# Patient Record
Sex: Male | Born: 1935
Health system: Southern US, Community
[De-identification: ages and names within clinical notes are randomized; demographics above are authoritative.]

## PROBLEM LIST (undated history)

## (undated) DIAGNOSIS — I10 Essential (primary) hypertension: Secondary | ICD-10-CM

## (undated) DIAGNOSIS — D649 Anemia, unspecified: Secondary | ICD-10-CM

## (undated) DIAGNOSIS — E785 Hyperlipidemia, unspecified: Secondary | ICD-10-CM

## (undated) DIAGNOSIS — Z8546 Personal history of malignant neoplasm of prostate: Secondary | ICD-10-CM

## (undated) DIAGNOSIS — I493 Ventricular premature depolarization: Secondary | ICD-10-CM

## (undated) HISTORY — DX: Hyperlipidemia, unspecified: E78.5

## (undated) HISTORY — DX: Ventricular premature depolarization: I49.3

## (undated) HISTORY — DX: Essential (primary) hypertension: I10

## (undated) HISTORY — DX: Personal history of malignant neoplasm of prostate: Z85.46

## (undated) HISTORY — PX: PROSTATE SURGERY: SHX751

## (undated) HISTORY — PX: HERNIA REPAIR: SHX51

---

## 1992-01-14 DIAGNOSIS — Z8546 Personal history of malignant neoplasm of prostate: Secondary | ICD-10-CM

## 1992-01-14 HISTORY — DX: Personal history of malignant neoplasm of prostate: Z85.46

## 2002-03-24 ENCOUNTER — Ambulatory Visit (HOSPITAL_COMMUNITY): Admission: RE | Admit: 2002-03-24 | Discharge: 2002-03-24 | Payer: Self-pay | Admitting: Gastroenterology

## 2002-03-24 ENCOUNTER — Encounter (INDEPENDENT_AMBULATORY_CARE_PROVIDER_SITE_OTHER): Payer: Self-pay | Admitting: *Deleted

## 2003-05-24 ENCOUNTER — Ambulatory Visit (HOSPITAL_COMMUNITY): Admission: RE | Admit: 2003-05-24 | Discharge: 2003-05-24 | Payer: Self-pay | Admitting: Family Medicine

## 2003-08-16 ENCOUNTER — Ambulatory Visit (HOSPITAL_COMMUNITY): Admission: RE | Admit: 2003-08-16 | Discharge: 2003-08-16 | Payer: Self-pay | Admitting: Family Medicine

## 2004-02-26 ENCOUNTER — Ambulatory Visit (HOSPITAL_COMMUNITY): Admission: RE | Admit: 2004-02-26 | Discharge: 2004-02-26 | Payer: Self-pay | Admitting: Family Medicine

## 2004-08-27 ENCOUNTER — Ambulatory Visit (HOSPITAL_COMMUNITY): Admission: RE | Admit: 2004-08-27 | Discharge: 2004-08-27 | Payer: Self-pay | Admitting: Family Medicine

## 2005-10-03 ENCOUNTER — Ambulatory Visit (HOSPITAL_COMMUNITY): Admission: RE | Admit: 2005-10-03 | Discharge: 2005-10-03 | Payer: Self-pay | Admitting: Family Medicine

## 2007-08-25 ENCOUNTER — Ambulatory Visit: Payer: Self-pay | Admitting: Cardiology

## 2008-01-21 ENCOUNTER — Encounter: Payer: Self-pay | Admitting: Internal Medicine

## 2010-02-03 ENCOUNTER — Encounter: Payer: Self-pay | Admitting: Family Medicine

## 2010-05-28 NOTE — Assessment & Plan Note (Signed)
[facility]                            CARDIOLOGY OFFICE NOTE   NAME:[place], [place]                      MRN:          [id number]  DATE:08/25/2007                            DOB:          10-01-1928    PRIMARY CARE PHYSICIAN:  Ernestina Penna, MD   REASON FOR PRESENTATION:  Evaluate the patient with premature  ventricular contractions.   HISTORY OF PRESENT ILLNESS:  The patient is a very pleasant 77 year old  gentleman who looks much younger than his stated age.  He has had no  significant cardiac history in the past.  He has had stress exercise  tests, which have been normal.  He walks a mile every day.  He remains  active, pushing a Surveyor, mining.  With all of these activities, he denies  any chest pressure, neck discomfort, arm discomfort, activity-induced  nausea, vomiting, or excessive diaphoresis.  He has no palpitations,  presyncope, or syncope.  He has had no PND or orthopnea.   He was noted recently on EKG had premature ventricular contractions.  This was a routine EKG, and he has not have any symptoms which related  to this.   PAST MEDICAL HISTORY:  Hypertension x25 years, and hyperlipidemia.   PAST SURGICAL HISTORY:  Prostate cancer in 1994.   ALLERGIES:  None.   MEDICATIONS:  1. Lipitor 10 mg daily.  2. Amlodipine/benazepril 5/20.  3. Hydrochlorothiazide 25 mg daily.  4. TriCor 48 mg daily.  5. Vitamin D.  6. Aspirin 81 mg every other day.   SOCIAL HISTORY:  The patient is married.  He has 2 children.  He is  retired.  He does not smoke cigarettes.  He does not drink alcohol.   FAMILY HISTORY:  Noncontributory for early coronary artery disease.   REVIEW OF SYSTEMS:  As stated in the HPI and positive for some joint  pain.  Negative for all other systems.   PHYSICAL EXAMINATION:  GENERAL:  The patient is well-appearing and in no  distress.  He is pleasant.  VITAL SIGNS:  Blood pressure 140/80, heart rate 70 and regular,  weight  171 pounds, and body mass index 23.  HEENT:  Eyes are unremarkable.  Pupils are equal, round, and reactive to  light.  Fundi not visualized.  Oral mucosa unremarkable.  NECK:  No jugular venous distention at 45 degrees.  Carotid upstroke  brisk and symmetrical.  No bruits.  No thyromegaly.  LYMPHATICS:  No cervical, axillary, or inguinal adenopathy.  LUNGS:  Clear to auscultation bilaterally.  BACK:  No costovertebral angle tenderness.  CHEST:  Unremarkable.  HEART:  PMI not displaced or sustained.  S1 and S2 within normal limits.  No S3.  No S4.  No clicks.  No rubs.  No murmurs.  ABDOMEN:  Flat.  Positive bowel sounds.  Normal in frequency and pitch.  No bruits.  No rebound.  No guarding.  No midline pulsatile mass.  No  hepatomegaly.  No splenomegaly.  SKIN:  No rashes.  No nodules.  EXTREMITIES:  2+ pulses throughout.  No edema.  No cyanosis.  No  clubbing.  NEURO:  Oriented to person, place, and time.  Cranial nerves II through  XII are grossly intact.  Motor grossly intact.   EKG:  Sinus rhythm, left axis deviation, left anterior fascicular block.  Incomplete right bundle-branch block.  No acute ST-T wave changes.   ASSESSMENT AND PLAN:  1. Premature ventricular contractions.  The patient has premature      ventricular contractions documented on previous EKGs.  However, he      is not having any symptoms related to this.  At this point, no      further cardiovascular testing is suggested.  2. Hypertension.  Blood pressure is well controlled and he will      continue the medications as listed.  3. Dyslipidemia.  He is followed closely by Dr. Christell Constant.  4. Followup.  I will see this patient back as needed based on future      symptoms.     Rollene Rotunda, MD, [facility]  Electronically Signed    JH/MedQ  DD: 08/25/2007  DT: 08/26/2007  Job #: [id number]   cc:   Ernestina Penna, M.D.

## 2010-05-31 NOTE — Op Note (Signed)
   Benjamin [place]anan, [place] Buchanan                         ACCOUNT NO.:  [id number]   MEDICAL RECORD NO.:  [id number]                   PATIENT TYPE:  AMB   LOCATION:  ENDO                                 FACILITY:  [facility]   PHYSICIAN:  John C. Madilyn Fireman, M.D.                 DATE OF BIRTH:  1928/03/28   DATE OF PROCEDURE:  03/24/2002  DATE OF DISCHARGE:                                 OPERATIVE REPORT   PROCEDURE:  Colonoscopy.   INDICATIONS FOR PROCEDURE:  Colon cancer screening.   DESCRIPTION OF PROCEDURE:  The patient was placed in the left lateral  decubitus position then placed on the pulse monitor with continuous low flow  oxygen delivered by nasal cannula. He was sedated with 100 mcg IV fentanyl  and 8 mg IV Versed. The Olympus video colonoscope was inserted into the  rectum and advanced to the cecum, confirmed by transillumination at  McBurney's point and visualization of the ileocecal valve and appendiceal  orifice. The prep was excellent. In the base of the cecum, there was an 8 mm  sessile polyp which was removed by snare. The remainder of the cecum,  ascending, transverse, descending and sigmoid colon all appeared normal with  no masses, polyps, diverticula or other mucosal abnormalities. The rectum  likewise appeared normal and retroflexed view of the anus revealed no  obvious internal hemorrhoids. The colonoscope was then withdrawn and the  patient returned to the recovery room in stable condition. The patient  tolerated the procedure well and there were no immediate complications.   IMPRESSION:  Cecal polyp, otherwise, normal colonoscopy.   PLAN:  Await biopsy results to determine method and interval for future  colon screening.                                               John C. Madilyn Fireman, M.D.    JCH/MEDQ  D:  03/24/2002  T:  03/24/2002  Job:  [id number]   cc:   Ernestina Penna, M.D.  [address] [place]  [place] [address]  Fax: [phone]

## 2010-12-12 ENCOUNTER — Other Ambulatory Visit: Payer: Self-pay | Admitting: Gastroenterology

## 2012-01-16 ENCOUNTER — Encounter: Payer: Self-pay | Admitting: *Deleted

## 2012-01-20 ENCOUNTER — Encounter: Payer: Self-pay | Admitting: Cardiology

## 2012-01-20 ENCOUNTER — Ambulatory Visit (INDEPENDENT_AMBULATORY_CARE_PROVIDER_SITE_OTHER): Payer: Medicare Other | Admitting: Cardiology

## 2012-01-20 VITALS — BP 124/74 | HR 70 | Ht 71.0 in | Wt 162.0 lb

## 2012-01-20 DIAGNOSIS — E782 Mixed hyperlipidemia: Secondary | ICD-10-CM | POA: Insufficient documentation

## 2012-01-20 DIAGNOSIS — I493 Ventricular premature depolarization: Secondary | ICD-10-CM | POA: Insufficient documentation

## 2012-01-20 DIAGNOSIS — I10 Essential (primary) hypertension: Secondary | ICD-10-CM

## 2012-01-20 DIAGNOSIS — E785 Hyperlipidemia, unspecified: Secondary | ICD-10-CM

## 2012-01-20 DIAGNOSIS — Z9189 Other specified personal risk factors, not elsewhere classified: Secondary | ICD-10-CM

## 2012-01-20 DIAGNOSIS — I4949 Other premature depolarization: Secondary | ICD-10-CM

## 2012-01-20 NOTE — Patient Instructions (Addendum)
Your physician recommends that you have  a FASTING lipid profile/BMET. Please fax the results to Dr Shirlee Latch [phone].   Your physician wants you to follow-up in: 1-2 years with Dr Shirlee Latch. You will receive a reminder letter in the mail two months in advance. If you don't receive a letter, please call our office to schedule the follow-up appointment.

## 2012-01-20 NOTE — Progress Notes (Signed)
[name], male   DOB: 03-Dec-1928, 79 y.o.   MRN: [id number] PCP: Dr. Christell Constant  79 yo with history of HTN and hyperlipidemia presents for cardiac risk evaluation.  Patient had a prior cardiac evaluation about 4 years ago for PVCs that were determined to be probably benign.  He has had no recent palpitations, lightheadedness, or syncope.  He is active, walking daily for exercise.  No exertional dyspnea or chest pain.  He can walk up and down the stairs to his basement without any problems.  He is on benazepril and HCTZ for HTN.  His BP has been under good control at home (brings BP numbers from the last couple of weeks at home).  He is on lovastatin and will get lipids done with Dr. Christell Constant in a couple of weeks.  He takes aspirin 3 times a week because of excessive bruising when he takes it daily.   ECG: NSR, normal  PMH: 1. PVCs 2. HTN 3. Hyperlipidemia 4. Prostate cancer in 1994  SH: Married, 2 children, nonsmoker.  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  Lives in [place].   FH: No CAD.  Mother with Alzheimers and father with prostate cancer .  ROS: All systems reviewed and negative as per HPI.   Current Outpatient Prescriptions  Medication Sig Dispense Refill  . amLODipine (NORVASC) 5 MG tablet Take 1 tablet by mouth Daily.      Marland Kitchen aspirin 81 MG tablet Take 81 mg by mouth. Monday, Wednesday, Friday      . benazepril (LOTENSIN) 20 MG tablet Take 1 tablet by mouth Daily.      . Cholecalciferol 1000 UNITS capsule Take 1,000 Units by mouth daily.      . fenofibrate 54 MG tablet Take 1 tablet by mouth Daily.      . fish oil-omega-3 fatty acids 1000 MG capsule Take 1 g by mouth daily.      . hydrochlorothiazide (HYDRODIURIL) 25 MG tablet Take 1 tablet by mouth Daily.      Marland Kitchen lovastatin (MEVACOR) 20 MG tablet Take 1 tablet by mouth Daily.      . vitamin C (ASCORBIC ACID) 500 MG tablet Take 500 mg by mouth daily.       BP 124/74  Pulse 70  Ht 5\' 11"  (1.803 m)   Wt 162 lb (73.483 kg)  BMI 22.59 kg/m2 General: NAD Neck: No JVD, no thyromegaly or thyroid nodule.  Lungs: Clear to auscultation bilaterally with normal respiratory effort. CV: Nondisplaced PMI.  Heart regular S1/S2, no S3/S4, no murmur.  No peripheral edema.  No carotid bruit.  Normal pedal pulses.  Abdomen: Soft, nontender, no hepatosplenomegaly, no distention.  Skin: Intact without lesions or rashes.  Neurologic: Alert and oriented x 3.  Psych: Normal affect. Extremities: No clubbing or cyanosis.  HEENT: Normal.   Assessment/Plan: 1. HTN: BP is under good control.  Continue current regimen.  2. Hyperlipidemia: Will ask that a copy of labs be sent to [place] when he gets them at Dr. Kathi Der office later this month.  3. PVCs: Normal ECG today.  No palpitations.  4. Cardiovascular risk evaluation: No ischemic symptoms.  He seems to be doing well overall.  I asked him to try to take ASA more often, he will up it to 4 times a week.  He can followup in 1-2 years or as needed.   Marca Ancona 01/20/2012 3:55 PM

## 2012-02-06 ENCOUNTER — Encounter: Payer: Self-pay | Admitting: Cardiology

## 2012-02-09 ENCOUNTER — Telehealth: Payer: Self-pay | Admitting: Cardiology

## 2012-02-09 NOTE — Telephone Encounter (Signed)
°  New Problem      Returning phone call

## 2012-02-09 NOTE — Telephone Encounter (Signed)
Spoke with pt about recent lab

## 2012-04-01 ENCOUNTER — Other Ambulatory Visit: Payer: Self-pay | Admitting: Family Medicine

## 2012-09-02 ENCOUNTER — Other Ambulatory Visit (INDEPENDENT_AMBULATORY_CARE_PROVIDER_SITE_OTHER): Payer: Medicare Other

## 2012-09-02 DIAGNOSIS — I10 Essential (primary) hypertension: Secondary | ICD-10-CM

## 2012-09-02 DIAGNOSIS — E559 Vitamin D deficiency, unspecified: Secondary | ICD-10-CM

## 2012-09-02 DIAGNOSIS — R5381 Other malaise: Secondary | ICD-10-CM

## 2012-09-02 DIAGNOSIS — E785 Hyperlipidemia, unspecified: Secondary | ICD-10-CM

## 2012-09-02 LAB — POCT CBC
Granulocyte percent: 67.7 %G (ref 37–80)
Hemoglobin: 13.3 g/dL — AB (ref 14.1–18.1)
MCH, POC: 30.8 pg (ref 27–31.2)
POC Granulocyte: 4.5 (ref 2–6.9)
POC LYMPH PERCENT: 26.3 %L (ref 10–50)
RBC: 4.3 M/uL — AB (ref 4.69–6.13)

## 2012-09-02 NOTE — Progress Notes (Signed)
Patient here today for labs only. °

## 2012-09-03 LAB — HEPATIC FUNCTION PANEL
Albumin: 4.4 g/dL (ref 3.5–4.7)
Total Bilirubin: 0.9 mg/dL (ref 0.0–1.2)
Total Protein: 6.4 g/dL (ref 6.0–8.5)

## 2012-09-03 LAB — NMR, LIPOPROFILE
LDL Size: 20.8 nm (ref >20.5)
LDLC SERPL CALC-MCNC: 60 mg/dL (ref <100)
LP-IR Score: 56 — ABNORMAL HIGH (ref <=45)
Triglycerides by NMR: 106 mg/dL (ref <150)

## 2012-09-03 LAB — BMP8+EGFR
BUN: 21 mg/dL (ref 8–27)
Creatinine, Ser: 1.52 mg/dL — ABNORMAL HIGH (ref 0.76–1.27)
GFR calc Af Amer: 49 mL/min/{1.73_m2} — ABNORMAL LOW (ref >59)
GFR calc non Af Amer: 42 mL/min/{1.73_m2} — ABNORMAL LOW (ref >59)
Glucose: 96 mg/dL (ref 65–99)

## 2012-09-06 ENCOUNTER — Ambulatory Visit (INDEPENDENT_AMBULATORY_CARE_PROVIDER_SITE_OTHER): Payer: Medicare Other | Admitting: Family Medicine

## 2012-09-06 ENCOUNTER — Encounter: Payer: Self-pay | Admitting: Family Medicine

## 2012-09-06 VITALS — BP 133/71 | HR 70 | Temp 97.2°F | Ht 70.5 in | Wt 161.2 lb

## 2012-09-06 DIAGNOSIS — B351 Tinea unguium: Secondary | ICD-10-CM

## 2012-09-06 DIAGNOSIS — J309 Allergic rhinitis, unspecified: Secondary | ICD-10-CM

## 2012-09-06 DIAGNOSIS — E785 Hyperlipidemia, unspecified: Secondary | ICD-10-CM

## 2012-09-06 DIAGNOSIS — I10 Essential (primary) hypertension: Secondary | ICD-10-CM

## 2012-09-06 DIAGNOSIS — Z8546 Personal history of malignant neoplasm of prostate: Secondary | ICD-10-CM

## 2012-09-06 DIAGNOSIS — E559 Vitamin D deficiency, unspecified: Secondary | ICD-10-CM

## 2012-09-06 DIAGNOSIS — R799 Abnormal finding of blood chemistry, unspecified: Secondary | ICD-10-CM

## 2012-09-06 DIAGNOSIS — R7989 Other specified abnormal findings of blood chemistry: Secondary | ICD-10-CM

## 2012-09-06 NOTE — Patient Instructions (Addendum)
Fall precautions discussed Continue current meds and therapeutic lifestyle changes Return to clinic in September or October for a flu shot Drink plenty of fluids and keep well hydrated Continue to walk regularly as this will help arthritic complaints Take Tylenol as needed for arthritis pain Wear protection for nasal passages and throat when working outside Use cortisone cream over-the-counter for rash on left arm

## 2012-09-06 NOTE — Progress Notes (Signed)
Subjective:    [name], male    DOB: Aug 16, 1928, 79 y.o.   MRN: [id number]  HPI Patient comes in today for followup and management of chronic medical problems. He has a history of hypertension hyperlipidemia and vitamin D deficiency. He also has a history of remote prostate cancer. Outside blood pressures were reviewed and all these are excellent. Blood pressures are running from 110-118/60-70. He is also up-to-date on all of his health maintenance issues. See also the review of systems. His recent labs were reviewed.   Review of Systems  Constitutional: Negative.  Negative for activity change, appetite change and fatigue.  HENT: Negative.  Negative for ear pain, congestion, sore throat, rhinorrhea, sneezing and tinnitus.   Eyes: Negative.  Negative for pain, redness, itching and visual disturbance.  Respiratory: Positive for cough (dry in the AM). Negative for choking, chest tightness and wheezing.   Cardiovascular: Negative.  Negative for chest pain, palpitations and leg swelling.  Gastrointestinal: Negative.  Negative for nausea, vomiting, abdominal pain, constipation and blood in stool.  Endocrine: Negative.  Negative for cold intolerance, heat intolerance, polydipsia, polyphagia and polyuria.  Genitourinary: Negative for dysuria, urgency, frequency and hematuria.  Musculoskeletal: Positive for arthralgias (L hand). Negative for myalgias and back pain.  Skin: Negative for color change, pallor, rash and wound.  Allergic/Immunologic: Negative.  Negative for environmental allergies.  Neurological: Negative for dizziness, tremors, weakness, light-headedness, numbness and headaches.  Psychiatric/Behavioral: Positive for decreased concentration (slight memory deficit). Negative for confusion, sleep disturbance and agitation. The patient is not nervous/anxious.        Objective:   Physical Exam BP 133/71  Pulse 70  Temp(Src) 97.2 F (36.2 C) (Oral)  Ht 5' 10.5" (1.791  m)  Wt 161 lb 3.2 oz (73.12 kg)  BMI 22.8 kg/m2  The patient appeared well nourished and normally developed for his age, alert and oriented to time and place. Speech, behavior and judgement appear normal. Vital signs as documented.  Head exam is unremarkable. No scleral icterus or pallor noted. There is some nasal congestion bilaterally. There was minimal cerumen in both ear canals. The eardrums appeared normal. The mouth and throat were normal. Neck is without jugular venous distension, thyromegally, or carotid bruits. Carotid upstrokes are brisk bilaterally. No cervical adenopathy. Lungs are clear anteriorly and posteriorly to auscultation. Normal respiratory effort. Cardiac exam reveals regular rate and rhythm at 72 per minute. First and second heart sounds normal.  No murmurs, rubs or gallops. There were no PVCs. Abdominal exam reveals normal bowl sounds, no masses, no organomegaly and no aortic enlargement. No inguinal adenopathy. Extremities are nonedematous and both femoral and pedal pulses are normal. There were a lot of varicosities around the ankles. Skin without pallor or jaundice.  Warm and dry, without rash. He had a toenail fungus. There was erythema and redness around the lateral epicondyles the left elbow. Neurologic exam reveals normal deep tendon reflexes and normal sensation.          Assessment & Plan:  1. Hypertension  2. Hyperlipemia  3. Vitamin D deficiency  4. History of prostate cancer  5. Elevated serum creatinine  6. Nail fungus  7. Allergic rhinitis  Patient Instructions  Fall precautions discussed Continue current meds and therapeutic lifestyle changes Return to clinic in September or October for a flu shot Drink plenty of fluids and keep well hydrated Continue to walk regularly as this will help arthritic complaints Take Tylenol as needed for arthritis pain Wear protection  for nasal passages and throat when working outside Use cortisone cream  over-the-counter for rash on left arm   Nyra Capes MD

## 2012-09-26 ENCOUNTER — Other Ambulatory Visit: Payer: Self-pay | Admitting: Family Medicine

## 2012-09-27 NOTE — Telephone Encounter (Signed)
Last seen 03/08/12  MMM

## 2012-10-07 ENCOUNTER — Other Ambulatory Visit: Payer: Self-pay | Admitting: Nurse Practitioner

## 2012-11-03 ENCOUNTER — Other Ambulatory Visit: Payer: Self-pay | Admitting: Family Medicine

## 2012-11-05 ENCOUNTER — Other Ambulatory Visit: Payer: Self-pay | Admitting: Nurse Practitioner

## 2012-11-16 ENCOUNTER — Ambulatory Visit (INDEPENDENT_AMBULATORY_CARE_PROVIDER_SITE_OTHER): Payer: Medicare Other | Admitting: Family Medicine

## 2012-11-16 VITALS — BP 135/73 | HR 79 | Temp 96.5°F | Ht 70.5 in | Wt 161.0 lb

## 2012-11-16 DIAGNOSIS — J069 Acute upper respiratory infection, unspecified: Secondary | ICD-10-CM

## 2012-11-16 MED ORDER — AZITHROMYCIN 250 MG PO TABS: ORAL_TABLET | ORAL | Status: DC

## 2012-11-16 MED ORDER — HYDROCODONE-HOMATROPINE 5-1.5 MG/5ML PO SYRP: 5.0000 mL | ORAL_SOLUTION | Freq: Three times a day (TID) | ORAL | Status: DC | PRN

## 2012-11-16 NOTE — Patient Instructions (Signed)

## 2012-11-16 NOTE — Progress Notes (Signed)
  Subjective:    Patient ID: CURTIS CAIN, male    DOB: 1928/12/05, 79 y.o.   MRN: [id number]  HPI This 79 y.o. male presents for evaluation of cough and uri sx's for the last few days..  Review of Systems    No chest pain, SOB, HA, dizziness, vision change, N/V, diarrhea, constipation, dysuria, urinary urgency or frequency, myalgias, arthralgias or rash.  Objective:   Physical Exam Vital signs noted  Well developed well nourished male.  HEENT - Head atraumatic Normocephalic                Eyes - PERRLA, Conjuctiva - clear Sclera- Clear EOMI                Ears - EAC's Wnl TM's Wnl Gross Hearing WNL                Nose - Nares patent                 Throat - oropharanx wnl Respiratory - Lungs CTA bilateral Cardiac - RRR S1 and S2 without murmur GI - Abdomen soft Nontender and bowel sounds active x 4 Extremities - No edema. Neuro - Grossly intact.       Assessment & Plan:  URI (upper respiratory infection) - Plan: azithromycin (ZITHROMAX) 250 MG tablet, HYDROcodone-homatropine (HYCODAN) 5-1.5 MG/5ML syrup Push po fluids, rest, and follow up prn.  Deatra Canter FNP

## 2012-12-14 ENCOUNTER — Ambulatory Visit (INDEPENDENT_AMBULATORY_CARE_PROVIDER_SITE_OTHER): Payer: Medicare Other | Admitting: *Deleted

## 2012-12-14 DIAGNOSIS — Z23 Encounter for immunization: Secondary | ICD-10-CM

## 2012-12-15 ENCOUNTER — Other Ambulatory Visit: Payer: Self-pay | Admitting: *Deleted

## 2012-12-15 DIAGNOSIS — I493 Ventricular premature depolarization: Secondary | ICD-10-CM

## 2012-12-15 DIAGNOSIS — Z9189 Other specified personal risk factors, not elsewhere classified: Secondary | ICD-10-CM

## 2012-12-15 DIAGNOSIS — I10 Essential (primary) hypertension: Secondary | ICD-10-CM

## 2012-12-15 DIAGNOSIS — E785 Hyperlipidemia, unspecified: Secondary | ICD-10-CM

## 2012-12-15 MED ORDER — FENOFIBRATE 54 MG PO TABS: 54.0000 mg | ORAL_TABLET | Freq: Every day | ORAL | Status: DC

## 2012-12-23 ENCOUNTER — Other Ambulatory Visit: Payer: Self-pay | Admitting: Family Medicine

## 2013-01-27 ENCOUNTER — Other Ambulatory Visit: Payer: Self-pay | Admitting: Family Medicine

## 2013-02-10 ENCOUNTER — Encounter: Payer: Self-pay | Admitting: Cardiology

## 2013-02-10 ENCOUNTER — Ambulatory Visit (INDEPENDENT_AMBULATORY_CARE_PROVIDER_SITE_OTHER): Payer: Medicare Other | Admitting: Cardiology

## 2013-02-10 VITALS — BP 130/70 | HR 66 | Ht 70.5 in | Wt 160.0 lb

## 2013-02-10 DIAGNOSIS — E785 Hyperlipidemia, unspecified: Secondary | ICD-10-CM

## 2013-02-10 DIAGNOSIS — Z9189 Other specified personal risk factors, not elsewhere classified: Secondary | ICD-10-CM

## 2013-02-10 DIAGNOSIS — I4949 Other premature depolarization: Secondary | ICD-10-CM

## 2013-02-10 DIAGNOSIS — I493 Ventricular premature depolarization: Secondary | ICD-10-CM

## 2013-02-10 DIAGNOSIS — I10 Essential (primary) hypertension: Secondary | ICD-10-CM

## 2013-02-10 NOTE — Patient Instructions (Signed)
Your physician wants you to follow-up in: 1 year with Dr McLean. (January 2016).  You will receive a reminder letter in the mail two months in advance. If you don't receive a letter, please call our office to schedule the follow-up appointment.  

## 2013-02-10 NOTE — Progress Notes (Signed)
[name], male   DOB: 09-21-1928, 80 y.o.   MRN: [id number] PCP: Dr. Laurance Flatten  80 yo with history of HTN, PVCs, and hyperlipidemia presents for cardiac followup.  He has had no recent palpitations, lightheadedness, or syncope.  He is active, walking daily for exercise.  No exertional dyspnea or chest pain.  He can walk up and down the stairs to his basement without any problems.  He is on benazepril and HCTZ for HTN.  His BP has been under good control.  He takes aspirin 4 times a week because of excessive bruising when he takes it daily. Weight is down 2 lbs compared to last year.   ECG: NSR, 1st degree AV block, iRBBB  Labs (8/14): LDL-P 1160, LDL 60, K 4.6, creatinine 1.5  PMH: 1. PVCs 2. HTN 3. Hyperlipidemia 4. Prostate cancer in 1994 5. CKD  SH: Married, 2 children, nonsmoker.  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  [place]ives in [place].   FH: No CAD.  Mother with Alzheimers and father with prostate cancer .  Current Outpatient Prescriptions  Medication Sig Dispense Refill  . amLODipine (NORVASC) 5 MG tablet TAKE 1 TABLET BY MOUTH ONCE A DAY  30 tablet  3  . aspirin 81 MG tablet Take 81 mg by mouth. Monday, Wednesday, Friday      . azithromycin (ZITHROMAX) 250 MG tablet Take 2 po first day and then one po qd x 4 days  6 tablet  0  . benazepril (LOTENSIN) 20 MG tablet TAKE 1 TABLET EVERY DAY  30 tablet  3  . Cholecalciferol 1000 UNITS capsule Take 1,000 Units by mouth daily.      . fenofibrate 54 MG tablet Take 1 tablet (54 mg total) by mouth daily.  30 tablet  2  . fish oil-omega-3 fatty acids 1000 MG capsule Take 1 g by mouth daily.      . hydrochlorothiazide (HYDRODIURIL) 25 MG tablet TAKE 1 TABLET BY MOUTH ONCE A DAY  30 tablet  1  . HYDROcodone-homatropine (HYCODAN) 5-1.5 MG/5ML syrup Take 5 mLs by mouth every 8 (eight) hours as needed for cough.  120 mL  0  . lovastatin (MEVACOR) 20 MG tablet TAKE 1 TABLET NOW, THEN TAKE 1 TABLET BY MOUTH ONCE  DAILY AS DIRECTED  30 tablet  1  . vitamin C (ASCORBIC ACID) 500 MG tablet Take 500 mg by mouth daily.       No current facility-administered medications for this visit.   BP 130/70  Pulse 66  Ht 5' 10.5" (1.791 m)  Wt 72.576 kg (160 lb)  BMI 22.63 kg/m2 General: NAD Neck: No JVD, no thyromegaly or thyroid nodule.  Lungs: Clear to auscultation bilaterally with normal respiratory effort. CV: Nondisplaced PMI.  Heart regular S1/S2, no S3/S4, no murmur.  No peripheral edema.  No carotid bruit.  Normal pedal pulses.  Abdomen: Soft, nontender, no hepatosplenomegaly, no distention.  Skin: Intact without lesions or rashes.  Neurologic: Alert and oriented x 3.  Psych: Normal affect. Extremities: No clubbing or cyanosis.   Assessment/Plan: 1. HTN: BP is under good control.  Continue current regimen.  2. Hyperlipidemia: Good lipids in 8/14. 3. PVCs:  No palpitations.  ECG today unremarkable.  4. Cardiovascular risk evaluation: No ischemic symptoms.  He seems to be doing well overall.  He will continue ASA 81 mg 4 x/week.  He can followup in 1 year or as needed.  5. CKD: Will be getting BMET  with physical in 2/15.    [name] 02/10/2013

## 2013-02-19 ENCOUNTER — Other Ambulatory Visit: Payer: Self-pay | Admitting: Family Medicine

## 2013-02-21 NOTE — Telephone Encounter (Signed)
Last seen 11/16/12 B [place]   Last lipid 09/02/12

## 2013-02-23 ENCOUNTER — Other Ambulatory Visit: Payer: Self-pay | Admitting: Family Medicine

## 2013-03-18 ENCOUNTER — Other Ambulatory Visit: Payer: Self-pay | Admitting: Family Medicine

## 2013-03-21 NOTE — Telephone Encounter (Signed)
Last seen 11/16/12  JWO  Last lipid 09/02/12

## 2013-03-22 ENCOUNTER — Other Ambulatory Visit: Payer: Self-pay | Admitting: Family Medicine

## 2013-03-31 ENCOUNTER — Other Ambulatory Visit: Payer: Self-pay | Admitting: Family Medicine

## 2013-04-07 ENCOUNTER — Other Ambulatory Visit: Payer: Self-pay | Admitting: Family Medicine

## 2013-05-18 ENCOUNTER — Encounter: Payer: Self-pay | Admitting: General Practice

## 2013-05-18 ENCOUNTER — Ambulatory Visit (INDEPENDENT_AMBULATORY_CARE_PROVIDER_SITE_OTHER): Payer: Medicare Other | Admitting: General Practice

## 2013-05-18 VITALS — BP 121/68 | HR 75 | Temp 97.5°F | Ht 70.5 in | Wt 161.8 lb

## 2013-05-18 DIAGNOSIS — L259 Unspecified contact dermatitis, unspecified cause: Secondary | ICD-10-CM

## 2013-05-18 MED ORDER — TRIAMCINOLONE ACETONIDE 0.1 % EX CREA
1.0000 | TOPICAL_CREAM | Freq: Two times a day (BID) | CUTANEOUS | Status: AC
Start: 2013-05-18 — End: 2013-05-25

## 2013-05-18 NOTE — Progress Notes (Signed)
   Subjective:    [name], male    DOB: [birth year], 80 y.o.   MRN: [id number]  Rash This is a new problem. The current episode started in the past 7 days. The problem has been gradually worsening since onset. The affected locations include the right hand and right arm. The rash is characterized by redness and itchiness. He was exposed to plant contact. Pertinent negatives include no congestion, cough, diarrhea, fever, shortness of breath or vomiting. Past treatments include anti-itch cream. The treatment provided mild relief. There is no history of allergies, asthma or eczema.      Review of Systems  Constitutional: Negative for fever and chills.  HENT: Negative for congestion.   Respiratory: Negative for cough, chest tightness and shortness of breath.   Cardiovascular: Negative for chest pain and palpitations.  Gastrointestinal: Negative for vomiting and diarrhea.  Skin: Positive for rash.       Red itchy rash to right hand and right forearm       Objective:   Physical Exam  Constitutional: He is oriented to person, place, and time. He appears well-developed and well-nourished.  Cardiovascular: Normal rate, regular rhythm and normal heart sounds.   Pulmonary/Chest: Effort normal and breath sounds normal. No respiratory distress. He exhibits no tenderness.  Neurological: He is alert and oriented to person, place, and time.  Skin: Skin is warm and dry. Rash noted. There is erythema.  Maculopapular rash with well-demarcated lines noted to right hand between index and middle finger. Also to right lower arm.   Psychiatric: He has a normal mood and affect.          Assessment & Plan:  1. Contact dermatitis - triamcinolone cream (KENALOG) 0.1 %; Apply 1 application topically 2 (two) times daily.  Dispense: 30 g; Refill: 0 -patient education provided and discussed -avoid irritants -RTO prn  -Patient verbalized understanding Erby Pian, FNP-C

## 2013-05-18 NOTE — Patient Instructions (Signed)

## 2013-05-29 ENCOUNTER — Other Ambulatory Visit: Payer: Self-pay | Admitting: Family Medicine

## 2013-05-30 NOTE — Telephone Encounter (Signed)
Last seen 08/2012 

## 2013-06-13 ENCOUNTER — Other Ambulatory Visit: Payer: Self-pay | Admitting: Family Medicine

## 2013-06-14 NOTE — Telephone Encounter (Signed)
Last seen you 08/2012

## 2013-07-18 ENCOUNTER — Other Ambulatory Visit (INDEPENDENT_AMBULATORY_CARE_PROVIDER_SITE_OTHER): Payer: Medicare Other

## 2013-07-18 DIAGNOSIS — E785 Hyperlipidemia, unspecified: Secondary | ICD-10-CM

## 2013-07-18 DIAGNOSIS — E559 Vitamin D deficiency, unspecified: Secondary | ICD-10-CM

## 2013-07-18 DIAGNOSIS — I10 Essential (primary) hypertension: Secondary | ICD-10-CM

## 2013-07-18 LAB — POCT CBC
GRANULOCYTE PERCENT: 70.8 % (ref 37–80)
HCT, POC: 37.3 % — AB (ref 43.5–53.7)
Hemoglobin: 12.3 g/dL — AB (ref 14.1–18.1)
LYMPH, POC: 1.8 (ref 0.6–3.4)
MCH, POC: 30.4 pg (ref 27–31.2)
MCHC: 32.8 g/dL (ref 31.8–35.4)
MCV: 92.6 fL (ref 80–97)
MPV: 8.5 fL (ref 0–99.8)
PLATELET COUNT, POC: 269 10*3/uL (ref 142–424)
POC GRANULOCYTE: 4.9 (ref 2–6.9)
POC LYMPH PERCENT: 25.6 %L (ref 10–50)
RBC: 4 M/uL — AB (ref 4.69–6.13)
RDW, POC: 12 %
WBC: 6.9 10*3/uL (ref 4.6–10.2)

## 2013-07-19 ENCOUNTER — Ambulatory Visit (INDEPENDENT_AMBULATORY_CARE_PROVIDER_SITE_OTHER): Payer: Medicare Other | Admitting: Family Medicine

## 2013-07-19 ENCOUNTER — Encounter: Payer: Self-pay | Admitting: Family Medicine

## 2013-07-19 ENCOUNTER — Other Ambulatory Visit: Payer: Self-pay | Admitting: Family Medicine

## 2013-07-19 VITALS — BP 125/63 | HR 71 | Temp 96.9°F | Ht 70.5 in | Wt 160.6 lb

## 2013-07-19 DIAGNOSIS — R7989 Other specified abnormal findings of blood chemistry: Secondary | ICD-10-CM

## 2013-07-19 DIAGNOSIS — M79609 Pain in unspecified limb: Secondary | ICD-10-CM

## 2013-07-19 DIAGNOSIS — M79669 Pain in unspecified lower leg: Secondary | ICD-10-CM

## 2013-07-19 DIAGNOSIS — R350 Frequency of micturition: Secondary | ICD-10-CM

## 2013-07-19 DIAGNOSIS — E785 Hyperlipidemia, unspecified: Secondary | ICD-10-CM

## 2013-07-19 DIAGNOSIS — Z8546 Personal history of malignant neoplasm of prostate: Secondary | ICD-10-CM

## 2013-07-19 DIAGNOSIS — I10 Essential (primary) hypertension: Secondary | ICD-10-CM

## 2013-07-19 DIAGNOSIS — H6123 Impacted cerumen, bilateral: Secondary | ICD-10-CM

## 2013-07-19 DIAGNOSIS — Z23 Encounter for immunization: Secondary | ICD-10-CM

## 2013-07-19 DIAGNOSIS — R710 Precipitous drop in hematocrit: Secondary | ICD-10-CM

## 2013-07-19 DIAGNOSIS — R799 Abnormal finding of blood chemistry, unspecified: Secondary | ICD-10-CM

## 2013-07-19 DIAGNOSIS — H6120 Impacted cerumen, unspecified ear: Secondary | ICD-10-CM

## 2013-07-19 LAB — BMP8+EGFR
BUN/Creatinine Ratio: 16 (ref 10–22)
BUN: 21 mg/dL (ref 8–27)
CALCIUM: 9.8 mg/dL (ref 8.6–10.2)
CHLORIDE: 103 mmol/L (ref 97–108)
CO2: 25 mmol/L (ref 18–29)
Creatinine, Ser: 1.34 mg/dL — ABNORMAL HIGH (ref 0.76–1.27)
GFR calc Af Amer: 56 mL/min/{1.73_m2} — ABNORMAL LOW (ref >59)
GFR calc non Af Amer: 49 mL/min/{1.73_m2} — ABNORMAL LOW (ref >59)
GLUCOSE: 96 mg/dL (ref 65–99)
POTASSIUM: 3.8 mmol/L (ref 3.5–5.2)
Sodium: 142 mmol/L (ref 134–144)

## 2013-07-19 LAB — POCT UA - MICROSCOPIC ONLY
Bacteria, U Microscopic: NEGATIVE
Casts, Ur, LPF, POC: NEGATIVE
Crystals, Ur, HPF, POC: NEGATIVE
Epithelial cells, urine per micros: NEGATIVE
Mucus, UA: NEGATIVE
RBC, URINE, MICROSCOPIC: NEGATIVE
WBC, UR, HPF, POC: NEGATIVE
YEAST UA: NEGATIVE

## 2013-07-19 LAB — POCT URINALYSIS DIPSTICK
Bilirubin, UA: NEGATIVE
Blood, UA: NEGATIVE
GLUCOSE UA: NEGATIVE
Ketones, UA: NEGATIVE
LEUKOCYTES UA: NEGATIVE
NITRITE UA: NEGATIVE
PROTEIN UA: NEGATIVE
Spec Grav, UA: 1.01
UROBILINOGEN UA: NEGATIVE
pH, UA: 5

## 2013-07-19 LAB — VITAMIN D 25 HYDROXY (VIT D DEFICIENCY, FRACTURES): Vit D, 25-Hydroxy: 37.8 ng/mL (ref 30.0–100.0)

## 2013-07-19 LAB — POCT CBC
GRANULOCYTE PERCENT: 68.8 % (ref 37–80)
HEMATOCRIT: 37.5 % — AB (ref 43.5–53.7)
Hemoglobin: 12.5 g/dL — AB (ref 14.1–18.1)
LYMPH, POC: 1.9 (ref 0.6–3.4)
MCH, POC: 31.2 pg (ref 27–31.2)
MCHC: 33.4 g/dL (ref 31.8–35.4)
MCV: 93.5 fL (ref 80–97)
MPV: 7.4 fL (ref 0–99.8)
PLATELET COUNT, POC: 294 10*3/uL (ref 142–424)
POC GRANULOCYTE: 5.5 (ref 2–6.9)
POC LYMPH PERCENT: 24 %L (ref 10–50)
RBC: 4 M/uL — AB (ref 4.69–6.13)
RDW, POC: 12.3 %
WBC: 8 10*3/uL (ref 4.6–10.2)

## 2013-07-19 LAB — NMR, LIPOPROFILE
CHOLESTEROL: 163 mg/dL (ref 100–199)
HDL Cholesterol by NMR: 49 mg/dL (ref >39)
HDL Particle Number: 37.1 umol/L (ref >=30.5)
LDL Particle Number: 982 nmol/L (ref <1000)
LDL SIZE: 21 nm (ref >20.5)
LDLC SERPL CALC-MCNC: 90 mg/dL (ref 0–99)
LP-IR SCORE: 51 — AB (ref <=45)
Small LDL Particle Number: 398 nmol/L (ref <=527)
TRIGLYCERIDES BY NMR: 122 mg/dL (ref 0–149)

## 2013-07-19 LAB — HEPATIC FUNCTION PANEL
ALBUMIN: 4.4 g/dL (ref 3.5–4.7)
ALT: 13 IU/L (ref 0–44)
AST: 19 IU/L (ref 0–40)
Alkaline Phosphatase: 43 IU/L (ref 39–117)
Bilirubin, Direct: 0.15 mg/dL (ref 0.00–0.40)
Total Bilirubin: 0.5 mg/dL (ref 0.0–1.2)
Total Protein: 6.2 g/dL (ref 6.0–8.5)

## 2013-07-19 NOTE — Addendum Note (Signed)
Addended by: Selmer Dominion on: 07/19/2013 02:24 PM   Modules accepted: Orders

## 2013-07-19 NOTE — Progress Notes (Signed)
Pt notified of labs at appt 

## 2013-07-19 NOTE — Progress Notes (Signed)
Subjective:    [name], male    DOB: [birth year], 80 y.o.   MRN: [id number]  HPI Pt is here today for a follow up of chronic medical problems to include hypertension and hyperlipidemia.  The patient is for his he brings in his home blood pressure readings and all of these were good and I will be scanned into the record. His are some slight fatigue and calf pain.  only complaintPrevnar vaccine and he will receive that today. He was also given FOBT to return.        Patient Active Problem List   Diagnosis Date Noted  . History of prostate cancer 09/06/2012  . HTN (hypertension) 01/20/2012  . Hyperlipidemia 01/20/2012  . Cardiovascular risk factor 01/20/2012  . PVC's (premature ventricular contractions) 01/20/2012   Outpatient Encounter Prescriptions as of 07/19/2013  Medication Sig  . amLODipine (NORVASC) 5 MG tablet TAKE 1 TABLET BY MOUTH EVERY DAY  . aspirin 81 MG tablet Take 81 mg by mouth. Monday, Wednesday, Friday  . benazepril (LOTENSIN) 20 MG tablet TAKE 1 TABLET EVERY DAY  . Cholecalciferol 1000 UNITS capsule Take 1,000 Units by mouth daily.  . fenofibrate 54 MG tablet TAKE 1 TABLET (54 MG TOTAL) BY MOUTH DAILY.  . fish oil-omega-3 fatty acids 1000 MG capsule Take 1 g by mouth daily.  . hydrochlorothiazide (HYDRODIURIL) 25 MG tablet TAKE 1 TABLET BY MOUTH EVERY DAY  . vitamin C (ASCORBIC ACID) 500 MG tablet Take 500 mg by mouth daily.  Marland Kitchen lovastatin (MEVACOR) 20 MG tablet TAKE 1 TABLET BY MOUTH EVERY DAY AS DIRECTED  . [DISCONTINUED] fenofibrate 54 MG tablet TAKE 1 TABLET (54 MG TOTAL) BY MOUTH DAILY.     Review of Systems  Constitutional: Positive for fatigue (slight).  HENT: Negative for congestion and postnasal drip.   Eyes: Negative.   Respiratory: Negative for cough and shortness of breath.   Cardiovascular: Negative.   Genitourinary: Negative.   Musculoskeletal: Positive for myalgias (bilateral calf pain).  Psychiatric/Behavioral: Negative.         Objective:   Physical Exam  Nursing note and vitals reviewed. Constitutional: He is oriented to person, place, and time. He appears well-developed and well-nourished. No distress.  HENT:  Head: Normocephalic and atraumatic.  Nose: Nose normal.  Mouth/Throat: Oropharynx is clear and moist. No oropharyngeal exudate.  Bilateral ear cerumen  Eyes: Conjunctivae and EOM are normal. Pupils are equal, round, and reactive to light. Right eye exhibits no discharge. Left eye exhibits no discharge. No scleral icterus.  Neck: Normal range of motion. Neck supple. No thyromegaly present.  Active history of cataracts  Cardiovascular: Normal rate, regular rhythm, normal heart sounds and intact distal pulses.  Exam reveals no gallop and no friction rub.   No murmur heard. At 72 per minute  Pulmonary/Chest: Effort normal and breath sounds normal. No respiratory distress. He has no wheezes. He has no rales. He exhibits no tenderness.  No axillary nodes  Abdominal: Soft. Bowel sounds are normal. He exhibits no mass. There is no tenderness. There is no rebound and no guarding.  No inguinal nodes  Genitourinary: Rectum normal and penis normal.  There were no inguinal hernias. There are no inguinal nodes. The rectal exam was negative for masses. The prostate vault was empty. This is secondary to his prostatectomy. The external genitalia were normal.  Musculoskeletal: Normal range of motion. He exhibits no edema and no tenderness.  Lymphadenopathy:    He has no cervical adenopathy.  Neurological: He is alert and oriented to person, place, and time. He has normal reflexes. No cranial nerve deficit.  Skin: Skin is warm and dry. No rash noted. No erythema. No pallor.  Psychiatric: He has a normal mood and affect. His behavior is normal. Judgment and thought content normal.   BP 125/63  Pulse 71  Temp(Src) 96.9 F (36.1 C) (Oral)  Ht 5' 10.5" (1.791 m)  Wt 160 lb 9.6 oz (72.848 kg)  BMI 22.71  kg/m2         Assessment & Plan:  1. Need for vaccination - Pneumococcal conjugate vaccine 13-valent  2. Drop in hemoglobin - POCT CBC; Future  3. Urinary frequency - POCT urinalysis dipstick - POCT UA - Microscopic Only  4. Impacted cerumen of both ears -Debrox eardrops over-the-counter as directed  5. History of prostate cancer -No more PSAs  6. Elevated serum creatinine -Continue to avoid NSAIDs  7. Essential hypertension -Continue current medication  8. Hyperlipemia -Due to calf pain patient will hold lovastatin for 4 weeks, he will then restart it on Monday Wednesday and Friday  9. Calf pain, unspecified laterality   Patient Instructions  Continue current medications. Continue good therapeutic lifestyle changes which include good diet and exercise. Fall precautions discussed with patient. If an FOBT was given today- please return it to our front desk. If you are over 51 years old - you may need Prevnar 48 or the adult Pneumonia vaccine. Use Debrox ear wax softener OTC Stop taking the Lovastatin for 4 weeks then restart taking on Monday, Wednesday and Friday. Take Tylenol for arthritis pain as needed.   Arrie Senate MD

## 2013-07-19 NOTE — Patient Instructions (Addendum)
Continue current medications. Continue good therapeutic lifestyle changes which include good diet and exercise. Fall precautions discussed with patient. If an FOBT was given today- please return it to our front desk. If you are over 78 years old - you may need Prevnar 68 or the adult Pneumonia vaccine. Use Debrox ear wax softener OTC Stop taking the Lovastatin for 4 weeks then restart taking on Monday, Wednesday and Friday. Take Tylenol for arthritis pain as needed.

## 2013-07-20 ENCOUNTER — Telehealth: Payer: Self-pay | Admitting: Family Medicine

## 2013-07-20 NOTE — Telephone Encounter (Signed)
Message copied by Waverly Ferrari on Wed Jul 20, 2013 11:38 AM ------      Message from: Chipper Herb      Created: Tue Jul 19, 2013  5:35 PM       The CBC had a normal white blood cell count. The hemoglobin was 12.5 and this is consistent with the reading from one day ago. He should still have a CBC repeated in 4 weeks.       he must return  the FOBT ------

## 2013-07-21 ENCOUNTER — Other Ambulatory Visit: Payer: Medicare Other

## 2013-07-21 DIAGNOSIS — Z1212 Encounter for screening for malignant neoplasm of rectum: Secondary | ICD-10-CM

## 2013-07-22 LAB — FECAL OCCULT BLOOD, IMMUNOCHEMICAL: FECAL OCCULT BLD: NEGATIVE

## 2013-08-02 ENCOUNTER — Other Ambulatory Visit: Payer: Self-pay | Admitting: Family Medicine

## 2013-08-08 ENCOUNTER — Encounter: Payer: Self-pay | Admitting: *Deleted

## 2013-08-15 ENCOUNTER — Other Ambulatory Visit: Payer: Self-pay | Admitting: Family Medicine

## 2013-08-16 ENCOUNTER — Other Ambulatory Visit: Payer: Self-pay | Admitting: Family Medicine

## 2013-08-16 ENCOUNTER — Other Ambulatory Visit (INDEPENDENT_AMBULATORY_CARE_PROVIDER_SITE_OTHER): Payer: Medicare Other

## 2013-08-16 DIAGNOSIS — R7989 Other specified abnormal findings of blood chemistry: Secondary | ICD-10-CM

## 2013-08-16 LAB — POCT CBC
Granulocyte percent: 71.2 %G (ref 37–80)
HCT, POC: 36.8 % — AB (ref 43.5–53.7)
Hemoglobin: 12.5 g/dL — AB (ref 14.1–18.1)
Lymph, poc: 1.6 (ref 0.6–3.4)
MCH, POC: 31.5 pg — AB (ref 27–31.2)
MCHC: 34.1 g/dL (ref 31.8–35.4)
MCV: 92.3 fL (ref 80–97)
MPV: 8.4 fL (ref 0–99.8)
POC Granulocyte: 4.6 (ref 2–6.9)
POC LYMPH PERCENT: 25.3 %L (ref 10–50)
Platelet Count, POC: 243 10*3/uL (ref 142–424)
RBC: 4 M/uL — AB (ref 4.69–6.13)
RDW, POC: 12 %
WBC: 6.5 10*3/uL (ref 4.6–10.2)

## 2013-08-25 ENCOUNTER — Telehealth: Payer: Self-pay | Admitting: *Deleted

## 2013-08-25 NOTE — Telephone Encounter (Signed)
Please discontinue the lovastatin. Wait 4 weeks and try Crestor 5 mg.------- please give samples for patient to try .

## 2013-08-25 NOTE — Telephone Encounter (Signed)
We are all out of samples Dr.Moore would you like for me to call this in?

## 2013-08-25 NOTE — Telephone Encounter (Signed)
Have the patient check with [place] in about 4 weeks and see if we have some samples at that time. If we do not we can call a prescription for him in then

## 2013-08-25 NOTE — Telephone Encounter (Signed)
Pt here with complaints of worsening myalgias due to Lovastatin Pt would like to try Crestor Please advise

## 2013-08-26 NOTE — Telephone Encounter (Signed)
Pt notified of Dr Moore's recommendation Verbalizes understanding 

## 2013-09-15 ENCOUNTER — Telehealth: Payer: Self-pay | Admitting: Family Medicine

## 2013-09-16 NOTE — Telephone Encounter (Signed)
Patient aware that no samples available at this time. He will check back next week.

## 2013-09-23 ENCOUNTER — Telehealth: Payer: Self-pay | Admitting: Family Medicine

## 2013-09-23 NOTE — Telephone Encounter (Signed)
labwork not due at this time. Patient notified.

## 2013-10-08 ENCOUNTER — Other Ambulatory Visit: Payer: Self-pay | Admitting: Family Medicine

## 2013-10-11 ENCOUNTER — Other Ambulatory Visit: Payer: Self-pay | Admitting: *Deleted

## 2013-10-11 MED ORDER — BENAZEPRIL HCL 20 MG PO TABS: ORAL_TABLET | ORAL | Status: DC

## 2013-10-12 ENCOUNTER — Other Ambulatory Visit: Payer: Self-pay | Admitting: Family Medicine

## 2013-11-01 ENCOUNTER — Ambulatory Visit (INDEPENDENT_AMBULATORY_CARE_PROVIDER_SITE_OTHER): Payer: Medicare Other

## 2013-11-01 DIAGNOSIS — Z23 Encounter for immunization: Secondary | ICD-10-CM

## 2014-01-08 ENCOUNTER — Other Ambulatory Visit: Payer: Self-pay | Admitting: Family Medicine

## 2014-01-18 ENCOUNTER — Other Ambulatory Visit (INDEPENDENT_AMBULATORY_CARE_PROVIDER_SITE_OTHER): Payer: Medicare HMO

## 2014-01-18 DIAGNOSIS — R710 Precipitous drop in hematocrit: Secondary | ICD-10-CM

## 2014-01-18 DIAGNOSIS — E559 Vitamin D deficiency, unspecified: Secondary | ICD-10-CM

## 2014-01-18 DIAGNOSIS — I10 Essential (primary) hypertension: Secondary | ICD-10-CM

## 2014-01-18 DIAGNOSIS — R5383 Other fatigue: Secondary | ICD-10-CM

## 2014-01-18 DIAGNOSIS — E785 Hyperlipidemia, unspecified: Secondary | ICD-10-CM

## 2014-01-18 LAB — POCT CBC
Granulocyte percent: 64.5 %G (ref 37–80)
HEMATOCRIT: 42.6 % — AB (ref 43.5–53.7)
HEMOGLOBIN: 13.1 g/dL — AB (ref 14.1–18.1)
Lymph, poc: 2.4 (ref 0.6–3.4)
MCH, POC: 28.6 pg (ref 27–31.2)
MCHC: 30.8 g/dL — AB (ref 31.8–35.4)
MCV: 92.7 fL (ref 80–97)
MPV: 8.4 fL (ref 0–99.8)
POC Granulocyte: 4.8 (ref 2–6.9)
POC LYMPH PERCENT: 31.4 %L (ref 10–50)
Platelet Count, POC: 282 10*3/uL (ref 142–424)
RBC: 4.6 M/uL — AB (ref 4.69–6.13)
RDW, POC: 12.5 %
WBC: 7.5 10*3/uL (ref 4.6–10.2)

## 2014-01-18 NOTE — Progress Notes (Signed)
Lab only 

## 2014-01-19 ENCOUNTER — Ambulatory Visit: Payer: Medicare Other | Admitting: Family Medicine

## 2014-01-19 LAB — NMR, LIPOPROFILE
Cholesterol: 126 mg/dL (ref 100–199)
HDL Cholesterol by NMR: 56 mg/dL (ref >39)
HDL Particle Number: 39.3 umol/L (ref >=30.5)
LDL Particle Number: 788 nmol/L (ref <1000)
LDL SIZE: 20.5 nm (ref >20.5)
LDL-C: 52 mg/dL (ref 0–99)
LP-IR Score: 71 — ABNORMAL HIGH (ref <=45)
Small LDL Particle Number: 454 nmol/L (ref <=527)
Triglycerides by NMR: 90 mg/dL (ref 0–149)

## 2014-01-19 LAB — BMP8+EGFR
BUN / CREAT RATIO: 14 (ref 10–22)
BUN: 20 mg/dL (ref 8–27)
CO2: 26 mmol/L (ref 18–29)
Calcium: 9.9 mg/dL (ref 8.6–10.2)
Chloride: 103 mmol/L (ref 97–108)
Creatinine, Ser: 1.42 mg/dL — ABNORMAL HIGH (ref 0.76–1.27)
GFR calc Af Amer: 52 mL/min/{1.73_m2} — ABNORMAL LOW (ref >59)
GFR, EST NON AFRICAN AMERICAN: 45 mL/min/{1.73_m2} — AB (ref >59)
Glucose: 103 mg/dL — ABNORMAL HIGH (ref 65–99)
Potassium: 3.9 mmol/L (ref 3.5–5.2)
SODIUM: 145 mmol/L — AB (ref 134–144)

## 2014-01-19 LAB — HEPATIC FUNCTION PANEL
ALT: 11 IU/L (ref 0–44)
AST: 17 IU/L (ref 0–40)
Albumin: 4.5 g/dL (ref 3.5–4.7)
Alkaline Phosphatase: 46 IU/L (ref 39–117)
BILIRUBIN DIRECT: 0.19 mg/dL (ref 0.00–0.40)
BILIRUBIN TOTAL: 0.7 mg/dL (ref 0.0–1.2)
Total Protein: 6.6 g/dL (ref 6.0–8.5)

## 2014-01-19 LAB — VITAMIN D 25 HYDROXY (VIT D DEFICIENCY, FRACTURES): VIT D 25 HYDROXY: 41.4 ng/mL (ref 30.0–100.0)

## 2014-01-29 ENCOUNTER — Other Ambulatory Visit: Payer: Self-pay | Admitting: Family Medicine

## 2014-02-04 ENCOUNTER — Other Ambulatory Visit: Payer: Self-pay | Admitting: Family Medicine

## 2014-02-06 ENCOUNTER — Other Ambulatory Visit: Payer: Self-pay | Admitting: Family Medicine

## 2014-02-07 ENCOUNTER — Encounter: Payer: Self-pay | Admitting: Family Medicine

## 2014-02-07 ENCOUNTER — Ambulatory Visit (INDEPENDENT_AMBULATORY_CARE_PROVIDER_SITE_OTHER): Payer: Medicare HMO

## 2014-02-07 ENCOUNTER — Ambulatory Visit (INDEPENDENT_AMBULATORY_CARE_PROVIDER_SITE_OTHER): Payer: Medicare HMO | Admitting: Family Medicine

## 2014-02-07 VITALS — BP 110/76 | HR 67 | Temp 96.9°F | Ht 70.5 in | Wt 153.0 lb

## 2014-02-07 DIAGNOSIS — R7989 Other specified abnormal findings of blood chemistry: Secondary | ICD-10-CM

## 2014-02-07 DIAGNOSIS — I10 Essential (primary) hypertension: Secondary | ICD-10-CM

## 2014-02-07 DIAGNOSIS — I700 Atherosclerosis of aorta: Secondary | ICD-10-CM

## 2014-02-07 DIAGNOSIS — R748 Abnormal levels of other serum enzymes: Secondary | ICD-10-CM

## 2014-02-07 DIAGNOSIS — E785 Hyperlipidemia, unspecified: Secondary | ICD-10-CM

## 2014-02-07 DIAGNOSIS — E559 Vitamin D deficiency, unspecified: Secondary | ICD-10-CM

## 2014-02-07 DIAGNOSIS — Z8546 Personal history of malignant neoplasm of prostate: Secondary | ICD-10-CM

## 2014-02-07 NOTE — Patient Instructions (Addendum)
Medicare Annual Wellness Visit  [facility] and the medical providers at [facility] strive to bring you the best medical care.  In doing so we not only want to address your current medical conditions and concerns but also to detect new conditions early and prevent illness, disease and health-related problems.    Medicare offers a yearly Wellness Visit which allows our clinical staff to assess your need for preventative services including immunizations, lifestyle education, counseling to decrease risk of preventable diseases and screening for fall risk and other medical concerns.    This visit is provided free of charge (no copay) for all Medicare recipients. The clinical pharmacists at [facility] have begun to conduct these Wellness Visits which will also include a thorough review of all your medications.    As you primary medical provider recommend that you make an appointment for your Annual Wellness Visit if you have not done so already this year.  You may set up this appointment before you leave today or you may call back ([phone]) and schedule an appointment.  Please make sure when you call that you mention that you are scheduling your Annual Wellness Visit with the clinical pharmacist so that the appointment may be made for the proper length of time.     Continue current medications. Continue good therapeutic lifestyle changes which include good diet and exercise. Fall precautions discussed with patient. If an FOBT was given today- please return it to our front desk. If you are over 74 years old - you may need Prevnar 18 or the adult Pneumonia vaccine.  Flu Shots will be available at our office starting mid- September. Please call and schedule a FLU CLINIC APPOINTMENT.   The blood pressure today on the patient was good and he can continue to take his medicines just like is been doing. We will call him with the results of  the chest x-ray Avoid NSAIDs as much as possible. This includes ibuprofen and Aleve

## 2014-02-07 NOTE — Progress Notes (Signed)
Subjective:    [name], male    DOB: February 22, 1928, 81 y.o.   MRN: [id number]  HPI Pt here for follow up and management of chronic medical problems which include hyperlipidemia and hypertension. He is taking medications regularly. The patient is doing well and has no specific complaints. He brings his blood pressures in for review and they are anywhere from 109-133/70-80. His pulses are running in anywhere between 70 and 80. He is taking only of fourth of her Crestor each day. The patient's recent lab work is reviewed with him. The blood sugar is a little bit elevated at 103 and the patient was encouraged to continue to work with his diet and exercise. The creatinine the most important kidney function test was elevated at 1.4-11 year ago it was 1.5. This elevation is consistent with past readings. He was encouraged to avoid all NSAIDs and watching his sodium intake more closely to keep his blood pressure under better control. All of his cholesterol numbers were excellent and at goal and he should continue with his current treatment liver function tests were normal vitamin D level was good and he should continue current treatment with his vitamin D the CBC had a normal white blood cell count and the hemoglobin was stable at 13.1 and the platelet count was adequate. All of this was reviewed with him and he understood the results.         Patient Active Problem List   Diagnosis Date Noted  . History of prostate cancer 09/06/2012  . HTN (hypertension) 01/20/2012  . Hyperlipidemia 01/20/2012  . Cardiovascular risk factor 01/20/2012  . PVC's (premature ventricular contractions) 01/20/2012   Outpatient Encounter Prescriptions as of 02/07/2014  Medication Sig  . amLODipine (NORVASC) 5 MG tablet TAKE 1 TABLET BY MOUTH EVERY DAY  . aspirin 81 MG tablet Take 81 mg by mouth. Monday, Wednesday, Friday  . benazepril (LOTENSIN) 20 MG tablet TAKE 1 TABLET EVERY DAY  . Cholecalciferol 1000  UNITS capsule Take 1,000 Units by mouth daily.  . fenofibrate 54 MG tablet TAKE 1 TABLET (54 MG TOTAL) BY MOUTH DAILY.  . fish oil-omega-3 fatty acids 1000 MG capsule Take 1 g by mouth daily.  . hydrochlorothiazide (HYDRODIURIL) 25 MG tablet TAKE 1 TABLET BY MOUTH EVERY DAY  . rosuvastatin (CRESTOR) 5 MG tablet Take 5 mg by mouth daily.  . vitamin C (ASCORBIC ACID) 500 MG tablet Take 500 mg by mouth daily.  . [DISCONTINUED] benazepril (LOTENSIN) 20 MG tablet TAKE 1 TABLET BY MOUTH EVERY DAY  . [DISCONTINUED] fenofibrate 54 MG tablet TAKE 1 TABLET BY MOUTH EVERY DAY  . [DISCONTINUED] lovastatin (MEVACOR) 20 MG tablet TAKE 1 TABLET BY MOUTH EVERY DAY AS DIRECTED    Review of Systems  Constitutional: Negative.   HENT: Negative.   Eyes: Negative.   Respiratory: Negative.   Cardiovascular: Negative.   Gastrointestinal: Negative.   Endocrine: Negative.   Genitourinary: Negative.   Musculoskeletal: Negative.   Skin: Negative.   Allergic/Immunologic: Negative.   Neurological: Negative.   Hematological: Negative.   Psychiatric/Behavioral: Negative.        Objective:   Physical Exam  Constitutional: He is oriented to person, place, and time. He appears well-developed and well-nourished.  Alert, younger appearing and acting than his stated age of 81.  HENT:  Head: Normocephalic and atraumatic.  Right Ear: External ear normal.  Left Ear: External ear normal.  Nose: Nose normal.  Mouth/Throat: Oropharynx is clear and moist. No  oropharyngeal exudate.  Eyes: Conjunctivae and EOM are normal. Pupils are equal, round, and reactive to light. Right eye exhibits no discharge. Left eye exhibits no discharge. No scleral icterus.  Neck: Normal range of motion. Neck supple. No tracheal deviation present. No thyromegaly present.  No carotid bruits or anterior cervical adenopathy  Cardiovascular: Normal rate, regular rhythm, normal heart sounds and intact distal pulses.  Exam reveals no gallop and no  friction rub.   No murmur heard. At 72/m with a regular rate and rhythm  Pulmonary/Chest: Effort normal and breath sounds normal. No respiratory distress. He has no wheezes. He has no rales. He exhibits no tenderness.  Clear anteriorly and posteriorly with no axillary adenopathy  Abdominal: Soft. Bowel sounds are normal. He exhibits no mass. There is no tenderness. There is no rebound and no guarding.  No epigastric tenderness or inguinal adenopathy  Musculoskeletal: Normal range of motion. He exhibits no edema or tenderness.  Lymphadenopathy:    He has no cervical adenopathy.  Neurological: He is alert and oriented to person, place, and time. He has normal reflexes. No cranial nerve deficit.  Skin: Skin is warm and dry. No rash noted. No erythema. No pallor.  The patient has tinea on most the nails of both feet  Psychiatric: He has a normal mood and affect. His behavior is normal. Judgment and thought content normal.  Nursing note and vitals reviewed.  BP 163/78 mmHg  Pulse 67  Temp(Src) 96.9 F (36.1 C) (Oral)  Ht 5' 10.5" (1.791 m)  Wt 153 lb (69.4 kg)  BMI 21.64 kg/m2  Repeat blood pressure right arm sitting was 110/76  [facility] reading (PRIMARY) by  Dr. Brunilda Payor x-ray--degenerative changes in the spine and atherosclerotic thoracic aorta                                  Assessment & Plan:  1. Essential hypertension -The patient should continue taking his Lotensin and amlodipine as he is currently doing -He should continue watching sodium intake - DG Chest 2 View; Future  2. Vitamin D deficiency -The patient should continue vitamin D as he is currently doing at 1000 units daily  3. Hyperlipemia -Continue Crestor at 5 mg daily  4. Elevated serum creatinine -Continue watching sodium intake and keeping blood pressure under good control and avoiding NSAIDs  5. History of prostate cancer -Continue follow-up with urology  6. Thoracic aortic atherosclerosis   Patient  Instructions                       Medicare Annual Wellness Visit  [facility] and the medical providers at [facility] strive to bring you the best medical care.  In doing so we not only want to address your current medical conditions and concerns but also to detect new conditions early and prevent illness, disease and health-related problems.    Medicare offers a yearly Wellness Visit which allows our clinical staff to assess your need for preventative services including immunizations, lifestyle education, counseling to decrease risk of preventable diseases and screening for fall risk and other medical concerns.    This visit is provided free of charge (no copay) for all Medicare recipients. The clinical pharmacists at [facility] have begun to conduct these Wellness Visits which will also include a thorough review of all your medications.    As you primary medical provider recommend  that you make an appointment for your Annual Wellness Visit if you have not done so already this year.  You may set up this appointment before you leave today or you may call back ([phone]) and schedule an appointment.  Please make sure when you call that you mention that you are scheduling your Annual Wellness Visit with the clinical pharmacist so that the appointment may be made for the proper length of time.     Continue current medications. Continue good therapeutic lifestyle changes which include good diet and exercise. Fall precautions discussed with patient. If an FOBT was given today- please return it to our front desk. If you are over 67 years old - you may need Prevnar 72 or the adult Pneumonia vaccine.  Flu Shots will be available at our office starting mid- September. Please call and schedule a FLU CLINIC APPOINTMENT.   The blood pressure today on the patient was good and he can continue to take his medicines just like is been doing. We will call him with  the results of the chest x-ray Avoid NSAIDs as much as possible. This includes ibuprofen and Aleve   Arrie Senate MD

## 2014-02-09 ENCOUNTER — Ambulatory Visit (INDEPENDENT_AMBULATORY_CARE_PROVIDER_SITE_OTHER): Payer: Medicare HMO | Admitting: Cardiology

## 2014-02-09 ENCOUNTER — Encounter: Payer: Self-pay | Admitting: Cardiology

## 2014-02-09 VITALS — BP 132/70 | HR 74 | Ht 70.0 in | Wt 153.0 lb

## 2014-02-09 DIAGNOSIS — Z9189 Other specified personal risk factors, not elsewhere classified: Secondary | ICD-10-CM

## 2014-02-09 DIAGNOSIS — E785 Hyperlipidemia, unspecified: Secondary | ICD-10-CM

## 2014-02-09 DIAGNOSIS — I10 Essential (primary) hypertension: Secondary | ICD-10-CM

## 2014-02-09 NOTE — Patient Instructions (Signed)
Your physician wants you to follow-up in: 1 year with Dr McLean. (January 2017). You will receive a reminder letter in the mail two months in advance. If you don't receive a letter, please call our office to schedule the follow-up appointment.  

## 2014-02-09 NOTE — Progress Notes (Signed)
[name], male   DOB: 08-Oct-1928, 81 y.o.   MRN: [id number] PCP: Dr. Laurance Flatten  81 yo with history of HTN, PVCs, and hyperlipidemia presents for cardiac followup.  He has had no recent palpitations, lightheadedness, or syncope.  He is active, walking daily for exercise.  No exertional dyspnea or chest pain.  He can walk up and down the stairs to his basement without any problems.  He is on benazepril and HCTZ for HTN.  His BP has been under good control.  He takes aspirin 4 times a week because of excessive bruising when he takes it daily. He had myalgias on lovastatin and is now on Crestor, which he is tolerating without problems so far.   ECG: NSR, PACs  Labs (8/14): LDL-P 1160, LDL 60, K 4.6, creatinine 1.5 Labs (1/16): LDL-P 788, LDL 52, K 3.9, creatinine 1.4  PMH: 1. PVCs 2. HTN 3. Hyperlipidemia: Myalgias with lovastatin 4. Prostate cancer in 1994 5. CKD  SH: Married, 2 children, nonsmoker.  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  Lives in [place].   FH: No CAD.  Mother with Alzheimers and father with prostate cancer .  Current Outpatient Prescriptions  Medication Sig Dispense Refill  . amLODipine (NORVASC) 5 MG tablet TAKE 1 TABLET BY MOUTH EVERY DAY 30 tablet 5  . aspirin 81 MG tablet Take 81 mg by mouth. Monday, Wednesday, Friday    . benazepril (LOTENSIN) 20 MG tablet TAKE 1 TABLET EVERY DAY 30 tablet 5  . Cholecalciferol 1000 UNITS capsule Take 1,000 Units by mouth daily.    . fenofibrate 54 MG tablet TAKE 1 TABLET (54 MG TOTAL) BY MOUTH DAILY. 30 tablet 2  . fish oil-omega-3 fatty acids 1000 MG capsule Take 1 g by mouth daily.    . hydrochlorothiazide (HYDRODIURIL) 25 MG tablet TAKE 1 TABLET BY MOUTH EVERY DAY 30 tablet 5  . vitamin C (ASCORBIC ACID) 500 MG tablet Take 500 mg by mouth daily.    . rosuvastatin (CRESTOR) 5 MG tablet Take 1 tablet (5 mg total) by mouth daily.     No current facility-administered medications for this visit.    BP 132/70 mmHg  Pulse 74  Ht 5\' 10"  (1.778 m)  Wt 153 lb (69.4 kg)  BMI 21.95 kg/m2 General: NAD Neck: No JVD, no thyromegaly or thyroid nodule.  Lungs: Clear to auscultation bilaterally with normal respiratory effort. CV: Nondisplaced PMI.  Heart regular S1/S2, no S3/S4, no murmur.  No peripheral edema.  No carotid bruit.  Normal pedal pulses.  Abdomen: Soft, nontender, no hepatosplenomegaly, no distention.  Skin: Intact without lesions or rashes.  Neurologic: Alert and oriented x 3.  Psych: Normal affect. Extremities: No clubbing or cyanosis.   Assessment/Plan: 1. HTN: BP is under good control.  Continue current regimen.  2. Hyperlipidemia: Tolerating Crestor without myalgias, excellent lipids in 1/16.  3. PVCs:  No palpitations.  ECG today actually showed PACs.  4. Cardiovascular risk evaluation: No ischemic symptoms.  He seems to be doing well overall.  He will continue ASA 81 mg 4 x/week.  He can followup in 1 year.  5. CKD: Stable creatinine.   [name] 02/09/2014

## 2014-04-19 ENCOUNTER — Telehealth: Payer: Self-pay | Admitting: Family Medicine

## 2014-04-19 NOTE — Telephone Encounter (Signed)
We recommend crestor - but he only takes 5 mg - he may have to skip a day IF he runs out

## 2014-04-28 ENCOUNTER — Encounter: Payer: Self-pay | Admitting: *Deleted

## 2014-07-08 ENCOUNTER — Other Ambulatory Visit: Payer: Self-pay | Admitting: Family Medicine

## 2014-08-07 ENCOUNTER — Other Ambulatory Visit: Payer: Self-pay | Admitting: Family Medicine

## 2014-08-11 ENCOUNTER — Other Ambulatory Visit (INDEPENDENT_AMBULATORY_CARE_PROVIDER_SITE_OTHER): Payer: Medicare HMO

## 2014-08-11 DIAGNOSIS — R5383 Other fatigue: Secondary | ICD-10-CM | POA: Diagnosis not present

## 2014-08-11 DIAGNOSIS — R710 Precipitous drop in hematocrit: Secondary | ICD-10-CM

## 2014-08-11 LAB — POCT CBC
GRANULOCYTE PERCENT: 70.9 % (ref 37–80)
HCT, POC: 38.8 % — AB (ref 43.5–53.7)
Hemoglobin: 12.5 g/dL — AB (ref 14.1–18.1)
Lymph, poc: 1.8 (ref 0.6–3.4)
MCH, POC: 29.8 pg (ref 27–31.2)
MCHC: 32.4 g/dL (ref 31.8–35.4)
MCV: 92.1 fL (ref 80–97)
MPV: 8.8 fL (ref 0–99.8)
PLATELET COUNT, POC: 268 10*3/uL (ref 142–424)
POC Granulocyte: 5 (ref 2–6.9)
POC LYMPH %: 25.9 % (ref 10–50)
RBC: 4.21 M/uL — AB (ref 4.69–6.13)
RDW, POC: 12.1 %
WBC: 7 10*3/uL (ref 4.6–10.2)

## 2014-08-12 LAB — HEPATIC FUNCTION PANEL
ALK PHOS: 44 IU/L (ref 39–117)
ALT: 11 IU/L (ref 0–44)
AST: 17 IU/L (ref 0–40)
Albumin: 4.4 g/dL (ref 3.5–4.7)
Bilirubin Total: 0.6 mg/dL (ref 0.0–1.2)
Bilirubin, Direct: 0.15 mg/dL (ref 0.00–0.40)
Total Protein: 6.4 g/dL (ref 6.0–8.5)

## 2014-08-12 LAB — BMP8+EGFR
BUN/Creatinine Ratio: 16 (ref 10–22)
BUN: 23 mg/dL (ref 8–27)
CO2: 23 mmol/L (ref 18–29)
Calcium: 9.9 mg/dL (ref 8.6–10.2)
Chloride: 102 mmol/L (ref 97–108)
Creatinine, Ser: 1.42 mg/dL — ABNORMAL HIGH (ref 0.76–1.27)
GFR calc Af Amer: 52 mL/min/{1.73_m2} — ABNORMAL LOW (ref >59)
GFR calc non Af Amer: 45 mL/min/{1.73_m2} — ABNORMAL LOW (ref >59)
Glucose: 95 mg/dL (ref 65–99)
Potassium: 4.4 mmol/L (ref 3.5–5.2)
Sodium: 145 mmol/L — ABNORMAL HIGH (ref 134–144)

## 2014-08-12 LAB — NMR, LIPOPROFILE
Cholesterol: 157 mg/dL (ref 100–199)
HDL Cholesterol by NMR: 49 mg/dL (ref >39)
HDL PARTICLE NUMBER: 34.5 umol/L (ref >=30.5)
LDL PARTICLE NUMBER: 1217 nmol/L — AB (ref <1000)
LDL Size: 21.1 nm (ref >20.5)
LDL-C: 92 mg/dL (ref 0–99)
LP-IR SCORE: 45 (ref <=45)
Small LDL Particle Number: 560 nmol/L — ABNORMAL HIGH (ref <=527)
Triglycerides by NMR: 82 mg/dL (ref 0–149)

## 2014-08-12 LAB — VITAMIN D 25 HYDROXY (VIT D DEFICIENCY, FRACTURES): Vit D, 25-Hydroxy: 43 ng/mL (ref 30.0–100.0)

## 2014-08-12 NOTE — Progress Notes (Signed)
Lab only 

## 2014-08-14 ENCOUNTER — Ambulatory Visit: Payer: Medicare HMO | Admitting: Family Medicine

## 2014-08-15 ENCOUNTER — Encounter: Payer: Self-pay | Admitting: Family Medicine

## 2014-08-15 ENCOUNTER — Ambulatory Visit (INDEPENDENT_AMBULATORY_CARE_PROVIDER_SITE_OTHER): Payer: Medicare HMO | Admitting: Family Medicine

## 2014-08-15 VITALS — BP 136/75 | HR 67 | Temp 97.0°F | Ht 70.0 in | Wt 150.0 lb

## 2014-08-15 DIAGNOSIS — I700 Atherosclerosis of aorta: Secondary | ICD-10-CM

## 2014-08-15 DIAGNOSIS — J301 Allergic rhinitis due to pollen: Secondary | ICD-10-CM | POA: Diagnosis not present

## 2014-08-15 DIAGNOSIS — I10 Essential (primary) hypertension: Secondary | ICD-10-CM

## 2014-08-15 DIAGNOSIS — E559 Vitamin D deficiency, unspecified: Secondary | ICD-10-CM | POA: Diagnosis not present

## 2014-08-15 DIAGNOSIS — M79642 Pain in left hand: Secondary | ICD-10-CM | POA: Diagnosis not present

## 2014-08-15 DIAGNOSIS — R7989 Other specified abnormal findings of blood chemistry: Secondary | ICD-10-CM | POA: Insufficient documentation

## 2014-08-15 DIAGNOSIS — E785 Hyperlipidemia, unspecified: Secondary | ICD-10-CM

## 2014-08-15 DIAGNOSIS — R748 Abnormal levels of other serum enzymes: Secondary | ICD-10-CM

## 2014-08-15 MED ORDER — ROSUVASTATIN CALCIUM 5 MG PO TABS: 5.0000 mg | ORAL_TABLET | Freq: Every day | ORAL | Status: DC

## 2014-08-15 MED ORDER — FLUTICASONE PROPIONATE 50 MCG/ACT NA SUSP: 1.0000 | Freq: Every day | NASAL | Status: DC

## 2014-08-15 NOTE — Progress Notes (Signed)
Subjective:    [name], male    DOB: 1928/08/20, 81 y.o.   MRN: [id number]  HPI  Pt here for follow up and management of chronic medical problems.      Patient Active Problem List   Diagnosis Date Noted  . Thoracic aortic atherosclerosis 02/07/2014  . History of prostate cancer 09/06/2012  . HTN (hypertension) 01/20/2012  . Hyperlipidemia 01/20/2012  . Cardiovascular risk factor 01/20/2012  . PVC's (premature ventricular contractions) 01/20/2012   Outpatient Encounter Prescriptions as of 08/15/2014  Medication Sig  . amLODipine (NORVASC) 5 MG tablet TAKE 1 TABLET BY MOUTH EVERY DAY  . aspirin 81 MG tablet Take 81 mg by mouth. Monday, Wednesday, Friday  . benazepril (LOTENSIN) 20 MG tablet TAKE 1 TABLET EVERY DAY  . Cholecalciferol 1000 UNITS capsule Take 1,000 Units by mouth daily.  . fenofibrate 54 MG tablet TAKE 1 TABLET (54 MG TOTAL) BY MOUTH DAILY.  . fish oil-omega-3 fatty acids 1000 MG capsule Take 1 g by mouth daily.  . hydrochlorothiazide (HYDRODIURIL) 25 MG tablet TAKE 1 TABLET BY MOUTH EVERY DAY  . rosuvastatin (CRESTOR) 5 MG tablet Take 1 tablet (5 mg total) by mouth daily.  . vitamin C (ASCORBIC ACID) 500 MG tablet Take 500 mg by mouth daily.  . [DISCONTINUED] fenofibrate 54 MG tablet TAKE 1 TABLET BY MOUTH EVERY DAY   No facility-administered encounter medications on file as of 08/15/2014.      Review of Systems  Constitutional: Negative.   HENT: Negative.   Eyes: Negative.   Respiratory: Negative.   Cardiovascular: Negative.   Gastrointestinal: Negative.   Endocrine: Negative.   Genitourinary: Negative.   Musculoskeletal: Arthralgias: bilateral index finger swelling and discomfort.  Skin: Negative.   Allergic/Immunologic: Negative.   Neurological: Negative.   Hematological: Negative.   Psychiatric/Behavioral: Negative.        Objective:   Physical Exam  Constitutional: He is oriented to person, place, and time. He appears  well-developed and well-nourished. No distress.  The patient is alert and cooperative and feeling well overall.  HENT:  Head: Normocephalic and atraumatic.  Mouth/Throat: Oropharynx is clear and moist. No oropharyngeal exudate.  There is nasal congestion bilaterally and bilateral ear cerumen  Eyes: Conjunctivae and EOM are normal. Pupils are equal, round, and reactive to light. Right eye exhibits no discharge. Left eye exhibits no discharge. No scleral icterus.  Neck: Normal range of motion. Neck supple. No thyromegaly present.  No carotid bruits  Cardiovascular: Normal rate, regular rhythm, normal heart sounds and intact distal pulses.   No murmur heard. At 72/m with no irregularity today  Pulmonary/Chest: Effort normal and breath sounds normal. No respiratory distress. He has no wheezes. He has no rales. He exhibits no tenderness.  Clear anteriorly and posteriorly and no axillary adenopathy  Abdominal: Soft. Bowel sounds are normal. He exhibits no mass. There is no tenderness. There is no rebound and no guarding.  No epigastric tenderness or organ enlargement or masses  Genitourinary: Rectum normal.  The prostate vault is empty and the rectum is clear of any masses  Musculoskeletal: He exhibits no edema or tenderness.  The patient has limited range of motion especially of the index fingers of both hands with arthritic stiffness.  Lymphadenopathy:    He has no cervical adenopathy.  Neurological: He is alert and oriented to person, place, and time. He has normal reflexes. No cranial nerve deficit.  Skin: Skin is warm and dry. No rash noted. No  erythema. No pallor.  Psychiatric: He has a normal mood and affect. His behavior is normal. Judgment and thought content normal.  Nursing note and vitals reviewed.   BP 136/75 mmHg  Pulse 67  Temp(Src) 97 F (36.1 C)  Ht 5\' 10"  (1.778 m)  Wt 150 lb (68.04 kg)  BMI 21.52 kg/m2       Assessment & Plan:  1. Essential hypertension -The  blood pressure is good today the patient should continue with current treatment  2. Vitamin D deficiency -The vitamin D level is good and the agent should continue with current treatment  3. Hyperlipemia -The patient has not been taking his medicine regularly and this is why his cholesterol numbers are elevated. He is going to try the Crestor again as he cannot tolerate the lovastatin.  4. Thoracic aortic atherosclerosis -No particular issues with this situation other than he needs to stay on his cholesterol medication  5. Hypertension, essential -Blood pressures are good and he will continue with current treatment - rosuvastatin (CRESTOR) 5 MG tablet; Take 1 tablet (5 mg total) by mouth daily. As directed  Dispense: 30 tablet; Refill: 3  6. Allergic rhinitis due to pollen -He will start Flonase 1 spray each nostril at bedtime and he has used this in the past and it helped  7. Pain of left hand -The patient has an elevated creatinine and therefore cannot take anti-inflammatory medications. He will try Tylenol for this as needed.  8. Chronic kidney disease -Keep blood pressure control and avoid NSAIDs  Meds ordered this encounter  Medications  . rosuvastatin (CRESTOR) 5 MG tablet    Sig: Take 1 tablet (5 mg total) by mouth daily. As directed    Dispense:  30 tablet    Refill:  3  . fluticasone (FLONASE) 50 MCG/ACT nasal spray    Sig: Place 1 spray into both nostrils daily.    Dispense:  16 g    Refill:  3   Patient Instructions                       Medicare Annual Wellness Visit  [facility] and the medical providers at [facility] strive to bring you the best medical care.  In doing so we not only want to address your current medical conditions and concerns but also to detect new conditions early and prevent illness, disease and health-related problems.    Medicare offers a yearly Wellness Visit which allows our clinical staff to assess your need for  preventative services including immunizations, lifestyle education, counseling to decrease risk of preventable diseases and screening for fall risk and other medical concerns.    This visit is provided free of charge (no copay) for all Medicare recipients. The clinical pharmacists at [facility] have begun to conduct these Wellness Visits which will also include a thorough review of all your medications.    As you primary medical provider recommend that you make an appointment for your Annual Wellness Visit if you have not done so already this year.  You may set up this appointment before you leave today or you may call back ([phone]) and schedule an appointment.  Please make sure when you call that you mention that you are scheduling your Annual Wellness Visit with the clinical pharmacist so that the appointment may be made for the proper length of time.     Continue current medications. Continue good therapeutic lifestyle changes which include good  diet and exercise. Fall precautions discussed with patient. If an FOBT was given today- please return it to our front desk. If you are over 61 years old - you may need Prevnar 31 or the adult Pneumonia vaccine.   After your visit with [place] today you will receive a survey in the mail or online from [employer] regarding your care with [place]. Please take a moment to fill this out. Your feedback is very important to [place] as you can help [place] better understand your patient needs as well as improve your experience and satisfaction. WE CARE ABOUT YOU!!!   The patient should take regular strength Tylenol as needed for hand pain   Arrie Senate MD

## 2014-08-15 NOTE — Patient Instructions (Addendum)
Medicare Annual Wellness Visit  [facility] and the medical providers at [facility] strive to bring you the best medical care.  In doing so we not only want to address your current medical conditions and concerns but also to detect new conditions early and prevent illness, disease and health-related problems.    Medicare offers a yearly Wellness Visit which allows our clinical staff to assess your need for preventative services including immunizations, lifestyle education, counseling to decrease risk of preventable diseases and screening for fall risk and other medical concerns.    This visit is provided free of charge (no copay) for all Medicare recipients. The clinical pharmacists at [facility] have begun to conduct these Wellness Visits which will also include a thorough review of all your medications.    As you primary medical provider recommend that you make an appointment for your Annual Wellness Visit if you have not done so already this year.  You may set up this appointment before you leave today or you may call back ([phone]) and schedule an appointment.  Please make sure when you call that you mention that you are scheduling your Annual Wellness Visit with the clinical pharmacist so that the appointment may be made for the proper length of time.     Continue current medications. Continue good therapeutic lifestyle changes which include good diet and exercise. Fall precautions discussed with patient. If an FOBT was given today- please return it to our front desk. If you are over 6 years old - you may need Prevnar 50 or the adult Pneumonia vaccine.   After your visit with [place] today you will receive a survey in the mail or online from [employer] regarding your care with [place]. Please take a moment to fill this out. Your feedback is very important to [place] as you can help [place] better understand your patient needs as well as  improve your experience and satisfaction. WE CARE ABOUT YOU!!!   The patient should take regular strength Tylenol as needed for hand pain

## 2014-08-16 ENCOUNTER — Other Ambulatory Visit: Payer: Medicare HMO

## 2014-08-16 DIAGNOSIS — Z1212 Encounter for screening for malignant neoplasm of rectum: Secondary | ICD-10-CM

## 2014-08-16 NOTE — Progress Notes (Signed)
Lab only 

## 2014-08-18 LAB — FECAL OCCULT BLOOD, IMMUNOCHEMICAL: Fecal Occult Bld: NEGATIVE

## 2014-09-01 NOTE — Progress Notes (Signed)
Pt aware of hemoccult results

## 2014-09-05 ENCOUNTER — Other Ambulatory Visit: Payer: Self-pay | Admitting: Family Medicine

## 2014-09-20 ENCOUNTER — Other Ambulatory Visit: Payer: Self-pay | Admitting: *Deleted

## 2014-09-20 DIAGNOSIS — I10 Essential (primary) hypertension: Secondary | ICD-10-CM

## 2014-09-20 MED ORDER — CRESTOR 5 MG PO TABS: 5.0000 mg | ORAL_TABLET | Freq: Every day | ORAL | Status: DC

## 2014-09-20 NOTE — Telephone Encounter (Signed)
Pt came by today and states that since he started the [facility]- he has felt bad, BP went up, myalgias , etc...  He want [place] to try and approve the [facility] name - we may get a prior approval from [facility].  -jhb

## 2014-09-25 ENCOUNTER — Telehealth: Payer: Self-pay | Admitting: *Deleted

## 2014-09-25 NOTE — Telephone Encounter (Signed)
Err note

## 2014-09-27 ENCOUNTER — Ambulatory Visit (INDEPENDENT_AMBULATORY_CARE_PROVIDER_SITE_OTHER): Payer: Medicare HMO | Admitting: Family Medicine

## 2014-09-27 ENCOUNTER — Encounter: Payer: Self-pay | Admitting: Family Medicine

## 2014-09-27 VITALS — BP 126/64 | HR 73 | Temp 97.8°F | Ht 70.5 in | Wt 150.0 lb

## 2014-09-27 DIAGNOSIS — Z0000 Encounter for general adult medical examination without abnormal findings: Secondary | ICD-10-CM

## 2014-09-27 NOTE — Progress Notes (Signed)
Subjective:    Benjamin Buchanan is a 81 y.o. male who presents for Medicare Annual/Subsequent preventive examination.   Preventive Screening-Counseling & Management  Tobacco History  Smoking status  . Never Smoker   Smokeless tobacco  . Not on file    Problems Prior to Visit 1. See below  Current Problems (verified) Patient Active Problem List   Diagnosis Date Noted  . Elevated serum creatinine 08/15/2014  . Thoracic aortic atherosclerosis 02/07/2014  . History of prostate cancer 09/06/2012  . HTN (hypertension) 01/20/2012  . Hyperlipidemia 01/20/2012  . Cardiovascular risk factor 01/20/2012  . PVC's (premature ventricular contractions) 01/20/2012    Medications Prior to Visit Current Outpatient Prescriptions on File Prior to Visit  Medication Sig Dispense Refill  . amLODipine (NORVASC) 5 MG tablet TAKE 1 TABLET BY MOUTH EVERY DAY 30 tablet 5  . aspirin 81 MG tablet Take 81 mg by mouth. Monday, Wednesday, Friday    . benazepril (LOTENSIN) 20 MG tablet TAKE 1 TABLET EVERY DAY 30 tablet 5  . Cholecalciferol 1000 UNITS capsule Take 1,000 Units by mouth daily.    . fenofibrate 54 MG tablet TAKE 1 TABLET (54 MG TOTAL) BY MOUTH DAILY. 30 tablet 2  . fish oil-omega-3 fatty acids 1000 MG capsule Take 1 g by mouth daily.    . hydrochlorothiazide (HYDRODIURIL) 25 MG tablet TAKE 1 TABLET BY MOUTH EVERY DAY 30 tablet 4  . rosuvastatin (CRESTOR) 5 MG tablet Take 1 tablet (5 mg total) by mouth daily. As directed 30 tablet 3  . vitamin C (ASCORBIC ACID) 500 MG tablet Take 500 mg by mouth daily.    . fluticasone (FLONASE) 50 MCG/ACT nasal spray Place 1 spray into both nostrils daily. (Patient not taking: Reported on 09/27/2014) 16 g 3   No current facility-administered medications on file prior to visit.    Current Medications (verified) Current Outpatient Prescriptions  Medication Sig Dispense Refill  . amLODipine (NORVASC) 5 MG tablet TAKE 1 TABLET BY MOUTH EVERY DAY 30 tablet 5   . aspirin 81 MG tablet Take 81 mg by mouth. Monday, Wednesday, Friday    . benazepril (LOTENSIN) 20 MG tablet TAKE 1 TABLET EVERY DAY 30 tablet 5  . Cholecalciferol 1000 UNITS capsule Take 1,000 Units by mouth daily.    . fenofibrate 54 MG tablet TAKE 1 TABLET (54 MG TOTAL) BY MOUTH DAILY. 30 tablet 2  . fish oil-omega-3 fatty acids 1000 MG capsule Take 1 g by mouth daily.    . hydrochlorothiazide (HYDRODIURIL) 25 MG tablet TAKE 1 TABLET BY MOUTH EVERY DAY 30 tablet 4  . rosuvastatin (CRESTOR) 5 MG tablet Take 1 tablet (5 mg total) by mouth daily. As directed 30 tablet 3  . vitamin C (ASCORBIC ACID) 500 MG tablet Take 500 mg by mouth daily.    . fluticasone (FLONASE) 50 MCG/ACT nasal spray Place 1 spray into both nostrils daily. (Patient not taking: Reported on 09/27/2014) 16 g 3   No current facility-administered medications for this visit.     Allergies (verified) Antara and Lipitor   PAST HISTORY  Family History Family History  Problem Relation Age of Onset  . Hypertension      family history  . Cancer Father     prostate cancer    Social History Social History  Substance Use Topics  . Smoking status: Never Smoker   . Smokeless tobacco: Not on file  . Alcohol Use: No    Are there smokers in your home (other than  you)?  No  Risk Factors Current exercise habits: Home exercise routine includes walking at home 1/2 mile daily.  Dietary issues discussed: no   Cardiac risk factors: advanced age (older than 15 for men, 63 for women), dyslipidemia and hypertension.  Depression Screen (Note: if answer to either of the following is "Yes", a more complete depression screening is indicated)   Q1: Over the past two weeks, have you felt down, depressed or hopeless? No  Q2: Over the past two weeks, have you felt little interest or pleasure in doing things? No  Have you lost interest or pleasure in daily life? No  Do you often feel hopeless? No  Do you cry easily over simple  problems? No  Activities of Daily Living In your present state of health, do you have any difficulty performing the following activities?:  Driving? No Managing money?  No Feeding yourself? No Getting from bed to chair? No Climbing a flight of stairs? No Preparing food and eating?: No Bathing or showering? No Getting dressed: No Getting to the toilet? No Using the toilet:No Moving around from place to place: No In the past year have you fallen or had a near fall?:No   Are you sexually active?  No  Do you have more than one partner?  No  Hearing Difficulties: No Do you often ask people to speak up or repeat themselves? No Do you experience ringing or noises in your ears? No Do you have difficulty understanding soft or whispered voices? No   Do you feel that you have a problem with memory? No  Do you often misplace items? No  Do you feel safe at home?  Yes  Cognitive Testing  Alert? Yes  Normal Appearance?Yes  Oriented to person? Yes  Place? Yes   Time? Yes  Recall of three objects?  Yes  Can perform simple calculations? Yes  Displays appropriate judgment?Yes  Can read the correct time from a watch face?Yes   Advanced Directives have been discussed with the patient? Yes   List the Names of Other Physician/Practitioners you currently use: 1.    Indicate any recent Medical Services you may have received from other than [facility] providers in the past year (date may be approximate).  Immunization History  Administered Date(s) Administered  . Influenza,inj,Quad PF,36+ Mos 12/14/2012, 11/01/2013  . Pneumococcal Conjugate-13 07/19/2013    Screening Tests Health Maintenance  Topic Date Due  . INFLUENZA VACCINE  10/15/2014 (Originally 08/14/2014)  . PNA vac Low Risk Adult (2 of 2 - PPSV23) 10/15/2014 (Originally 07/20/2014)  . COLONOSCOPY  11/14/2015  . TETANUS/TDAP  10/14/2020  . ZOSTAVAX  Completed    All answers were reviewed with the patient and necessary referrals were  made:  Kenn File, MD   09/27/2014   History reviewed: allergies, current medications, past family history, past medical history, past social history, past surgical history and problem list  Review of Systems Pertinent items are noted in HPI.    Objective:      Blood pressure 126/64, pulse 73, temperature 97.8 F (36.6 C), temperature source Oral, height 5' 10.5" (1.791 m), weight 150 lb (68.04 kg). Body mass index is 21.21 kg/(m^2).  Gen: NAD, alert, cooperative with exam HEENT: NCAT, Tms obscured by cerumen, oropharynx clear CV: RRR, good S1/S2, no murmur Resp: CTABL, no wheezes, non-labored Abd: SNTND, BS present, no guarding or organomegaly Ext: No edema, warm Neuro: Alert and oriented, No gross deficits      Assessment:  81 year old male here or annual wellness visit. He has hypertension, hyperlipidemia,PVCs, and a history of prostate cancer.  Today he has his tympanic membranes obscured by cerumen, we have flushed them out with irrigation.       Plan:     During the course of the visit the patient was educated and counseled about appropriate screening and preventive services including:    Pneumococcal vaccine   Influenza vaccine  pnemovax after Oct and flu when available  Diet review for nutrition referral? No   Patient Instructions (the written plan) was given to the patient.  Medicare Attestation I have personally reviewed: The patient's medical and social history Their use of alcohol, tobacco or illicit drugs Their current medications and supplements The patient's functional ability including ADLs,fall risks, home safety risks, cognitive, and hearing and visual impairment Diet and physical activities Evidence for depression or mood disorders  The patient's weight, height, BMI, and visual acuity have been recorded in the chart.  I have made referrals, counseling, and provided education to the patient based on review of the above and I have  provided the patient with a written personalized care plan for preventive services.     Kenn File, MD   09/27/2014

## 2014-10-06 ENCOUNTER — Other Ambulatory Visit: Payer: Self-pay | Admitting: Family Medicine

## 2014-11-16 DIAGNOSIS — H25813 Combined forms of age-related cataract, bilateral: Secondary | ICD-10-CM | POA: Diagnosis not present

## 2014-11-16 DIAGNOSIS — H353132 Nonexudative age-related macular degeneration, bilateral, intermediate dry stage: Secondary | ICD-10-CM | POA: Diagnosis not present

## 2014-12-05 ENCOUNTER — Telehealth: Payer: Self-pay | Admitting: Family Medicine

## 2014-12-06 ENCOUNTER — Ambulatory Visit (INDEPENDENT_AMBULATORY_CARE_PROVIDER_SITE_OTHER): Payer: Medicare HMO

## 2014-12-06 ENCOUNTER — Other Ambulatory Visit: Payer: Medicare HMO

## 2014-12-06 DIAGNOSIS — E785 Hyperlipidemia, unspecified: Secondary | ICD-10-CM

## 2014-12-06 DIAGNOSIS — E559 Vitamin D deficiency, unspecified: Secondary | ICD-10-CM | POA: Diagnosis not present

## 2014-12-06 DIAGNOSIS — I700 Atherosclerosis of aorta: Secondary | ICD-10-CM

## 2014-12-06 DIAGNOSIS — Z23 Encounter for immunization: Secondary | ICD-10-CM

## 2014-12-06 DIAGNOSIS — I10 Essential (primary) hypertension: Secondary | ICD-10-CM

## 2014-12-07 LAB — CBC WITH DIFFERENTIAL/PLATELET
BASOS ABS: 0 10*3/uL (ref 0.0–0.2)
Basos: 0 %
EOS (ABSOLUTE): 0.1 10*3/uL (ref 0.0–0.4)
Eos: 2 %
Hematocrit: 35.8 % — ABNORMAL LOW (ref 37.5–51.0)
Hemoglobin: 12.4 g/dL — ABNORMAL LOW (ref 12.6–17.7)
Immature Grans (Abs): 0 10*3/uL (ref 0.0–0.1)
Immature Granulocytes: 0 %
LYMPHS ABS: 1.8 10*3/uL (ref 0.7–3.1)
LYMPHS: 25 %
MCH: 31.5 pg (ref 26.6–33.0)
MCHC: 34.6 g/dL (ref 31.5–35.7)
MCV: 91 fL (ref 79–97)
MONOS ABS: 0.4 10*3/uL (ref 0.1–0.9)
Monocytes: 6 %
NEUTROS ABS: 4.8 10*3/uL (ref 1.4–7.0)
Neutrophils: 67 %
PLATELETS: 305 10*3/uL (ref 150–379)
RBC: 3.94 x10E6/uL — ABNORMAL LOW (ref 4.14–5.80)
RDW: 12.2 % — AB (ref 12.3–15.4)
WBC: 7.2 10*3/uL (ref 3.4–10.8)

## 2014-12-07 LAB — NMR, LIPOPROFILE
CHOLESTEROL: 118 mg/dL (ref 100–199)
HDL Cholesterol by NMR: 56 mg/dL (ref >39)
HDL PARTICLE NUMBER: 37.5 umol/L (ref >=30.5)
LDL PARTICLE NUMBER: 657 nmol/L (ref <1000)
LDL Size: 20.7 nm (ref >20.5)
LDL-C: 46 mg/dL (ref 0–99)
LP-IR SCORE: 42 (ref <=45)
SMALL LDL PARTICLE NUMBER: 217 nmol/L (ref <=527)
TRIGLYCERIDES BY NMR: 80 mg/dL (ref 0–149)

## 2014-12-07 LAB — BMP8+EGFR
BUN / CREAT RATIO: 15 (ref 10–22)
BUN: 19 mg/dL (ref 8–27)
CALCIUM: 9.9 mg/dL (ref 8.6–10.2)
CHLORIDE: 104 mmol/L (ref 97–106)
CO2: 23 mmol/L (ref 18–29)
Creatinine, Ser: 1.23 mg/dL (ref 0.76–1.27)
GFR calc non Af Amer: 54 mL/min/{1.73_m2} — ABNORMAL LOW (ref >59)
GFR, EST AFRICAN AMERICAN: 62 mL/min/{1.73_m2} (ref >59)
Glucose: 97 mg/dL (ref 65–99)
POTASSIUM: 3.6 mmol/L (ref 3.5–5.2)
SODIUM: 145 mmol/L — AB (ref 136–144)

## 2014-12-07 LAB — HEPATIC FUNCTION PANEL
ALT: 11 IU/L (ref 0–44)
AST: 19 IU/L (ref 0–40)
Albumin: 4.5 g/dL (ref 3.5–4.7)
Alkaline Phosphatase: 37 IU/L — ABNORMAL LOW (ref 39–117)
Bilirubin Total: 0.9 mg/dL (ref 0.0–1.2)
Bilirubin, Direct: 0.25 mg/dL (ref 0.00–0.40)
TOTAL PROTEIN: 6.6 g/dL (ref 6.0–8.5)

## 2014-12-07 LAB — VITAMIN D 25 HYDROXY (VIT D DEFICIENCY, FRACTURES): VIT D 25 HYDROXY: 43.3 ng/mL (ref 30.0–100.0)

## 2015-02-03 ENCOUNTER — Other Ambulatory Visit: Payer: Self-pay | Admitting: Family Medicine

## 2015-02-09 ENCOUNTER — Other Ambulatory Visit: Payer: Self-pay | Admitting: Family Medicine

## 2015-02-15 ENCOUNTER — Other Ambulatory Visit: Payer: Medicare HMO

## 2015-02-15 DIAGNOSIS — I10 Essential (primary) hypertension: Secondary | ICD-10-CM | POA: Diagnosis not present

## 2015-02-15 DIAGNOSIS — E785 Hyperlipidemia, unspecified: Secondary | ICD-10-CM

## 2015-02-15 DIAGNOSIS — E559 Vitamin D deficiency, unspecified: Secondary | ICD-10-CM

## 2015-02-15 NOTE — Progress Notes (Signed)
Lab only 

## 2015-02-16 LAB — CBC WITH DIFFERENTIAL/PLATELET
BASOS ABS: 0 10*3/uL (ref 0.0–0.2)
Basos: 0 %
EOS (ABSOLUTE): 0.3 10*3/uL (ref 0.0–0.4)
Eos: 4 %
HEMOGLOBIN: 12.1 g/dL — AB (ref 12.6–17.7)
Hematocrit: 36.9 % — ABNORMAL LOW (ref 37.5–51.0)
Immature Grans (Abs): 0 10*3/uL (ref 0.0–0.1)
Immature Granulocytes: 0 %
LYMPHS ABS: 1.7 10*3/uL (ref 0.7–3.1)
Lymphs: 25 %
MCH: 30.8 pg (ref 26.6–33.0)
MCHC: 32.8 g/dL (ref 31.5–35.7)
MCV: 94 fL (ref 79–97)
MONOCYTES: 6 %
MONOS ABS: 0.4 10*3/uL (ref 0.1–0.9)
NEUTROS ABS: 4.4 10*3/uL (ref 1.4–7.0)
Neutrophils: 65 %
Platelets: 302 10*3/uL (ref 150–379)
RBC: 3.93 x10E6/uL — ABNORMAL LOW (ref 4.14–5.80)
RDW: 13.1 % (ref 12.3–15.4)
WBC: 6.7 10*3/uL (ref 3.4–10.8)

## 2015-02-16 LAB — HEPATIC FUNCTION PANEL
ALT: 13 IU/L (ref 0–44)
AST: 21 IU/L (ref 0–40)
Albumin: 4.2 g/dL (ref 3.5–4.7)
Alkaline Phosphatase: 42 IU/L (ref 39–117)
Bilirubin Total: 0.6 mg/dL (ref 0.0–1.2)
Bilirubin, Direct: 0.2 mg/dL (ref 0.00–0.40)
TOTAL PROTEIN: 6.4 g/dL (ref 6.0–8.5)

## 2015-02-16 LAB — NMR, LIPOPROFILE
CHOLESTEROL: 115 mg/dL (ref 100–199)
HDL Cholesterol by NMR: 55 mg/dL (ref >39)
HDL Particle Number: 39.1 umol/L (ref >=30.5)
LDL PARTICLE NUMBER: 558 nmol/L (ref <1000)
LDL Size: 20.6 nm (ref >20.5)
LDL-C: 41 mg/dL (ref 0–99)
LP-IR SCORE: 50 — AB (ref <=45)
Small LDL Particle Number: 283 nmol/L (ref <=527)
Triglycerides by NMR: 93 mg/dL (ref 0–149)

## 2015-02-16 LAB — BMP8+EGFR
BUN / CREAT RATIO: 14 (ref 10–22)
BUN: 19 mg/dL (ref 8–27)
CHLORIDE: 104 mmol/L (ref 96–106)
CO2: 25 mmol/L (ref 18–29)
Calcium: 9.7 mg/dL (ref 8.6–10.2)
Creatinine, Ser: 1.33 mg/dL — ABNORMAL HIGH (ref 0.76–1.27)
GFR calc non Af Amer: 49 mL/min/{1.73_m2} — ABNORMAL LOW (ref >59)
GFR, EST AFRICAN AMERICAN: 56 mL/min/{1.73_m2} — AB (ref >59)
Glucose: 93 mg/dL (ref 65–99)
POTASSIUM: 3.7 mmol/L (ref 3.5–5.2)
SODIUM: 145 mmol/L — AB (ref 134–144)

## 2015-02-16 LAB — VITAMIN D 25 HYDROXY (VIT D DEFICIENCY, FRACTURES): VIT D 25 HYDROXY: 37.3 ng/mL (ref 30.0–100.0)

## 2015-02-21 ENCOUNTER — Ambulatory Visit (INDEPENDENT_AMBULATORY_CARE_PROVIDER_SITE_OTHER): Payer: Medicare HMO | Admitting: Family Medicine

## 2015-02-21 ENCOUNTER — Encounter: Payer: Self-pay | Admitting: Family Medicine

## 2015-02-21 VITALS — BP 120/75 | HR 76 | Temp 97.0°F | Ht 70.5 in | Wt 143.0 lb

## 2015-02-21 DIAGNOSIS — E785 Hyperlipidemia, unspecified: Secondary | ICD-10-CM

## 2015-02-21 DIAGNOSIS — I10 Essential (primary) hypertension: Secondary | ICD-10-CM | POA: Diagnosis not present

## 2015-02-21 DIAGNOSIS — I700 Atherosclerosis of aorta: Secondary | ICD-10-CM | POA: Diagnosis not present

## 2015-02-21 DIAGNOSIS — R748 Abnormal levels of other serum enzymes: Secondary | ICD-10-CM

## 2015-02-21 DIAGNOSIS — I493 Ventricular premature depolarization: Secondary | ICD-10-CM | POA: Diagnosis not present

## 2015-02-21 DIAGNOSIS — R7989 Other specified abnormal findings of blood chemistry: Secondary | ICD-10-CM

## 2015-02-21 DIAGNOSIS — E559 Vitamin D deficiency, unspecified: Secondary | ICD-10-CM | POA: Diagnosis not present

## 2015-02-21 NOTE — Patient Instructions (Addendum)
Medicare Annual Wellness Visit  [facility] and the medical providers at [facility] strive to bring you the best medical care.  In doing so we not only want to address your current medical conditions and concerns but also to detect new conditions early and prevent illness, disease and health-related problems.    Medicare offers a yearly Wellness Visit which allows our clinical staff to assess your need for preventative services including immunizations, lifestyle education, counseling to decrease risk of preventable diseases and screening for fall risk and other medical concerns.    This visit is provided free of charge (no copay) for all Medicare recipients. The clinical pharmacists at [facility] have begun to conduct these Wellness Visits which will also include a thorough review of all your medications.    As you primary medical provider recommend that you make an appointment for your Annual Wellness Visit if you have not done so already this year.  You may set up this appointment before you leave today or you may call back [phone] and schedule an appointment.  Please make sure when you call that you mention that you are scheduling your Annual Wellness Visit with the clinical pharmacist so that the appointment may be made for the proper length of time.     Continue current medications. Continue good therapeutic lifestyle changes which include good diet and exercise. Fall precautions discussed with patient. If an FOBT was given today- please return it to our front desk. If you are over 9 years old - you may need Prevnar 59 or the adult Pneumonia vaccine.  **Flu shots are available--- please call and schedule a FLU-CLINIC appointment**  After your visit with [place] today you will receive a survey in the mail or online from [employer] regarding your care with [place]. Please take a moment to fill this out. Your feedback is very  important to [place] as you can help [place] better understand your patient needs as well as improve your experience and satisfaction. WE CARE ABOUT YOU!!!   The patient should continue to use common sense. He should avoid climbing. He should always stay in if the weather is bad and do not put himself at risk for falling He should walk and exercise regularly as the weather warms This winter he should drink plenty of fluids and stay well hydrated

## 2015-02-21 NOTE — Progress Notes (Signed)
Subjective:    [name], male    DOB: 02/02/1928, 82 y.o.   MRN: [id number]  HPI Pt here for follow up and management of chronic medical problems which includes hypertension and hyperlipidemia. He is taking medications regularly. The patient is doing well and comes in today with no complaints. He gets his rectal exam done in July. He's had recent lab work and this will be reviewed with him during the visit today.Wynetta Emery all numbers with advanced lipid testing are all excellent and at goal. The total LDL particle number is excellent at 558. The LDL C is good at 41 the triglycerides are good at 93 and the good cholesterol or the HDL particle number is good. The patient should continue with his current treatment and with as aggressive therapeutic lifestyle changes as possible The blood sugar is good at 93. The creatinine, the most important kidney function test remains slightly elevated and is similar to previous readings. The GFR is 49. The electrolytes including potassium are good except the serum sodium is slightly elevated and this has been the case in the past. The CBC has a normal white blood cell count. The hemoglobin is good and stable at 12.1. The platelet count is adequate. All liver function tests are within normal limits The vitamin D level was 37.3 and since his creatinine is slightly elevated we cannot take anymore vitamin D than he is currently taking.      Patient Active Problem List   Diagnosis Date Noted  . Elevated serum creatinine 08/15/2014  . Thoracic aortic atherosclerosis ([place]) 02/07/2014  . History of prostate cancer 09/06/2012  . HTN (hypertension) 01/20/2012  . Hyperlipidemia 01/20/2012  . Cardiovascular risk factor 01/20/2012  . PVC's (premature ventricular contractions) 01/20/2012   Outpatient Encounter Prescriptions as of 02/21/2015  Medication Sig  . amLODipine (NORVASC) 5 MG tablet TAKE 1 TABLET BY MOUTH EVERY DAY  . aspirin 81 MG tablet Take  81 mg by mouth. Monday, Wednesday, Friday  . benazepril (LOTENSIN) 20 MG tablet TAKE 1 TABLET EVERY DAY  . Cholecalciferol 1000 UNITS capsule Take 1,000 Units by mouth daily.  . fenofibrate 54 MG tablet TAKE 1 TABLET (54 MG TOTAL) BY MOUTH DAILY.  . fenofibrate 54 MG tablet TAKE 1 TABLET BY MOUTH EVERY DAY  . fish oil-omega-3 fatty acids 1000 MG capsule Take 1 g by mouth daily.  . fluticasone (FLONASE) 50 MCG/ACT nasal spray Place 1 spray into both nostrils daily.  . hydrochlorothiazide (HYDRODIURIL) 25 MG tablet TAKE 1 TABLET BY MOUTH EVERY DAY  . rosuvastatin (CRESTOR) 5 MG tablet TAKE 1 TABLET (5 MG TOTAL) BY MOUTH DAILY. AS DIRECTED  . vitamin C (ASCORBIC ACID) 500 MG tablet Take 500 mg by mouth daily.   No facility-administered encounter medications on file as of 02/21/2015.     Review of Systems  Constitutional: Negative.   HENT: Negative.   Eyes: Negative.   Respiratory: Negative.   Cardiovascular: Negative.   Gastrointestinal: Negative.   Endocrine: Negative.   Genitourinary: Negative.   Musculoskeletal: Negative.   Skin: Negative.   Allergic/Immunologic: Negative.   Neurological: Negative.   Hematological: Negative.   Psychiatric/Behavioral: Negative.        Objective:   Physical Exam  Constitutional: He is oriented to person, place, and time. He appears well-developed and well-nourished. No distress.  The patient is doing remarkably well and looks much younger than his stated age of 89 years.  HENT:  Head: Normocephalic and atraumatic.  Right Ear: External ear normal.  Left Ear: External ear normal.  Nose: Nose normal.  Mouth/Throat: Oropharynx is clear and moist. No oropharyngeal exudate.  Eyes: Conjunctivae and EOM are normal. Pupils are equal, round, and reactive to light. Right eye exhibits no discharge. Left eye exhibits no discharge. No scleral icterus.  Neck: Normal range of motion. Neck supple. No thyromegaly present.  No bruits thyromegaly or anterior  cervical adenopathy  Cardiovascular: Normal rate, regular rhythm, normal heart sounds and intact distal pulses.   No murmur heard. The rhythm is regular at 72/m  Pulmonary/Chest: Effort normal and breath sounds normal. No respiratory distress. He has no wheezes. He has no rales. He exhibits no tenderness.  No axillary adenopathy Clear anteriorly and posteriorly  Abdominal: Soft. Bowel sounds are normal. He exhibits no mass. There is no tenderness. There is no rebound and no guarding.  No abdominal tenderness masses inguinal adenopathy and no bruits  Musculoskeletal: Normal range of motion. He exhibits no edema.  Lymphadenopathy:    He has no cervical adenopathy.  Neurological: He is alert and oriented to person, place, and time. He has normal reflexes. No cranial nerve deficit.  Skin: Skin is warm and dry. No rash noted.  Psychiatric: He has a normal mood and affect. His behavior is normal. Judgment and thought content normal.  Nursing note and vitals reviewed.   BP 120/75 mmHg  Pulse 76  Temp(Src) 97 F (36.1 C) (Oral)  Ht 5' 10.5" (1.791 m)  Wt 143 lb (64.864 kg)  BMI 20.22 kg/m2       Assessment & Plan:  1. Essential hypertension -The blood pressure is excellent today the patient will continue with current treatment  2. Vitamin D deficiency -The patient will continue with current treatment  3. Hyperlipemia -All cholesterol numbers were excellent and he will continue with generic Crestor 5 mg daily  4. Thoracic aortic atherosclerosis ([employer]) -Continue with exercise and aggressive diet habits  5. PVC's (premature ventricular contractions) -No PVCs were present on the exam today and a patient had a regular rate and rhythm at 72/m  6. Hyperlipidemia -Continue with diet and current treatment  7. Elevated serum creatinine -The creatinine was only slightly elevated and improved from 6 months ago. We will continue to monitor this and the patient was reminded to avoid all  NSAIDs like ibuprofen and Aleve  Patient Instructions                       Medicare Annual Wellness Visit  [facility] and the medical providers at [facility] strive to bring you the best medical care.  In doing so we not only want to address your current medical conditions and concerns but also to detect new conditions early and prevent illness, disease and health-related problems.    Medicare offers a yearly Wellness Visit which allows our clinical staff to assess your need for preventative services including immunizations, lifestyle education, counseling to decrease risk of preventable diseases and screening for fall risk and other medical concerns.    This visit is provided free of charge (no copay) for all Medicare recipients. The clinical pharmacists at [facility] have begun to conduct these Wellness Visits which will also include a thorough review of all your medications.    As you primary medical provider recommend that you make an appointment for your Annual Wellness Visit if you have not done so already this year.  You may set up  this appointment before you leave today or you may call back [phone] and schedule an appointment.  Please make sure when you call that you mention that you are scheduling your Annual Wellness Visit with the clinical pharmacist so that the appointment may be made for the proper length of time.     Continue current medications. Continue good therapeutic lifestyle changes which include good diet and exercise. Fall precautions discussed with patient. If an FOBT was given today- please return it to our front desk. If you are over 74 years old - you may need Prevnar 19 or the adult Pneumonia vaccine.  **Flu shots are available--- please call and schedule a FLU-CLINIC appointment**  After your visit with [place] today you will receive a survey in the mail or online from [employer] regarding your care with [place]. Please  take a moment to fill this out. Your feedback is very important to [place] as you can help [place] better understand your patient needs as well as improve your experience and satisfaction. WE CARE ABOUT YOU!!!   The patient should continue to use common sense. He should avoid climbing. He should always stay in if the weather is bad and do not put himself at risk for falling He should walk and exercise regularly as the weather warms This winter he should drink plenty of fluids and stay well hydrated   Arrie Senate MD

## 2015-02-26 ENCOUNTER — Other Ambulatory Visit: Payer: Self-pay | Admitting: Family Medicine

## 2015-02-26 ENCOUNTER — Telehealth: Payer: Self-pay | Admitting: Family Medicine

## 2015-02-26 MED ORDER — DONEPEZIL HCL 5 MG PO TABS: 5.0000 mg | ORAL_TABLET | Freq: Every day | ORAL | Status: DC

## 2015-02-26 NOTE — Telephone Encounter (Signed)
Start Aricept 5 mg 1 daily, wait 1 month have patient call back, if no problems with taking 5 mg daily Will increase to 10 mg daily at that time.

## 2015-02-26 NOTE — Telephone Encounter (Signed)
Patient aware and will call us when he gets close to being complete

## 2015-02-26 NOTE — Telephone Encounter (Signed)
Patient states that he thought he was something for memory he thinks aricept

## 2015-03-10 ENCOUNTER — Other Ambulatory Visit: Payer: Self-pay | Admitting: Family Medicine

## 2015-03-16 ENCOUNTER — Other Ambulatory Visit: Payer: Self-pay | Admitting: Family Medicine

## 2015-04-02 ENCOUNTER — Other Ambulatory Visit: Payer: Self-pay | Admitting: Family Medicine

## 2015-04-03 ENCOUNTER — Other Ambulatory Visit: Payer: Self-pay | Admitting: Family Medicine

## 2015-05-24 DIAGNOSIS — H353132 Nonexudative age-related macular degeneration, bilateral, intermediate dry stage: Secondary | ICD-10-CM | POA: Diagnosis not present

## 2015-05-24 DIAGNOSIS — H25813 Combined forms of age-related cataract, bilateral: Secondary | ICD-10-CM | POA: Diagnosis not present

## 2015-05-24 DIAGNOSIS — H524 Presbyopia: Secondary | ICD-10-CM | POA: Diagnosis not present

## 2015-06-04 DIAGNOSIS — H2511 Age-related nuclear cataract, right eye: Secondary | ICD-10-CM | POA: Diagnosis not present

## 2015-06-28 ENCOUNTER — Other Ambulatory Visit: Payer: Self-pay | Admitting: Family Medicine

## 2015-07-02 DIAGNOSIS — H25811 Combined forms of age-related cataract, right eye: Secondary | ICD-10-CM | POA: Diagnosis not present

## 2015-07-02 DIAGNOSIS — H2511 Age-related nuclear cataract, right eye: Secondary | ICD-10-CM | POA: Diagnosis not present

## 2015-07-10 DIAGNOSIS — H2512 Age-related nuclear cataract, left eye: Secondary | ICD-10-CM | POA: Diagnosis not present

## 2015-07-16 DIAGNOSIS — H25812 Combined forms of age-related cataract, left eye: Secondary | ICD-10-CM | POA: Diagnosis not present

## 2015-07-16 DIAGNOSIS — H2512 Age-related nuclear cataract, left eye: Secondary | ICD-10-CM | POA: Diagnosis not present

## 2015-08-22 ENCOUNTER — Ambulatory Visit (INDEPENDENT_AMBULATORY_CARE_PROVIDER_SITE_OTHER): Payer: Medicare HMO | Admitting: Family Medicine

## 2015-08-22 ENCOUNTER — Encounter: Payer: Self-pay | Admitting: Family Medicine

## 2015-08-22 VITALS — BP 115/75 | HR 91 | Temp 96.9°F | Ht 70.5 in | Wt 135.0 lb

## 2015-08-22 DIAGNOSIS — I10 Essential (primary) hypertension: Secondary | ICD-10-CM

## 2015-08-22 DIAGNOSIS — D649 Anemia, unspecified: Secondary | ICD-10-CM

## 2015-08-22 DIAGNOSIS — Z1211 Encounter for screening for malignant neoplasm of colon: Secondary | ICD-10-CM | POA: Diagnosis not present

## 2015-08-22 DIAGNOSIS — C61 Malignant neoplasm of prostate: Secondary | ICD-10-CM

## 2015-08-22 DIAGNOSIS — I700 Atherosclerosis of aorta: Secondary | ICD-10-CM

## 2015-08-22 DIAGNOSIS — Z0000 Encounter for general adult medical examination without abnormal findings: Secondary | ICD-10-CM | POA: Diagnosis not present

## 2015-08-22 DIAGNOSIS — E559 Vitamin D deficiency, unspecified: Secondary | ICD-10-CM

## 2015-08-22 DIAGNOSIS — E785 Hyperlipidemia, unspecified: Secondary | ICD-10-CM | POA: Diagnosis not present

## 2015-08-22 LAB — MICROSCOPIC EXAMINATION
Bacteria, UA: NONE SEEN
Epithelial Cells (non renal): NONE SEEN /hpf (ref 0–10)
RBC MICROSCOPIC, UA: NONE SEEN /HPF (ref 0–?)
WBC UA: NONE SEEN /HPF (ref 0–?)

## 2015-08-22 LAB — URINALYSIS, COMPLETE
BILIRUBIN UA: NEGATIVE
Glucose, UA: NEGATIVE
Leukocytes, UA: NEGATIVE
NITRITE UA: NEGATIVE
PH UA: 5.5 (ref 5.0–7.5)
Protein, UA: NEGATIVE
RBC UA: NEGATIVE
Specific Gravity, UA: 1.02 (ref 1.005–1.030)
UUROB: 0.2 mg/dL (ref 0.2–1.0)

## 2015-08-22 NOTE — Progress Notes (Signed)
Subjective:    [name], male    DOB: [birth year], 82 y.o.   MRN: [id number]  HPI Pt here for follow up and management of chronic medical problems which includes hyperlipidemia and hypertension. He is taking medications regularly.The patient is doing well today with no specific complaints. He is due to have a rectal exam and to be given an FOBT to return. He'll get lab work today. He'll also get a urinalysis. His blood pressures have been stable. The patient is in good spirits. He's been drinking plenty of fluids with the hotter weather. He says he's been following his wife's low blood sugar diet and has lost weight because of this but still feels good. His documented weight loss is about 8 pounds. Once again in the review of systems is absolutely no complaints whatsoever no concerns that he has or any physical symptoms. He does have some right shoulder pain and stiffness and this is been going on for years and he did see the orthopedist in the past but he is satisfied with his current condition with this. 27 years ago the patient did have a prostatectomy for prostate cancer. He no longer sees the urologist.    Patient Active Problem List   Diagnosis Date Noted  . Vitamin D deficiency 02/21/2015  . Elevated serum creatinine 08/15/2014  . Thoracic aortic atherosclerosis ([place]) 02/07/2014  . History of prostate cancer 09/06/2012  . HTN (hypertension) 01/20/2012  . Hyperlipidemia 01/20/2012  . Cardiovascular risk factor 01/20/2012  . PVC's (premature ventricular contractions) 01/20/2012   Outpatient Encounter Prescriptions as of 08/22/2015  Medication Sig  . amLODipine (NORVASC) 5 MG tablet TAKE 1 TABLET BY MOUTH EVERY DAY  . aspirin 81 MG tablet Take 81 mg by mouth. Monday, Wednesday, Friday  . benazepril (LOTENSIN) 20 MG tablet TAKE 1 TABLET EVERY DAY  . Cholecalciferol 1000 UNITS capsule Take 1,000 Units by mouth daily.  . fenofibrate 54 MG tablet TAKE 1 TABLET (54 MG TOTAL)  BY MOUTH DAILY.  . fish oil-omega-3 fatty acids 1000 MG capsule Take 1 g by mouth daily.  . hydrochlorothiazide (HYDRODIURIL) 25 MG tablet TAKE 1 TABLET BY MOUTH EVERY DAY  . rosuvastatin (CRESTOR) 5 MG tablet TAKE 1 TABLET (5 MG TOTAL) BY MOUTH DAILY. AS DIRECTED  . vitamin C (ASCORBIC ACID) 500 MG tablet Take 500 mg by mouth daily.  . [DISCONTINUED] amLODipine (NORVASC) 5 MG tablet TAKE 1 TABLET BY MOUTH EVERY DAY  . [DISCONTINUED] donepezil (ARICEPT) 5 MG tablet Take 1 tablet (5 mg total) by mouth at bedtime.  . [DISCONTINUED] fenofibrate 54 MG tablet TAKE 1 TABLET BY MOUTH EVERY DAY  . [DISCONTINUED] fluticasone (FLONASE) 50 MCG/ACT nasal spray Place 1 spray into both nostrils daily.  . [DISCONTINUED] hydrochlorothiazide (HYDRODIURIL) 25 MG tablet TAKE 1 TABLET BY MOUTH EVERY DAY   No facility-administered encounter medications on file as of 08/22/2015.       Review of Systems  Constitutional: Negative.   HENT: Negative.   Eyes: Negative.   Respiratory: Negative.   Cardiovascular: Negative.   Gastrointestinal: Negative.   Endocrine: Negative.   Genitourinary: Negative.   Musculoskeletal: Negative.   Skin: Negative.   Allergic/Immunologic: Negative.   Neurological: Negative.   Hematological: Negative.   Psychiatric/Behavioral: Negative.        Objective:   Physical Exam  Constitutional: He is oriented to person, place, and time. He appears well-developed and well-nourished. No distress.  Thin, alert.  HENT:  Head: Normocephalic and atraumatic.  Right Ear: External ear normal.  Left Ear: External ear normal.  Nose: Nose normal.  Mouth/Throat: Oropharynx is clear and moist. No oropharyngeal exudate.  Eyes: Conjunctivae and EOM are normal. Pupils are equal, round, and reactive to light. Right eye exhibits no discharge. Left eye exhibits no discharge. No scleral icterus.  Recent cataract surgery with implants  Neck: Normal range of motion. Neck supple. No thyromegaly  present.  No bruits thyromegaly or anterior cervical adenopathy  Cardiovascular: Normal rate, regular rhythm and normal heart sounds.   No murmur heard. Distal pulses were slightly difficult to palpate in the right lower extremity. Heart has a regular rate and rhythm at 72/m  Pulmonary/Chest: Effort normal and breath sounds normal. No respiratory distress. He has no wheezes. He has no rales. He exhibits no tenderness.  Clear anteriorly and posteriorly and no axillary adenopathy  Abdominal: Soft. Bowel sounds are normal. He exhibits no mass. There is no tenderness. There is no rebound and no guarding.  No abdominal masses or organ enlargement or bruits or inguinal adenopathy  Genitourinary: Rectum normal and penis normal.  Genitourinary Comments: The prostate vault is empty without masses or lumps. There were no rectal masses. There were no inguinal hernias palpable. The external genitalia were within normal limits.  Musculoskeletal: Normal range of motion. He exhibits no edema.  Lymphadenopathy:    He has no cervical adenopathy.  Neurological: He is alert and oriented to person, place, and time. He has normal reflexes. No cranial nerve deficit.  Skin: Skin is warm and dry. No rash noted.  Psychiatric: He has a normal mood and affect. His behavior is normal. Judgment and thought content normal.  Nursing note and vitals reviewed.  BP 115/75 (BP Location: Right Arm)   Pulse 91   Temp (!) 96.9 F (36.1 C) (Oral)   Ht 5' 10.5" (1.791 m)   Wt 135 lb (61.2 kg)   BMI 19.10 kg/m         Assessment & Plan:  1. Essential hypertension -The blood pressure is good today and he will continue with current treatment - BMP8+EGFR - CBC with Differential/Platelet - Hepatic function panel  2. Vitamin D deficiency -Continue with current treatment pending results of lab work - CBC with Differential/Platelet - VITAMIN D 25 Hydroxy (Vit-D Deficiency, Fractures)  3. Hyperlipemia -Continue with  current treatment pending results of lab work - CBC with Differential/Platelet - Lipid panel  4. Thoracic aortic atherosclerosis ([place]) -Continue with aggressive therapeutic lifestyle changes but he can modify his diet somewhat as his BMI is 20. - CBC with Differential/Platelet  5. Healthcare maintenance - CBC with Differential/Platelet - Urinalysis, Complete - Fecal occult blood, imunochemical; Future  6. Special screening for malignant neoplasms, colon - Fecal occult blood, imunochemical; Future  7. Prostate cancer ([place]) -The prostate vault remains empty with no masses detected rectally. He is having no problems with urinary control.  Patient Instructions                       Medicare Annual Wellness Visit  [facility] and the medical providers at [facility] strive to bring you the best medical care.  In doing so we not only want to address your current medical conditions and concerns but also to detect new conditions early and prevent illness, disease and health-related problems.    Medicare offers a yearly Wellness Visit which allows our clinical staff to assess your need for preventative services including immunizations, lifestyle  education, counseling to decrease risk of preventable diseases and screening for fall risk and other medical concerns.    This visit is provided free of charge (no copay) for all Medicare recipients. The clinical pharmacists at [facility] have begun to conduct these Wellness Visits which will also include a thorough review of all your medications.    As you primary medical provider recommend that you make an appointment for your Annual Wellness Visit if you have not done so already this year.  You may set up this appointment before you leave today or you may call back [phone]) and schedule an appointment.  Please make sure when you call that you mention that you are scheduling your Annual Wellness Visit with  the clinical pharmacist so that the appointment may be made for the proper length of time.     Continue current medications. Continue good therapeutic lifestyle changes which include good diet and exercise. Fall precautions discussed with patient. If an FOBT was given today- please return it to our front desk. If you are over 49 years old - you may need Prevnar 4 or the adult Pneumonia vaccine.  After your visit with [place] today you will receive a survey in the mail or online from [employer] regarding your care with [place]. Please take a moment to fill this out. Your feedback is very important to [place] as you can help [place] better understand your patient needs as well as improve your experience and satisfaction. WE CARE ABOUT YOU!!!   Your body mass index is 20 with the 8 pound weight loss that you have had by following your wife's diabetic diet. I've encouraged him to liberalize his diet some more so that he does not continue to lose weight. He understands this. This summer he will continue to drink plenty of fluids and stay well hydrated We will call with the urinalysis result as soon as it becomes available Take Tylenol as needed for right shoulder pain    Arrie Senate MD

## 2015-08-22 NOTE — Patient Instructions (Addendum)
Medicare Annual Wellness Visit  [facility] and the medical providers at [facility] strive to bring you the best medical care.  In doing so we not only want to address your current medical conditions and concerns but also to detect new conditions early and prevent illness, disease and health-related problems.    Medicare offers a yearly Wellness Visit which allows our clinical staff to assess your need for preventative services including immunizations, lifestyle education, counseling to decrease risk of preventable diseases and screening for fall risk and other medical concerns.    This visit is provided free of charge (no copay) for all Medicare recipients. The clinical pharmacists at [facility] have begun to conduct these Wellness Visits which will also include a thorough review of all your medications.    As you primary medical provider recommend that you make an appointment for your Annual Wellness Visit if you have not done so already this year.  You may set up this appointment before you leave today or you may call back [phone] and schedule an appointment.  Please make sure when you call that you mention that you are scheduling your Annual Wellness Visit with the clinical pharmacist so that the appointment may be made for the proper length of time.     Continue current medications. Continue good therapeutic lifestyle changes which include good diet and exercise. Fall precautions discussed with patient. If an FOBT was given today- please return it to our front desk. If you are over 61 years old - you may need Prevnar 26 or the adult Pneumonia vaccine.  After your visit with [place] today you will receive a survey in the mail or online from [employer] regarding your care with [place]. Please take a moment to fill this out. Your feedback is very important to [place] as you can help [place] better understand your patient needs as well as improve  your experience and satisfaction. WE CARE ABOUT YOU!!!   Your body mass index is 20 with the 8 pound weight loss that you have had by following your wife's diabetic diet. I've encouraged him to liberalize his diet some more so that he does not continue to lose weight. He understands this. This summer he will continue to drink plenty of fluids and stay well hydrated We will call with the urinalysis result as soon as it becomes available Take Tylenol as needed for right shoulder pain

## 2015-08-23 ENCOUNTER — Other Ambulatory Visit: Payer: Medicare HMO

## 2015-08-23 DIAGNOSIS — Z0000 Encounter for general adult medical examination without abnormal findings: Secondary | ICD-10-CM | POA: Diagnosis not present

## 2015-08-23 DIAGNOSIS — Z1211 Encounter for screening for malignant neoplasm of colon: Secondary | ICD-10-CM

## 2015-08-23 LAB — CBC WITH DIFFERENTIAL/PLATELET
Basophils Absolute: 0 10*3/uL (ref 0.0–0.2)
Basos: 0 %
EOS (ABSOLUTE): 0.1 10*3/uL (ref 0.0–0.4)
EOS: 2 %
HEMATOCRIT: 35.8 % — AB (ref 37.5–51.0)
Hemoglobin: 11.9 g/dL — ABNORMAL LOW (ref 12.6–17.7)
Immature Grans (Abs): 0 10*3/uL (ref 0.0–0.1)
Immature Granulocytes: 0 %
LYMPHS ABS: 1.2 10*3/uL (ref 0.7–3.1)
Lymphs: 20 %
MCH: 31.2 pg (ref 26.6–33.0)
MCHC: 33.2 g/dL (ref 31.5–35.7)
MCV: 94 fL (ref 79–97)
MONOS ABS: 0.6 10*3/uL (ref 0.1–0.9)
Monocytes: 10 %
Neutrophils Absolute: 4.2 10*3/uL (ref 1.4–7.0)
Neutrophils: 68 %
Platelets: 268 10*3/uL (ref 150–379)
RBC: 3.82 x10E6/uL — AB (ref 4.14–5.80)
RDW: 13 % (ref 12.3–15.4)
WBC: 6.1 10*3/uL (ref 3.4–10.8)

## 2015-08-23 LAB — HEPATIC FUNCTION PANEL
ALBUMIN: 4.2 g/dL (ref 3.5–4.7)
ALT: 13 IU/L (ref 0–44)
AST: 19 IU/L (ref 0–40)
Alkaline Phosphatase: 38 IU/L — ABNORMAL LOW (ref 39–117)
Bilirubin Total: 0.8 mg/dL (ref 0.0–1.2)
Bilirubin, Direct: 0.25 mg/dL (ref 0.00–0.40)
Total Protein: 6.4 g/dL (ref 6.0–8.5)

## 2015-08-23 LAB — LIPID PANEL
CHOLESTEROL TOTAL: 108 mg/dL (ref 100–199)
Chol/HDL Ratio: 1.9 ratio units (ref 0.0–5.0)
HDL: 57 mg/dL (ref >39)
LDL Calculated: 36 mg/dL (ref 0–99)
TRIGLYCERIDES: 76 mg/dL (ref 0–149)
VLDL Cholesterol Cal: 15 mg/dL (ref 5–40)

## 2015-08-23 LAB — BMP8+EGFR
BUN / CREAT RATIO: 17 (ref 10–24)
BUN: 24 mg/dL (ref 8–27)
CO2: 24 mmol/L (ref 18–29)
CREATININE: 1.44 mg/dL — AB (ref 0.76–1.27)
Calcium: 10.1 mg/dL (ref 8.6–10.2)
Chloride: 101 mmol/L (ref 96–106)
GFR calc Af Amer: 51 mL/min/{1.73_m2} — ABNORMAL LOW (ref >59)
GFR calc non Af Amer: 44 mL/min/{1.73_m2} — ABNORMAL LOW (ref >59)
GLUCOSE: 93 mg/dL (ref 65–99)
Potassium: 3.7 mmol/L (ref 3.5–5.2)
SODIUM: 142 mmol/L (ref 134–144)

## 2015-08-23 LAB — VITAMIN D 25 HYDROXY (VIT D DEFICIENCY, FRACTURES): Vit D, 25-Hydroxy: 48.6 ng/mL (ref 30.0–100.0)

## 2015-08-24 ENCOUNTER — Telehealth: Payer: Self-pay | Admitting: *Deleted

## 2015-08-24 NOTE — Telephone Encounter (Signed)
-----   Message from Chipper Herb, MD sent at 08/23/2015  7:23 AM EDT ----- The blood sugar is good at 93. The creatinine, the most important kidney function test is slightly elevated at 1.44 and this is consistent with readings over the past year. The patient should continue to avoid all NSAIDs like ibuprofen and Aleve and only take Tylenol if needed for aches and pains. The electrolytes including potassium are good. The CBC has a normal white blood cell count. The hemoglobin is slightly decreased compared to past readings at 11.9. Please make sure that he  returns the FOBT that was given to him at the visit +++++. The CBC should be repeated in 4 weeks.++++ The platelet count is adequate. All liver function tests are within normal limits except one is slightly decreased and this is consistent with past readings. Cholesterol numbers with traditional lipid testing have an LDL C cholesterol that is excellent at 36. Triglycerides are good at 76. The HDL cholesterol is excellent at 57. The patient can reduce his Crestor to Monday Wednesday and Friday only+++++ and continue with aggressive therapeutic lifestyle changes which include diet and exercise but he should liberalize his diet a little bit because of his low BMI. This was discussed with him at the visit. The vitamin D level was good at 48.6 and he should continue with current treatment

## 2015-08-24 NOTE — Addendum Note (Signed)
Addended by: Thana Ates on: 08/24/2015 10:29 AM   Modules accepted: Orders

## 2015-08-26 LAB — FECAL OCCULT BLOOD, IMMUNOCHEMICAL: Fecal Occult Bld: NEGATIVE

## 2015-08-31 ENCOUNTER — Other Ambulatory Visit: Payer: Self-pay | Admitting: Family Medicine

## 2015-09-10 ENCOUNTER — Other Ambulatory Visit: Payer: Self-pay | Admitting: Family Medicine

## 2015-09-21 ENCOUNTER — Other Ambulatory Visit: Payer: Medicare HMO

## 2015-09-21 DIAGNOSIS — D649 Anemia, unspecified: Secondary | ICD-10-CM | POA: Diagnosis not present

## 2015-09-22 ENCOUNTER — Other Ambulatory Visit: Payer: Self-pay | Admitting: Family Medicine

## 2015-09-22 LAB — CBC WITH DIFFERENTIAL/PLATELET
BASOS ABS: 0 10*3/uL (ref 0.0–0.2)
Basos: 1 %
EOS (ABSOLUTE): 0.2 10*3/uL (ref 0.0–0.4)
Eos: 4 %
Hematocrit: 35.6 % — ABNORMAL LOW (ref 37.5–51.0)
Hemoglobin: 11.5 g/dL — ABNORMAL LOW (ref 12.6–17.7)
IMMATURE GRANS (ABS): 0 10*3/uL (ref 0.0–0.1)
Immature Granulocytes: 0 %
LYMPHS ABS: 1.3 10*3/uL (ref 0.7–3.1)
LYMPHS: 24 %
MCH: 30.7 pg (ref 26.6–33.0)
MCHC: 32.3 g/dL (ref 31.5–35.7)
MCV: 95 fL (ref 79–97)
Monocytes Absolute: 0.4 10*3/uL (ref 0.1–0.9)
Monocytes: 7 %
NEUTROS ABS: 3.6 10*3/uL (ref 1.4–7.0)
Neutrophils: 64 %
PLATELETS: 285 10*3/uL (ref 150–379)
RBC: 3.75 x10E6/uL — ABNORMAL LOW (ref 4.14–5.80)
RDW: 13.2 % (ref 12.3–15.4)
WBC: 5.6 10*3/uL (ref 3.4–10.8)

## 2015-09-25 ENCOUNTER — Other Ambulatory Visit: Payer: Medicare HMO

## 2015-09-25 ENCOUNTER — Other Ambulatory Visit: Payer: Self-pay | Admitting: *Deleted

## 2015-09-25 ENCOUNTER — Telehealth: Payer: Self-pay | Admitting: Family Medicine

## 2015-09-25 DIAGNOSIS — R710 Precipitous drop in hematocrit: Secondary | ICD-10-CM

## 2015-09-25 DIAGNOSIS — D649 Anemia, unspecified: Secondary | ICD-10-CM | POA: Diagnosis not present

## 2015-09-26 ENCOUNTER — Other Ambulatory Visit: Payer: Self-pay | Admitting: *Deleted

## 2015-09-26 ENCOUNTER — Other Ambulatory Visit: Payer: Medicare HMO

## 2015-09-26 DIAGNOSIS — R710 Precipitous drop in hematocrit: Secondary | ICD-10-CM

## 2015-09-26 DIAGNOSIS — D649 Anemia, unspecified: Secondary | ICD-10-CM | POA: Diagnosis not present

## 2015-09-26 LAB — ANEMIA PROFILE B
BASOS ABS: 0 10*3/uL (ref 0.0–0.2)
Basos: 0 %
EOS (ABSOLUTE): 0.2 10*3/uL (ref 0.0–0.4)
EOS: 2 %
Ferritin: 334 ng/mL (ref 30–400)
Folate: >20 ng/mL (ref >3.0)
HEMOGLOBIN: 11.3 g/dL — AB (ref 12.6–17.7)
Hematocrit: 34.6 % — ABNORMAL LOW (ref 37.5–51.0)
IMMATURE GRANS (ABS): 0 10*3/uL (ref 0.0–0.1)
IMMATURE GRANULOCYTES: 0 %
IRON SATURATION: 33 % (ref 15–55)
IRON: 96 ug/dL (ref 38–169)
LYMPHS: 22 %
Lymphocytes Absolute: 1.9 10*3/uL (ref 0.7–3.1)
MCH: 30.5 pg (ref 26.6–33.0)
MCHC: 32.7 g/dL (ref 31.5–35.7)
MCV: 94 fL (ref 79–97)
MONOCYTES: 6 %
Monocytes Absolute: 0.5 10*3/uL (ref 0.1–0.9)
NEUTROS PCT: 70 %
Neutrophils Absolute: 6.2 10*3/uL (ref 1.4–7.0)
PLATELETS: 298 10*3/uL (ref 150–379)
RBC: 3.7 x10E6/uL — ABNORMAL LOW (ref 4.14–5.80)
RDW: 13.1 % (ref 12.3–15.4)
Retic Ct Pct: 1 % (ref 0.6–2.6)
TIBC: 292 ug/dL (ref 250–450)
UIBC: 196 ug/dL (ref 111–343)
Vitamin B-12: 242 pg/mL (ref 211–946)
WBC: 8.8 10*3/uL (ref 3.4–10.8)

## 2015-09-26 MED ORDER — INTEGRA PLUS PO CAPS: 1.0000 | ORAL_CAPSULE | Freq: Every day | ORAL | 2 refills | Status: DC

## 2015-09-27 ENCOUNTER — Other Ambulatory Visit: Payer: Self-pay | Admitting: Family Medicine

## 2015-09-28 LAB — FECAL OCCULT BLOOD, IMMUNOCHEMICAL: FECAL OCCULT BLD: NEGATIVE

## 2015-10-22 ENCOUNTER — Other Ambulatory Visit (INDEPENDENT_AMBULATORY_CARE_PROVIDER_SITE_OTHER): Payer: Medicare HMO

## 2015-10-22 DIAGNOSIS — R710 Precipitous drop in hematocrit: Secondary | ICD-10-CM | POA: Diagnosis not present

## 2015-10-23 LAB — CBC WITH DIFFERENTIAL/PLATELET
Basophils Absolute: 0 10*3/uL (ref 0.0–0.2)
Basos: 0 %
EOS (ABSOLUTE): 0.2 10*3/uL (ref 0.0–0.4)
EOS: 2 %
HEMATOCRIT: 35.6 % — AB (ref 37.5–51.0)
Hemoglobin: 11.7 g/dL — ABNORMAL LOW (ref 12.6–17.7)
IMMATURE GRANULOCYTES: 0 %
Immature Grans (Abs): 0 10*3/uL (ref 0.0–0.1)
LYMPHS: 20 %
Lymphocytes Absolute: 1.7 10*3/uL (ref 0.7–3.1)
MCH: 31.2 pg (ref 26.6–33.0)
MCHC: 32.9 g/dL (ref 31.5–35.7)
MCV: 95 fL (ref 79–97)
MONOS ABS: 0.5 10*3/uL (ref 0.1–0.9)
Monocytes: 6 %
NEUTROS PCT: 72 %
Neutrophils Absolute: 5.9 10*3/uL (ref 1.4–7.0)
Platelets: 301 10*3/uL (ref 150–379)
RBC: 3.75 x10E6/uL — AB (ref 4.14–5.80)
RDW: 13.5 % (ref 12.3–15.4)
WBC: 8.3 10*3/uL (ref 3.4–10.8)

## 2015-11-08 DIAGNOSIS — R69 Illness, unspecified: Secondary | ICD-10-CM | POA: Diagnosis not present

## 2015-12-05 ENCOUNTER — Telehealth: Payer: Self-pay

## 2015-12-05 NOTE — Telephone Encounter (Signed)
Patient reports he has been experiencing cramping in his lower legs and aching in his knees for the last couple of weeks.  He said his only new medication recently has been Integra Plus.   He is unsure of what could be causing this and would like to know what we recommend.  Please advise and route to pools.

## 2015-12-05 NOTE — Telephone Encounter (Signed)
Cramping could be due to an electrolyte abnormality, this is a multivitamin that he is taking and if one of those too high sometimes it could cause that issue but the only way to know for sure would be to. The cramping can be caused by a lot of different issues. First I would have a make sure he staying well hydrated, but if it persists after that and have hercome in for visit and we will discuss all the possibilities.

## 2015-12-05 NOTE — Telephone Encounter (Signed)
Pt aware.

## 2015-12-10 ENCOUNTER — Other Ambulatory Visit: Payer: Medicare HMO

## 2015-12-10 DIAGNOSIS — E7849 Other hyperlipidemia: Secondary | ICD-10-CM

## 2015-12-10 DIAGNOSIS — I10 Essential (primary) hypertension: Secondary | ICD-10-CM

## 2015-12-10 DIAGNOSIS — E784 Other hyperlipidemia: Secondary | ICD-10-CM | POA: Diagnosis not present

## 2015-12-11 LAB — CBC WITH DIFFERENTIAL/PLATELET
BASOS: 0 %
Basophils Absolute: 0 10*3/uL (ref 0.0–0.2)
EOS (ABSOLUTE): 0.2 10*3/uL (ref 0.0–0.4)
EOS: 2 %
HEMATOCRIT: 34.2 % — AB (ref 37.5–51.0)
Hemoglobin: 11.5 g/dL — ABNORMAL LOW (ref 12.6–17.7)
IMMATURE GRANULOCYTES: 0 %
Immature Grans (Abs): 0 10*3/uL (ref 0.0–0.1)
LYMPHS ABS: 1.5 10*3/uL (ref 0.7–3.1)
Lymphs: 20 %
MCH: 30.9 pg (ref 26.6–33.0)
MCHC: 33.6 g/dL (ref 31.5–35.7)
MCV: 92 fL (ref 79–97)
MONOS ABS: 0.4 10*3/uL (ref 0.1–0.9)
Monocytes: 6 %
NEUTROS ABS: 5.3 10*3/uL (ref 1.4–7.0)
Neutrophils: 72 %
PLATELETS: 289 10*3/uL (ref 150–379)
RBC: 3.72 x10E6/uL — ABNORMAL LOW (ref 4.14–5.80)
RDW: 13.5 % (ref 12.3–15.4)
WBC: 7.4 10*3/uL (ref 3.4–10.8)

## 2015-12-11 LAB — BMP8+EGFR
BUN / CREAT RATIO: 15 (ref 10–24)
BUN: 23 mg/dL (ref 8–27)
CO2: 24 mmol/L (ref 18–29)
CREATININE: 1.5 mg/dL — AB (ref 0.76–1.27)
Calcium: 9.3 mg/dL (ref 8.6–10.2)
Chloride: 104 mmol/L (ref 96–106)
GFR, EST AFRICAN AMERICAN: 48 mL/min/{1.73_m2} — AB (ref >59)
GFR, EST NON AFRICAN AMERICAN: 42 mL/min/{1.73_m2} — AB (ref >59)
Glucose: 96 mg/dL (ref 65–99)
POTASSIUM: 4 mmol/L (ref 3.5–5.2)
SODIUM: 145 mmol/L — AB (ref 134–144)

## 2015-12-11 LAB — LIPID PANEL
CHOL/HDL RATIO: 2.1 ratio (ref 0.0–5.0)
Cholesterol, Total: 124 mg/dL (ref 100–199)
HDL: 58 mg/dL (ref >39)
LDL Calculated: 52 mg/dL (ref 0–99)
Triglycerides: 71 mg/dL (ref 0–149)
VLDL Cholesterol Cal: 14 mg/dL (ref 5–40)

## 2015-12-18 ENCOUNTER — Other Ambulatory Visit: Payer: Self-pay | Admitting: Family Medicine

## 2016-02-11 ENCOUNTER — Other Ambulatory Visit: Payer: Self-pay | Admitting: Family Medicine

## 2016-02-21 ENCOUNTER — Other Ambulatory Visit: Payer: Medicare HMO

## 2016-02-21 DIAGNOSIS — I10 Essential (primary) hypertension: Secondary | ICD-10-CM

## 2016-02-21 DIAGNOSIS — E7800 Pure hypercholesterolemia, unspecified: Secondary | ICD-10-CM | POA: Diagnosis not present

## 2016-02-21 DIAGNOSIS — E559 Vitamin D deficiency, unspecified: Secondary | ICD-10-CM | POA: Diagnosis not present

## 2016-02-21 DIAGNOSIS — I700 Atherosclerosis of aorta: Secondary | ICD-10-CM | POA: Diagnosis not present

## 2016-02-22 ENCOUNTER — Encounter: Payer: Self-pay | Admitting: Family Medicine

## 2016-02-22 ENCOUNTER — Ambulatory Visit (INDEPENDENT_AMBULATORY_CARE_PROVIDER_SITE_OTHER): Payer: Medicare HMO

## 2016-02-22 ENCOUNTER — Ambulatory Visit (INDEPENDENT_AMBULATORY_CARE_PROVIDER_SITE_OTHER): Payer: Medicare HMO | Admitting: Family Medicine

## 2016-02-22 VITALS — BP 140/72 | HR 75 | Temp 96.8°F | Ht 70.5 in | Wt 138.0 lb

## 2016-02-22 DIAGNOSIS — I700 Atherosclerosis of aorta: Secondary | ICD-10-CM

## 2016-02-22 DIAGNOSIS — I10 Essential (primary) hypertension: Secondary | ICD-10-CM

## 2016-02-22 DIAGNOSIS — E7800 Pure hypercholesterolemia, unspecified: Secondary | ICD-10-CM

## 2016-02-22 DIAGNOSIS — E559 Vitamin D deficiency, unspecified: Secondary | ICD-10-CM

## 2016-02-22 DIAGNOSIS — C61 Malignant neoplasm of prostate: Secondary | ICD-10-CM | POA: Diagnosis not present

## 2016-02-22 LAB — BMP8+EGFR
BUN / CREAT RATIO: 21 (ref 10–24)
BUN: 28 mg/dL — AB (ref 8–27)
CHLORIDE: 102 mmol/L (ref 96–106)
CO2: 22 mmol/L (ref 18–29)
Calcium: 9.7 mg/dL (ref 8.6–10.2)
Creatinine, Ser: 1.33 mg/dL — ABNORMAL HIGH (ref 0.76–1.27)
GFR calc Af Amer: 56 mL/min/{1.73_m2} — ABNORMAL LOW (ref >59)
GFR calc non Af Amer: 48 mL/min/{1.73_m2} — ABNORMAL LOW (ref >59)
GLUCOSE: 98 mg/dL (ref 65–99)
Potassium: 3.7 mmol/L (ref 3.5–5.2)
Sodium: 145 mmol/L — ABNORMAL HIGH (ref 134–144)

## 2016-02-22 LAB — HEPATIC FUNCTION PANEL
ALBUMIN: 4.2 g/dL (ref 3.5–4.7)
ALT: 12 IU/L (ref 0–44)
AST: 21 IU/L (ref 0–40)
Alkaline Phosphatase: 41 IU/L (ref 39–117)
Bilirubin Total: 0.6 mg/dL (ref 0.0–1.2)
Bilirubin, Direct: 0.19 mg/dL (ref 0.00–0.40)
TOTAL PROTEIN: 6.6 g/dL (ref 6.0–8.5)

## 2016-02-22 LAB — CBC WITH DIFFERENTIAL/PLATELET
BASOS ABS: 0 10*3/uL (ref 0.0–0.2)
Basos: 0 %
EOS (ABSOLUTE): 0.2 10*3/uL (ref 0.0–0.4)
Eos: 3 %
HEMOGLOBIN: 11.7 g/dL — AB (ref 13.0–17.7)
Hematocrit: 35.5 % — ABNORMAL LOW (ref 37.5–51.0)
IMMATURE GRANULOCYTES: 0 %
Immature Grans (Abs): 0 10*3/uL (ref 0.0–0.1)
LYMPHS ABS: 1.6 10*3/uL (ref 0.7–3.1)
Lymphs: 22 %
MCH: 30.5 pg (ref 26.6–33.0)
MCHC: 33 g/dL (ref 31.5–35.7)
MCV: 92 fL (ref 79–97)
MONOCYTES: 6 %
MONOS ABS: 0.4 10*3/uL (ref 0.1–0.9)
NEUTROS PCT: 69 %
Neutrophils Absolute: 5 10*3/uL (ref 1.4–7.0)
Platelets: 285 10*3/uL (ref 150–379)
RBC: 3.84 x10E6/uL — AB (ref 4.14–5.80)
RDW: 13.6 % (ref 12.3–15.4)
WBC: 7.2 10*3/uL (ref 3.4–10.8)

## 2016-02-22 NOTE — Progress Notes (Signed)
Subjective:    [name], male    DOB: [birth year], 83 y.o.   MRN: [id number]  HPI Pt here for follow up and management of chronic medical problems which include hypertension and hyperlipidemia. He is taking medication regularly.The patient complains of lower bilateral leg pain in the morning. He is due to get a chest x-ray today. He has had lab work done but we usually do a cholesterol but it was too early for this. This will be reviewed with him during the visit today. All liver function tests were normal. The creatinine, the most important kidney function test remains slightly elevated as it has been in the past. Her sugar was good at 98 and the electrolytes were good except the serum sodium was elevated by 1 point. The white blood cell count was normal and the hemoglobin was stable at 11.7 and this is been the case in the past. The platelet count was adequate. Patient did have a lipid panel done the end of November and the LDL C cholesterol that time was excellent along with the triglycerides and total cholesterol.    Patient Active Problem List   Diagnosis Date Noted  . Prostate cancer ([place]) 08/22/2015  . Vitamin D deficiency 02/21/2015  . Elevated serum creatinine 08/15/2014  . Thoracic aortic atherosclerosis ([place]) 02/07/2014  . History of prostate cancer 09/06/2012  . HTN (hypertension) 01/20/2012  . Hyperlipidemia 01/20/2012  . Cardiovascular risk factor 01/20/2012  . PVC's (premature ventricular contractions) 01/20/2012   Outpatient Encounter Prescriptions as of 02/22/2016  Medication Sig  . amLODipine (NORVASC) 5 MG tablet TAKE 1 TABLET BY MOUTH EVERY DAY  . aspirin 81 MG tablet Take 81 mg by mouth. Monday, Wednesday, Friday  . benazepril (LOTENSIN) 20 MG tablet TAKE 1 TABLET EVERY DAY  . Cholecalciferol 1000 UNITS capsule Take 1,000 Units by mouth daily.  Marland Kitchen FeFum-FePoly-FA-B Cmp-C-Biot (INTEGRA PLUS) CAPS TAKE 1 CAPSULE BY MOUTH DAILY.  . fenofibrate 54 MG  tablet TAKE 1 TABLET BY MOUTH EVERY DAY  . fish oil-omega-3 fatty acids 1000 MG capsule Take 1 g by mouth daily.  . hydrochlorothiazide (HYDRODIURIL) 25 MG tablet TAKE 1 TABLET BY MOUTH EVERY DAY  . rosuvastatin (CRESTOR) 5 MG tablet TAKE 1 TABLET (5 MG TOTAL) BY MOUTH DAILY. AS DIRECTED  . vitamin C (ASCORBIC ACID) 500 MG tablet Take 500 mg by mouth daily.  . [DISCONTINUED] amLODipine (NORVASC) 5 MG tablet TAKE 1 TABLET BY MOUTH EVERY DAY  . [DISCONTINUED] fenofibrate 54 MG tablet TAKE 1 TABLET (54 MG TOTAL) BY MOUTH DAILY.  . [DISCONTINUED] rosuvastatin (CRESTOR) 5 MG tablet TAKE 1 TABLET (5 MG TOTAL) BY MOUTH DAILY. AS DIRECTED   No facility-administered encounter medications on file as of 02/22/2016.      Review of Systems  Constitutional: Negative.   HENT: Negative.   Eyes: Negative.   Respiratory: Negative.   Cardiovascular: Negative.   Gastrointestinal: Negative.   Endocrine: Negative.   Genitourinary: Negative.   Musculoskeletal: Positive for myalgias (bilateral lower leg soreness in the mornings).  Skin: Negative.   Allergic/Immunologic: Negative.   Neurological: Negative.   Hematological: Negative.   Psychiatric/Behavioral: Negative.        Objective:   Physical Exam  Constitutional: He is oriented to person, place, and time. He appears well-developed and well-nourished. No distress.  Pleasant and alert, the patient indicates that his home weights have been stable  HENT:  Head: Normocephalic and atraumatic.  Right Ear: External ear normal.  Left  Ear: External ear normal.  Nose: Nose normal.  Mouth/Throat: Oropharynx is clear and moist. No oropharyngeal exudate.  Eyes: Conjunctivae and EOM are normal. Pupils are equal, round, and reactive to light. Right eye exhibits no discharge. Left eye exhibits no discharge. No scleral icterus.  Neck: Normal range of motion. Neck supple. No tracheal deviation present. No thyromegaly present.  Cardiovascular: Normal rate, regular  rhythm, normal heart sounds and intact distal pulses.   No murmur heard. The heart was regular at 84/m  Pulmonary/Chest: Effort normal and breath sounds normal. No respiratory distress. He has no wheezes. He has no rales. He exhibits no tenderness.  No axillary adenopathy and good breath sounds anteriorly and posteriorly  Abdominal: Soft. Bowel sounds are normal. He exhibits no mass. There is no tenderness. There is no rebound and no guarding.  No abdominal tenderness masses bruits or organ enlargement  Musculoskeletal: Normal range of motion. He exhibits no edema.  Lymphadenopathy:    He has no cervical adenopathy.  Neurological: He is alert and oriented to person, place, and time. He has normal reflexes. No cranial nerve deficit.  Skin: Skin is warm and dry. No rash noted.  Psychiatric: He has a normal mood and affect. His behavior is normal. Judgment and thought content normal.  Nursing note and vitals reviewed.  BP 140/72 (BP Location: Left Arm)   Pulse 75   Temp (!) 96.8 F (36 C) (Oral)   Ht 5' 10.5" (1.791 m)   Wt 138 lb (62.6 kg)   BMI 19.52 kg/m         Assessment & Plan:  1. Essential hypertension -The blood pressure is good today and he will continue with current treatment - DG Chest 2 View; Future  2. Pure hypercholesterolemia -Continue with current treatment which is a reduced dose of a statin drug from what he is had in the past  - DG Chest 2 View; Future  3. Vitamin D deficiency -Continue current treatment  4. Thoracic aortic atherosclerosis ([facility]) -Continue statin drug as currently taking and therapeutic lifestyle changes  5. Prostate cancer [facility]) -Follow-up with urology as planned  Patient Instructions                       Medicare Annual Wellness Visit  Benjamin Buchanan and the medical providers at [facility] strive to bring you the best medical care.  In doing so we not only want to address your current medical conditions and  concerns but also to detect new conditions early and prevent illness, disease and health-related problems.    Medicare offers a yearly Wellness Visit which allows our clinical staff to assess your need for preventative services including immunizations, lifestyle education, counseling to decrease risk of preventable diseases and screening for fall risk and other medical concerns.    This visit is provided free of charge (no copay) for all Medicare recipients. The clinical pharmacists at [facility] have begun to conduct these Wellness Visits which will also include a thorough review of all your medications.    As you primary medical provider recommend that you make an appointment for your Annual Wellness Visit if you have not done so already this year.  You may set up this appointment before you leave today or you may call back [phone] and schedule an appointment.  Please make sure when you call that you mention that you are scheduling your Annual Wellness Visit with the clinical pharmacist so that  the appointment may be made for the proper length of time.    Continue current medications. Continue good therapeutic lifestyle changes which include good diet and exercise. Fall precautions discussed with patient. If an FOBT was given today- please return it to our front desk. If you are over 75 years old - you may need Prevnar 17 or the adult Pneumonia vaccine.  **Flu shots are available--- please call and schedule a FLU-CLINIC appointment**  After your visit with [place] today you will receive a survey in the mail or online from [employer] regarding your care with [place]. Please take a moment to fill this out. Your feedback is very important to [place] as you can help [place] better understand your patient needs as well as improve your experience and satisfaction. WE CARE ABOUT YOU!!!   The patient will try some boost her insurer 3 times weekly in addition to his regular eating habits. He  should continue to practice good pulmonary and hand hygiene. Drink plenty of water and stay well hydrated    Arrie Senate MD

## 2016-02-22 NOTE — Patient Instructions (Addendum)
Medicare Annual Wellness Visit  [facility] and the medical providers at [facility] strive to bring you the best medical care.  In doing so we not only want to address your current medical conditions and concerns but also to detect new conditions early and prevent illness, disease and health-related problems.    Medicare offers a yearly Wellness Visit which allows our clinical staff to assess your need for preventative services including immunizations, lifestyle education, counseling to decrease risk of preventable diseases and screening for fall risk and other medical concerns.    This visit is provided free of charge (no copay) for all Medicare recipients. The clinical pharmacists at [facility] have begun to conduct these Wellness Visits which will also include a thorough review of all your medications.    As you primary medical provider recommend that you make an appointment for your Annual Wellness Visit if you have not done so already this year.  You may set up this appointment before you leave today or you may call back [phone] and schedule an appointment.  Please make sure when you call that you mention that you are scheduling your Annual Wellness Visit with the clinical pharmacist so that the appointment may be made for the proper length of time.    Continue current medications. Continue good therapeutic lifestyle changes which include good diet and exercise. Fall precautions discussed with patient. If an FOBT was given today- please return it to our front desk. If you are over 27 years old - you may need Prevnar 64 or the adult Pneumonia vaccine.  **Flu shots are available--- please call and schedule a FLU-CLINIC appointment**  After your visit with [place] today you will receive a survey in the mail or online from [employer] regarding your care with [place]. Please take a moment to fill this out. Your feedback is very  important to [place] as you can help [place] better understand your patient needs as well as improve your experience and satisfaction. WE CARE ABOUT YOU!!!   The patient will try some boost her insurer 3 times weekly in addition to his regular eating habits. He should continue to practice good pulmonary and hand hygiene. Drink plenty of water and stay well hydrated

## 2016-02-24 LAB — SPECIMEN STATUS REPORT

## 2016-02-24 LAB — CK: CK TOTAL: 175 U/L (ref 24–204)

## 2016-02-26 ENCOUNTER — Other Ambulatory Visit: Payer: Self-pay | Admitting: Family Medicine

## 2016-03-07 ENCOUNTER — Other Ambulatory Visit: Payer: Self-pay | Admitting: Family Medicine

## 2016-03-10 ENCOUNTER — Other Ambulatory Visit: Payer: Self-pay | Admitting: Family Medicine

## 2016-03-17 ENCOUNTER — Other Ambulatory Visit: Payer: Self-pay | Admitting: Family Medicine

## 2016-03-18 ENCOUNTER — Telehealth: Payer: Self-pay | Admitting: Family Medicine

## 2016-03-18 NOTE — Telephone Encounter (Signed)
Patient aware to take medication how he has been taking

## 2016-06-14 ENCOUNTER — Other Ambulatory Visit: Payer: Self-pay | Admitting: Family Medicine

## 2016-06-24 ENCOUNTER — Other Ambulatory Visit: Payer: Self-pay | Admitting: Family Medicine

## 2016-07-08 ENCOUNTER — Other Ambulatory Visit: Payer: Medicare HMO

## 2016-07-08 DIAGNOSIS — I10 Essential (primary) hypertension: Secondary | ICD-10-CM

## 2016-07-08 DIAGNOSIS — E7800 Pure hypercholesterolemia, unspecified: Secondary | ICD-10-CM

## 2016-07-08 DIAGNOSIS — D649 Anemia, unspecified: Secondary | ICD-10-CM | POA: Diagnosis not present

## 2016-07-08 LAB — LIPID PANEL
CHOLESTEROL TOTAL: 125 mg/dL (ref 100–199)
Chol/HDL Ratio: 2 ratio (ref 0.0–5.0)
HDL: 64 mg/dL (ref >39)
LDL CALC: 46 mg/dL (ref 0–99)
TRIGLYCERIDES: 74 mg/dL (ref 0–149)
VLDL Cholesterol Cal: 15 mg/dL (ref 5–40)

## 2016-07-08 LAB — BMP8+EGFR
BUN/Creatinine Ratio: 18 (ref 10–24)
BUN: 24 mg/dL (ref 8–27)
CO2: 25 mmol/L (ref 20–29)
CREATININE: 1.3 mg/dL — AB (ref 0.76–1.27)
Calcium: 9.4 mg/dL (ref 8.6–10.2)
Chloride: 103 mmol/L (ref 96–106)
GFR calc Af Amer: 57 mL/min/{1.73_m2} — ABNORMAL LOW (ref >59)
GFR calc non Af Amer: 49 mL/min/{1.73_m2} — ABNORMAL LOW (ref >59)
GLUCOSE: 97 mg/dL (ref 65–99)
Potassium: 3.5 mmol/L (ref 3.5–5.2)
SODIUM: 145 mmol/L — AB (ref 134–144)

## 2016-07-08 LAB — CBC WITH DIFFERENTIAL/PLATELET
Basophils Absolute: 0 10*3/uL (ref 0.0–0.2)
Basos: 0 %
EOS (ABSOLUTE): 0.1 10*3/uL (ref 0.0–0.4)
Eos: 1 %
HEMOGLOBIN: 11.4 g/dL — AB (ref 13.0–17.7)
Hematocrit: 34.9 % — ABNORMAL LOW (ref 37.5–51.0)
IMMATURE GRANS (ABS): 0 10*3/uL (ref 0.0–0.1)
IMMATURE GRANULOCYTES: 0 %
LYMPHS: 20 %
Lymphocytes Absolute: 1.4 10*3/uL (ref 0.7–3.1)
MCH: 31.2 pg (ref 26.6–33.0)
MCHC: 32.7 g/dL (ref 31.5–35.7)
MCV: 96 fL (ref 79–97)
MONOCYTES: 6 %
Monocytes Absolute: 0.4 10*3/uL (ref 0.1–0.9)
NEUTROS PCT: 73 %
Neutrophils Absolute: 5.1 10*3/uL (ref 1.4–7.0)
Platelets: 300 10*3/uL (ref 150–379)
RBC: 3.65 x10E6/uL — AB (ref 4.14–5.80)
RDW: 12.7 % (ref 12.3–15.4)
WBC: 7 10*3/uL (ref 3.4–10.8)

## 2016-07-10 ENCOUNTER — Ambulatory Visit (INDEPENDENT_AMBULATORY_CARE_PROVIDER_SITE_OTHER): Payer: Medicare HMO | Admitting: Family Medicine

## 2016-07-10 ENCOUNTER — Encounter: Payer: Self-pay | Admitting: Family Medicine

## 2016-07-10 VITALS — BP 130/68 | HR 84 | Temp 97.3°F | Ht 70.5 in | Wt 133.0 lb

## 2016-07-10 DIAGNOSIS — I10 Essential (primary) hypertension: Secondary | ICD-10-CM | POA: Diagnosis not present

## 2016-07-10 DIAGNOSIS — E559 Vitamin D deficiency, unspecified: Secondary | ICD-10-CM | POA: Diagnosis not present

## 2016-07-10 DIAGNOSIS — E7800 Pure hypercholesterolemia, unspecified: Secondary | ICD-10-CM

## 2016-07-10 DIAGNOSIS — K4090 Unilateral inguinal hernia, without obstruction or gangrene, not specified as recurrent: Secondary | ICD-10-CM | POA: Diagnosis not present

## 2016-07-10 DIAGNOSIS — I700 Atherosclerosis of aorta: Secondary | ICD-10-CM

## 2016-07-10 DIAGNOSIS — C61 Malignant neoplasm of prostate: Secondary | ICD-10-CM

## 2016-07-10 LAB — HEPATIC FUNCTION PANEL
ALBUMIN: 4.2 g/dL (ref 3.5–4.7)
ALT: 21 IU/L (ref 0–44)
AST: 20 IU/L (ref 0–40)
Alkaline Phosphatase: 41 IU/L (ref 39–117)
Bilirubin Total: 0.4 mg/dL (ref 0.0–1.2)
Bilirubin, Direct: 0.13 mg/dL (ref 0.00–0.40)
Total Protein: 6.2 g/dL (ref 6.0–8.5)

## 2016-07-10 LAB — SPECIMEN STATUS REPORT

## 2016-07-10 NOTE — Addendum Note (Signed)
Addended by: Zannie Cove on: 07/10/2016 09:08 AM   Modules accepted: Orders

## 2016-07-10 NOTE — Patient Instructions (Addendum)
Medicare Annual Wellness Visit  [facility] and the medical providers at [facility] strive to bring you the best medical care.  In doing so we not only want to address your current medical conditions and concerns but also to detect new conditions early and prevent illness, disease and health-related problems.    Medicare offers a yearly Wellness Visit which allows our clinical staff to assess your need for preventative services including immunizations, lifestyle education, counseling to decrease risk of preventable diseases and screening for fall risk and other medical concerns.    This visit is provided free of charge (no copay) for all Medicare recipients. The clinical pharmacists at [facility] have begun to conduct these Wellness Visits which will also include a thorough review of all your medications.    As you primary medical provider recommend that you make an appointment for your Annual Wellness Visit if you have not done so already this year.  You may set up this appointment before you leave today or you may call back [phone]) and schedule an appointment.  Please make sure when you call that you mention that you are scheduling your Annual Wellness Visit with the clinical pharmacist so that the appointment may be made for the proper length of time.     Continue current medications. Continue good therapeutic lifestyle changes which include good diet and exercise. Fall precautions discussed with patient. If an FOBT was given today- please return it to our front desk. If you are over 19 years old - you may need Prevnar 50 or the adult Pneumonia vaccine.  **Flu shots are available--- please call and schedule a FLU-CLINIC appointment**  After your visit with [place] today you will receive a survey in the mail or online from [employer] regarding your care with [place]. Please take a moment to fill this out. Your feedback is very  important to [place] as you can help [place] better understand your patient needs as well as improve your experience and satisfaction. WE CARE ABOUT YOU!!!   We will do a referral to a general surgeon in [place] to further evaluate your left inguinal hernia and we will try to schedule this appointment on a Monday or Friday as you requested. Continue to drink plenty of fluids and stay well hydrated

## 2016-07-10 NOTE — Progress Notes (Signed)
Subjective:    [name], male    DOB: [birth year], 83 y.o.   MRN: [id number]  HPI Pt here for follow up and management of chronic medical problems which includes hyperlipidemia and hypertension. He is taking medication regularly.The patient is doing well overall and as usual has no specific complaints. He does have a history of hypertension chronic kidney disease and hyperlipidemia. He has iron deficiency anemia and stays on iron replacement for this. The patient is pleasant and alert and concerned about his wife who is 54 and has had a ruptured appendix. The patient is stable and looks and acts much younger than his age of 72 years. He denies any chest pain or shortness of breath. He's been helping his wife more and has noticed an uncomfortable feeling in his left lower quadrant which puffs out and is uncomfortable at times. He's passing his water without problems. He is had lab work done and this will be reviewed with him during the visit today. His creatinine remains elevated at 1.30 with a GFR of 49. His electrolytes are good including potassium except the serum sodium is elevated at 145. Cholesterol numbers with traditional lipid testing are all good and at goal. The CBC has a normal white blood cell count. The hemoglobin is stable at 11.4 the platelet count is adequate. Liver function tests were not done with the original lab work and have been added and are still pending.      Patient Active Problem List   Diagnosis Date Noted  . Prostate cancer ([place]) 08/22/2015  . Vitamin D deficiency 02/21/2015  . Elevated serum creatinine 08/15/2014  . Thoracic aortic atherosclerosis ([place]) 02/07/2014  . History of prostate cancer 09/06/2012  . HTN (hypertension) 01/20/2012  . Hyperlipidemia 01/20/2012  . Cardiovascular risk factor 01/20/2012  . PVC's (premature ventricular contractions) 01/20/2012   Outpatient Encounter Prescriptions as of 07/10/2016  Medication Sig  . amLODipine  (NORVASC) 5 MG tablet TAKE 1 TABLET BY MOUTH EVERY DAY  . aspirin 81 MG tablet Take 81 mg by mouth. Monday, Wednesday, Friday  . benazepril (LOTENSIN) 20 MG tablet TAKE 1 TABLET EVERY DAY  . Cholecalciferol 1000 UNITS capsule Take 1,000 Units by mouth daily.  Marland Kitchen FeFum-FePoly-FA-B Cmp-C-Biot (INTEGRA PLUS) CAPS TAKE 1 CAPSULE BY MOUTH DAILY.  . fenofibrate 54 MG tablet TAKE 1 TABLET BY MOUTH EVERY DAY  . fish oil-omega-3 fatty acids 1000 MG capsule Take 1 g by mouth daily.  . hydrochlorothiazide (HYDRODIURIL) 25 MG tablet TAKE 1 TABLET BY MOUTH EVERY DAY  . rosuvastatin (CRESTOR) 5 MG tablet TAKE 1 TABLET (5 MG TOTAL) BY MOUTH DAILY. AS DIRECTED  . rosuvastatin (CRESTOR) 5 MG tablet TAKE 1 TABLET EVERY EVENING  . vitamin C (ASCORBIC ACID) 500 MG tablet Take 500 mg by mouth daily.  . [DISCONTINUED] amLODipine (NORVASC) 5 MG tablet TAKE 1 TABLET BY MOUTH EVERY DAY  . [DISCONTINUED] benazepril (LOTENSIN) 20 MG tablet TAKE 1 TABLET EVERY DAY   No facility-administered encounter medications on file as of 07/10/2016.      Review of Systems  Constitutional: Negative.   HENT: Negative.   Eyes: Negative.   Respiratory: Negative.   Cardiovascular: Negative.   Gastrointestinal: Negative.   Endocrine: Negative.   Genitourinary: Negative.   Musculoskeletal: Negative.   Skin: Negative.   Allergic/Immunologic: Negative.   Neurological: Negative.   Hematological: Negative.   Psychiatric/Behavioral: Negative.        Objective:   Physical Exam  Constitutional: He is  oriented to person, place, and time. He appears well-developed and well-nourished.  The patient is pleasant and relaxed somewhat worried about his wife.  HENT:  Head: Normocephalic and atraumatic.  Right Ear: External ear normal.  Left Ear: External ear normal.  Nose: Nose normal.  Mouth/Throat: Oropharynx is clear and moist. No oropharyngeal exudate.  Eyes: Conjunctivae and EOM are normal. Pupils are equal, round, and reactive to  light. Right eye exhibits no discharge. Left eye exhibits no discharge. No scleral icterus.  Neck: Normal range of motion. Neck supple. No thyromegaly present.  No bruits thyromegaly or anterior cervical adenopathy  Cardiovascular: Normal rate, regular rhythm, normal heart sounds and intact distal pulses.   No murmur heard. The heart is regular at 72/m  Pulmonary/Chest: Effort normal and breath sounds normal. No respiratory distress. He has no wheezes. He has no rales. He exhibits no tenderness.  Clear anteriorly and posteriorly with no axillary adenopathy  Abdominal: Soft. Bowel sounds are normal. He exhibits mass. There is no tenderness. There is no rebound and no guarding.  No liver or spleen enlargement and no abdominal tenderness. With standing there is an obvious bulge in the left lower quadrant which appears to be an inguinal hernia. This is the area he is concerned about which bothers him at times.  Genitourinary:  Genitourinary Comments: External genitalia within normal limits  Musculoskeletal: Normal range of motion. He exhibits no edema.  Lymphadenopathy:    He has no cervical adenopathy.  Neurological: He is alert and oriented to person, place, and time. He has normal reflexes. No cranial nerve deficit.  Skin: Skin is warm and dry. No rash noted.  Psychiatric: He has a normal mood and affect. His behavior is normal. Judgment and thought content normal.  Nursing note and vitals reviewed.  BP 130/68 (BP Location: Right Arm)   Pulse 84   Temp 97.3 F (36.3 C) (Oral)   Ht 5' 10.5" (1.791 m)   Wt 133 lb (60.3 kg)   BMI 18.81 kg/m         Assessment & Plan:  1. Pure hypercholesterolemia -All cholesterol numbers were excellent and patient will continue with current treatment  2. Essential hypertension -The blood pressure is good and he will continue with current treatment  3. Vitamin D deficiency -Continue with current treatment  4. Thoracic aortic atherosclerosis  ([place]) -Continue with aggressive therapeutic lifestyle changes and current statin treatment  5. Prostate cancer ([place]) -No problems with voiding.  6. Left inguinal hernia -Referred to surgeon for further evaluation and patient was informed if he develops severe pain should get to the emergency room for immediate treatment  Patient Instructions                       Medicare Annual Wellness Visit  [facility] and the medical providers at [facility] strive to bring you the best medical care.  In doing so we not only want to address your current medical conditions and concerns but also to detect new conditions early and prevent illness, disease and health-related problems.    Medicare offers a yearly Wellness Visit which allows our clinical staff to assess your need for preventative services including immunizations, lifestyle education, counseling to decrease risk of preventable diseases and screening for fall risk and other medical concerns.    This visit is provided free of charge (no copay) for all Medicare recipients. The clinical pharmacists at [facility] have begun to conduct  these Wellness Visits which will also include a thorough review of all your medications.    As you primary medical provider recommend that you make an appointment for your Annual Wellness Visit if you have not done so already this year.  You may set up this appointment before you leave today or you may call back [phone]) and schedule an appointment.  Please make sure when you call that you mention that you are scheduling your Annual Wellness Visit with the clinical pharmacist so that the appointment may be made for the proper length of time.     Continue current medications. Continue good therapeutic lifestyle changes which include good diet and exercise. Fall precautions discussed with patient. If an FOBT was given today- please return it to our front desk. If you are  over 56 years old - you may need Prevnar 39 or the adult Pneumonia vaccine.  **Flu shots are available--- please call and schedule a FLU-CLINIC appointment**  After your visit with [place] today you will receive a survey in the mail or online from [employer] regarding your care with [place]. Please take a moment to fill this out. Your feedback is very important to [place] as you can help [place] better understand your patient needs as well as improve your experience and satisfaction. WE CARE ABOUT YOU!!!     We will arrange for the patient have an appointment with a surgeon in [facility] cause of this left inguinal hernia If the patient develops any severe pain in this area he should go to the emergency room immediately  Arrie Senate MD

## 2016-07-28 ENCOUNTER — Telehealth: Payer: Self-pay | Admitting: Family Medicine

## 2016-07-28 NOTE — Telephone Encounter (Signed)
Patient talked to [facility]

## 2016-08-15 ENCOUNTER — Other Ambulatory Visit: Payer: Self-pay | Admitting: General Surgery

## 2016-08-15 DIAGNOSIS — K4090 Unilateral inguinal hernia, without obstruction or gangrene, not specified as recurrent: Secondary | ICD-10-CM | POA: Diagnosis not present

## 2016-08-19 ENCOUNTER — Other Ambulatory Visit: Payer: Self-pay | Admitting: Family Medicine

## 2016-08-23 ENCOUNTER — Other Ambulatory Visit: Payer: Self-pay | Admitting: Family Medicine

## 2016-08-27 ENCOUNTER — Other Ambulatory Visit: Payer: Self-pay | Admitting: Family Medicine

## 2016-09-05 ENCOUNTER — Encounter (HOSPITAL_BASED_OUTPATIENT_CLINIC_OR_DEPARTMENT_OTHER): Payer: Self-pay | Admitting: *Deleted

## 2016-09-05 ENCOUNTER — Encounter: Payer: Self-pay | Admitting: Internal Medicine

## 2016-09-05 ENCOUNTER — Encounter (HOSPITAL_BASED_OUTPATIENT_CLINIC_OR_DEPARTMENT_OTHER)
Admission: RE | Admit: 2016-09-05 | Discharge: 2016-09-05 | Disposition: A | Discharge status: Home / Self Care | Payer: Medicare HMO | Source: Ambulatory Visit | Attending: General Surgery | Admitting: General Surgery

## 2016-09-05 ENCOUNTER — Telehealth: Payer: Self-pay | Admitting: Internal Medicine

## 2016-09-05 ENCOUNTER — Ambulatory Visit (INDEPENDENT_AMBULATORY_CARE_PROVIDER_SITE_OTHER): Payer: Medicare HMO | Admitting: Internal Medicine

## 2016-09-05 VITALS — BP 128/76 | HR 72 | Ht 70.5 in | Wt 136.0 lb

## 2016-09-05 DIAGNOSIS — E784 Other hyperlipidemia: Secondary | ICD-10-CM

## 2016-09-05 DIAGNOSIS — I440 Atrioventricular block, first degree: Secondary | ICD-10-CM | POA: Diagnosis not present

## 2016-09-05 DIAGNOSIS — I10 Essential (primary) hypertension: Secondary | ICD-10-CM | POA: Insufficient documentation

## 2016-09-05 DIAGNOSIS — K4090 Unilateral inguinal hernia, without obstruction or gangrene, not specified as recurrent: Secondary | ICD-10-CM | POA: Diagnosis not present

## 2016-09-05 DIAGNOSIS — E785 Hyperlipidemia, unspecified: Secondary | ICD-10-CM | POA: Insufficient documentation

## 2016-09-05 DIAGNOSIS — I447 Left bundle-branch block, unspecified: Secondary | ICD-10-CM

## 2016-09-05 DIAGNOSIS — E7849 Other hyperlipidemia: Secondary | ICD-10-CM

## 2016-09-05 DIAGNOSIS — Z01810 Encounter for preprocedural cardiovascular examination: Secondary | ICD-10-CM | POA: Diagnosis not present

## 2016-09-05 LAB — BASIC METABOLIC PANEL
ANION GAP: 7 (ref 5–15)
BUN: 39 mg/dL — ABNORMAL HIGH (ref 6–20)
CHLORIDE: 106 mmol/L (ref 101–111)
CO2: 26 mmol/L (ref 22–32)
Calcium: 9.7 mg/dL (ref 8.9–10.3)
Creatinine, Ser: 1.34 mg/dL — ABNORMAL HIGH (ref 0.61–1.24)
GFR calc non Af Amer: 46 mL/min — ABNORMAL LOW (ref >60)
GFR, EST AFRICAN AMERICAN: 54 mL/min — AB (ref >60)
GLUCOSE: 101 mg/dL — AB (ref 65–99)
POTASSIUM: 3.8 mmol/L (ref 3.5–5.1)
Sodium: 139 mmol/L (ref 135–145)

## 2016-09-05 NOTE — Progress Notes (Signed)
EKG reviewed by Dr. Conrad [place], pt needs cardiac clearance.  Notified Dr. Debara Pickett at [facility].  Notified pt of appointment at Dr. Lysbeth Penner office at 230pm. Faxed EKG to office. Notified Brenda at [employer].    Ensure Presur[facility] drink was given with instructions to complete by [place].  Pt verbalized understanding.

## 2016-09-05 NOTE — Patient Instructions (Addendum)
Your physician has requested that you have a lexiscan myoview. For further information please visit [email or web address]. Please follow instruction sheet, as given.  Your physician recommends that you schedule a follow-up appointment with Dr. Debara Pickett after your surgery. We will call you with the test results and notify your surgeon of the results as well.

## 2016-09-05 NOTE — Telephone Encounter (Signed)
Received incoming records from [facility] for upcoming appointment on 09/11/16 @ 2:45pm with Dr. Debara Pickett. Records given to [facility] in Medical Records. 09/05/16 ab

## 2016-09-05 NOTE — Progress Notes (Signed)
OFFICE FOLLOW-UP NOTE  Chief Complaint:  Preoperative risk assessment, new LBBB  Primary Care Physician: Chipper Herb, MD  HPI:  Benjamin Buchanan is a 83 y.o. male, previously seen by Dr. Aundra Buchanan in 2016, with a past medial history significant for hypertension, dyslipidemia and PVCs. He has no known coronary disease. He's recently diagnosed with a left inguinal hernia and scheduled for inguinal hernia repair with mesh by Dr. Donne Hazel on Monday. He underwent preoperative anesthesia evaluation today was noted to have a left bundle branch block on EKG. This is a new finding compared to his prior EKG in 2016. Based on this he was referred for evaluation today. [name] denies any chest pain, worsening shortness of breath or associated symptoms. He says he walks regularly and recently started increasing his walking which is improved his shortness of breath. Unfortunately his wife died fairly recently and prior to that he was less active because he was caring for her. He also put off a lot of his own medical problems in order to help her.  PMHx:  Past Medical History:  Diagnosis Date  . Anemia   . History of prostate cancer 1994  . HLD (hyperlipidemia)   . HTN (hypertension)   . PVC (premature ventricular contraction)    history of    Past Surgical History:  Procedure Laterality Date  . PROSTATE SURGERY      FAMHx:  Family History  Problem Relation Age of Onset  . Cancer Father        prostate cancer  . Hypertension Unknown        family history    SOCHx:   reports that he has never smoked. He has never used smokeless tobacco. He reports that he does not drink alcohol or use drugs.  ALLERGIES:  Allergies  Allergen Reactions  . Antara [Fenofibrate] Swelling  . Lipitor [Atorvastatin] Other (See Comments)    myalgia    ROS: Pertinent items noted in HPI and remainder of comprehensive ROS otherwise negative.  HOME MEDS: Current Outpatient Prescriptions on File Prior to  Visit  Medication Sig Dispense Refill  . amLODipine (NORVASC) 5 MG tablet TAKE 1 TABLET BY MOUTH EVERY DAY 30 tablet 11  . aspirin 81 MG tablet Take 81 mg by mouth. Monday, Wednesday, Friday    . benazepril (LOTENSIN) 20 MG tablet TAKE 1 TABLET EVERY DAY 30 tablet 11  . Cholecalciferol 1000 UNITS capsule Take 1,000 Units by mouth daily.    Marland Kitchen FeFum-FePoly-FA-B Cmp-C-Biot (INTEGRA PLUS) CAPS TAKE 1 CAPSULE BY MOUTH EVERY DAY 30 capsule 1  . fenofibrate 54 MG tablet TAKE 1 TABLET BY MOUTH EVERY DAY 90 tablet 1  . fish oil-omega-3 fatty acids 1000 MG capsule Take 1 g by mouth daily.    . hydrochlorothiazide (HYDRODIURIL) 25 MG tablet TAKE 1 TABLET BY MOUTH EVERY DAY 90 tablet 1  . rosuvastatin (CRESTOR) 5 MG tablet TAKE 1 TABLET EVERY EVENING 30 tablet 3  . vitamin C (ASCORBIC ACID) 500 MG tablet Take 500 mg by mouth daily.     No current facility-administered medications on file prior to visit.     LABS/IMAGING: No results found for this or any previous visit (from the past 48 hour(s)). No results found.  LIPID PANEL:    Component Value Date/Time   CHOL 125 07/08/2016 1133   TRIG 74 07/08/2016 1133   TRIG 93 02/15/2015 0828   HDL 64 07/08/2016 1133   HDL 55 02/15/2015 0828   CHOLHDL 2.0  07/08/2016 1133   LDLCALC 46 07/08/2016 1133   LDLCALC 90 07/18/2013 0806     WEIGHTS: Wt Readings from Last 3 Encounters:  09/05/16 136 lb (61.7 kg)  07/10/16 133 lb (60.3 kg)  02/22/16 138 lb (62.6 kg)    VITALS: BP 128/76   Pulse 72   Ht 5' 10.5" (1.791 m)   Wt 136 lb (61.7 kg)   BMI 19.24 kg/m   EXAM: General appearance: alert and no distress Neck: no carotid bruit, no JVD and thyroid not enlarged, symmetric, no tenderness/mass/nodules Lungs: clear to auscultation bilaterally Heart: regular rate and rhythm Abdomen: soft, non-tender; bowel sounds normal; no masses,  no organomegaly Extremities: extremities normal, atraumatic, no cyanosis or edema Pulses: 2+ and symmetric Skin:  Skin color, texture, turgor normal. No rashes or lesions Neurologic: Grossly normal Psych: Pleasant  EKG: Sinus rhythm with first-degree AV block at 70, LBBB- personally reviewed  ASSESSMENT: 1. Newly identified LBBB 2. Indeterminate preoperative risk for hernia repair 3. Hypertension 4. Dyslipidemia  PLAN: 1.   Mr. Agrusa has a newly identified LBBB, but seems to be asymptomatic from a cardiac standpoint. Nevertheless this is an elective surgery and I would recommend an ischemia evaluation prior to proceeding. Unfortunately, surgery scheduled for next Monday and we were notified of his need to be seen today (Friday) and were able to accommodate him on the same day. We will not be able to do stress testing until next week. His surgery will need to be rescheduled. We will try to schedule it as soon as possible next week and notify Dr. Donne Hazel of the results and clearance as soon as the testing is completed.  Thanks for the kind referral.  Pixie Casino, MD, [facility]  Attending Cardiologist  Direct Dial: [phone]  Fax: [phone]  Website:  [email or web address] 09/05/2016, 5:32 PM

## 2016-09-08 ENCOUNTER — Encounter (HOSPITAL_BASED_OUTPATIENT_CLINIC_OR_DEPARTMENT_OTHER): Payer: Self-pay | Admitting: Certified Registered"

## 2016-09-08 ENCOUNTER — Ambulatory Visit (HOSPITAL_BASED_OUTPATIENT_CLINIC_OR_DEPARTMENT_OTHER): Admission: RE | Admit: 2016-09-08 | Payer: Medicare HMO | Source: Ambulatory Visit | Admitting: General Surgery

## 2016-09-08 HISTORY — DX: Anemia, unspecified: D64.9

## 2016-09-08 SURGERY — REPAIR, HERNIA, INGUINAL, ADULT
Anesthesia: General | Laterality: Left

## 2016-09-08 NOTE — Progress Notes (Signed)
Notified [facility] of Dr. Lysbeth Penner note, spoke with Abigail Butts and left message for [facility].

## 2016-09-09 ENCOUNTER — Ambulatory Visit (HOSPITAL_COMMUNITY)
Admission: RE | Admit: 2016-09-09 | Discharge: 2016-09-09 | Disposition: A | Discharge status: Home / Self Care | Payer: Medicare HMO | Source: Ambulatory Visit | Attending: Cardiovascular Disease | Admitting: Cardiovascular Disease

## 2016-09-09 DIAGNOSIS — Z01810 Encounter for preprocedural cardiovascular examination: Secondary | ICD-10-CM | POA: Insufficient documentation

## 2016-09-09 DIAGNOSIS — I447 Left bundle-branch block, unspecified: Secondary | ICD-10-CM | POA: Diagnosis not present

## 2016-09-09 DIAGNOSIS — Z8546 Personal history of malignant neoplasm of prostate: Secondary | ICD-10-CM | POA: Insufficient documentation

## 2016-09-09 DIAGNOSIS — I10 Essential (primary) hypertension: Secondary | ICD-10-CM | POA: Insufficient documentation

## 2016-09-09 DIAGNOSIS — R9439 Abnormal result of other cardiovascular function study: Secondary | ICD-10-CM | POA: Insufficient documentation

## 2016-09-09 DIAGNOSIS — I700 Atherosclerosis of aorta: Secondary | ICD-10-CM | POA: Insufficient documentation

## 2016-09-09 DIAGNOSIS — I252 Old myocardial infarction: Secondary | ICD-10-CM | POA: Diagnosis not present

## 2016-09-09 LAB — MYOCARDIAL PERFUSION IMAGING
CHL CUP NUCLEAR SRS: 9
CSEPPHR: 90 {beats}/min
LVDIAVOL: 129 mL (ref 62–150)
LVSYSVOL: 61 mL
Rest HR: 62 {beats}/min
SDS: 0
SSS: 9
TID: 1.03

## 2016-09-09 MED ORDER — TECHNETIUM TC 99M TETROFOSMIN IV KIT
31.9000 | PACK | Freq: Once | INTRAVENOUS | Status: AC | PRN
  Administered 2016-09-09: 31.9 via INTRAVENOUS
  Filled 2016-09-09: qty 32

## 2016-09-09 MED ORDER — REGADENOSON 0.4 MG/5ML IV SOLN
0.4000 mg | Freq: Once | INTRAVENOUS | Status: AC
  Administered 2016-09-09: 0.4 mg via INTRAVENOUS

## 2016-09-09 MED ORDER — TECHNETIUM TC 99M TETROFOSMIN IV KIT
11.0000 | PACK | Freq: Once | INTRAVENOUS | Status: AC | PRN
  Administered 2016-09-09: 11 via INTRAVENOUS
  Filled 2016-09-09: qty 11

## 2016-09-11 ENCOUNTER — Ambulatory Visit: Payer: Self-pay | Admitting: Internal Medicine

## 2016-10-16 ENCOUNTER — Other Ambulatory Visit: Payer: Self-pay | Admitting: Family Medicine

## 2016-10-16 ENCOUNTER — Encounter (HOSPITAL_BASED_OUTPATIENT_CLINIC_OR_DEPARTMENT_OTHER): Payer: Self-pay | Admitting: *Deleted

## 2016-10-16 NOTE — Progress Notes (Signed)
EKG results reviewed/approved by Dr. Lyndle Herrlich and is ok for surgery.

## 2016-10-20 NOTE — Progress Notes (Signed)
Pt. Has called [place] and office several times in relation to what meds he is to stop and what meds he is to take on morning of surgery.  Not sure if pt is forgetting he has already called to ask this question.  Pt. Was asked to write it down each time.

## 2016-10-23 ENCOUNTER — Ambulatory Visit (HOSPITAL_BASED_OUTPATIENT_CLINIC_OR_DEPARTMENT_OTHER)
Admission: RE | Admit: 2016-10-23 | Discharge: 2016-10-23 | Disposition: A | Discharge status: Home / Self Care | Payer: Medicare HMO | Source: Ambulatory Visit | Attending: General Surgery | Admitting: General Surgery

## 2016-10-23 ENCOUNTER — Encounter (HOSPITAL_BASED_OUTPATIENT_CLINIC_OR_DEPARTMENT_OTHER): Payer: Self-pay | Admitting: Certified Registered"

## 2016-10-23 ENCOUNTER — Encounter (HOSPITAL_BASED_OUTPATIENT_CLINIC_OR_DEPARTMENT_OTHER)
Admission: RE | Disposition: A | Discharge status: Home / Self Care | Payer: Self-pay | Source: Ambulatory Visit | Attending: General Surgery

## 2016-10-23 ENCOUNTER — Ambulatory Visit (HOSPITAL_BASED_OUTPATIENT_CLINIC_OR_DEPARTMENT_OTHER): Payer: Medicare HMO | Admitting: Certified Registered"

## 2016-10-23 DIAGNOSIS — I10 Essential (primary) hypertension: Secondary | ICD-10-CM | POA: Diagnosis not present

## 2016-10-23 DIAGNOSIS — Z8546 Personal history of malignant neoplasm of prostate: Secondary | ICD-10-CM | POA: Insufficient documentation

## 2016-10-23 DIAGNOSIS — Z7982 Long term (current) use of aspirin: Secondary | ICD-10-CM | POA: Diagnosis not present

## 2016-10-23 DIAGNOSIS — K4090 Unilateral inguinal hernia, without obstruction or gangrene, not specified as recurrent: Secondary | ICD-10-CM | POA: Insufficient documentation

## 2016-10-23 DIAGNOSIS — G8918 Other acute postprocedural pain: Secondary | ICD-10-CM | POA: Diagnosis not present

## 2016-10-23 DIAGNOSIS — Z79899 Other long term (current) drug therapy: Secondary | ICD-10-CM | POA: Insufficient documentation

## 2016-10-23 DIAGNOSIS — Z9079 Acquired absence of other genital organ(s): Secondary | ICD-10-CM | POA: Insufficient documentation

## 2016-10-23 DIAGNOSIS — C61 Malignant neoplasm of prostate: Secondary | ICD-10-CM | POA: Diagnosis not present

## 2016-10-23 HISTORY — PX: INGUINAL HERNIA REPAIR: SHX194

## 2016-10-23 HISTORY — PX: INSERTION OF MESH: SHX5868

## 2016-10-23 SURGERY — REPAIR, HERNIA, INGUINAL, ADULT
Anesthesia: General | Site: Groin | Laterality: Left

## 2016-10-23 MED ORDER — MIDAZOLAM HCL 2 MG/2ML IJ SOLN
1.0000 mg | INTRAMUSCULAR | Status: DC | PRN
  Administered 2016-10-23: 1 mg via INTRAVENOUS

## 2016-10-23 MED ORDER — 0.9 % SODIUM CHLORIDE (POUR BTL) OPTIME
TOPICAL | Status: DC | PRN
  Administered 2016-10-23: 1000 mL

## 2016-10-23 MED ORDER — ONDANSETRON HCL 4 MG/2ML IJ SOLN
INTRAMUSCULAR | Status: AC
  Filled 2016-10-23: qty 12

## 2016-10-23 MED ORDER — EPHEDRINE SULFATE 50 MG/ML IJ SOLN
INTRAMUSCULAR | Status: DC | PRN
  Administered 2016-10-23: 10 mg via INTRAVENOUS

## 2016-10-23 MED ORDER — PROMETHAZINE HCL 25 MG/ML IJ SOLN: 6.2500 mg | INTRAMUSCULAR | Status: DC | PRN

## 2016-10-23 MED ORDER — FENTANYL CITRATE (PF) 100 MCG/2ML IJ SOLN
50.0000 ug | INTRAMUSCULAR | Status: DC | PRN
  Administered 2016-10-23: 50 ug via INTRAVENOUS
  Administered 2016-10-23: 25 ug via INTRAVENOUS

## 2016-10-23 MED ORDER — ACETAMINOPHEN 500 MG PO TABS
ORAL_TABLET | ORAL | Status: AC
  Filled 2016-10-23: qty 2

## 2016-10-23 MED ORDER — FENTANYL CITRATE (PF) 100 MCG/2ML IJ SOLN
INTRAMUSCULAR | Status: AC
  Filled 2016-10-23: qty 2

## 2016-10-23 MED ORDER — ACETAMINOPHEN 500 MG PO TABS
1000.0000 mg | ORAL_TABLET | ORAL | Status: AC
  Administered 2016-10-23: 1000 mg via ORAL

## 2016-10-23 MED ORDER — LIDOCAINE HCL (CARDIAC) 20 MG/ML IV SOLN
INTRAVENOUS | Status: DC | PRN
  Administered 2016-10-23: 30 mg via INTRAVENOUS

## 2016-10-23 MED ORDER — ROPIVACAINE HCL 5 MG/ML IJ SOLN
INTRAMUSCULAR | Status: DC | PRN
  Administered 2016-10-23: 30 mL via PERINEURAL

## 2016-10-23 MED ORDER — ONDANSETRON HCL 4 MG/2ML IJ SOLN
INTRAMUSCULAR | Status: AC
  Filled 2016-10-23: qty 2

## 2016-10-23 MED ORDER — DEXAMETHASONE SODIUM PHOSPHATE 4 MG/ML IJ SOLN
INTRAMUSCULAR | Status: DC | PRN
  Administered 2016-10-23: 6 mg via INTRAVENOUS

## 2016-10-23 MED ORDER — CEFAZOLIN SODIUM-DEXTROSE 2-4 GM/100ML-% IV SOLN
2.0000 g | INTRAVENOUS | Status: AC
  Administered 2016-10-23: 2 g via INTRAVENOUS

## 2016-10-23 MED ORDER — OXYCODONE HCL 5 MG PO TABS: 5.0000 mg | ORAL_TABLET | Freq: Four times a day (QID) | ORAL | 0 refills | Status: DC | PRN

## 2016-10-23 MED ORDER — SCOPOLAMINE 1 MG/3DAYS TD PT72: 1.0000 | MEDICATED_PATCH | Freq: Once | TRANSDERMAL | Status: DC | PRN

## 2016-10-23 MED ORDER — BUPIVACAINE HCL (PF) 0.25 % IJ SOLN
INTRAMUSCULAR | Status: AC
Start: 2016-10-23 — End: 2016-10-23
  Filled 2016-10-23: qty 30

## 2016-10-23 MED ORDER — HYDROMORPHONE HCL 1 MG/ML IJ SOLN: 0.2500 mg | INTRAMUSCULAR | Status: DC | PRN

## 2016-10-23 MED ORDER — LACTATED RINGERS IV SOLN
INTRAVENOUS | Status: DC
  Administered 2016-10-23 (×2): via INTRAVENOUS

## 2016-10-23 MED ORDER — GABAPENTIN 300 MG PO CAPS: 300.0000 mg | ORAL_CAPSULE | ORAL | Status: DC

## 2016-10-23 MED ORDER — DEXAMETHASONE SODIUM PHOSPHATE 10 MG/ML IJ SOLN
INTRAMUSCULAR | Status: AC
  Filled 2016-10-23: qty 1

## 2016-10-23 MED ORDER — BUPIVACAINE HCL (PF) 0.25 % IJ SOLN
INTRAMUSCULAR | Status: DC | PRN
  Administered 2016-10-23: 6 mL

## 2016-10-23 MED ORDER — PROPOFOL 500 MG/50ML IV EMUL
INTRAVENOUS | Status: AC
  Filled 2016-10-23: qty 50

## 2016-10-23 MED ORDER — LIDOCAINE 2% (20 MG/ML) 5 ML SYRINGE
INTRAMUSCULAR | Status: AC
  Filled 2016-10-23: qty 5

## 2016-10-23 MED ORDER — CEFAZOLIN SODIUM-DEXTROSE 2-4 GM/100ML-% IV SOLN
INTRAVENOUS | Status: AC
  Filled 2016-10-23: qty 100

## 2016-10-23 MED ORDER — MIDAZOLAM HCL 2 MG/2ML IJ SOLN
INTRAMUSCULAR | Status: AC
Start: 2016-10-23 — End: 2016-10-23
  Filled 2016-10-23: qty 2

## 2016-10-23 MED ORDER — DEXAMETHASONE SODIUM PHOSPHATE 10 MG/ML IJ SOLN
INTRAMUSCULAR | Status: AC
  Filled 2016-10-23: qty 4

## 2016-10-23 MED ORDER — PHENYLEPHRINE HCL 10 MG/ML IJ SOLN
INTRAMUSCULAR | Status: DC | PRN
  Administered 2016-10-23: 40 ug via INTRAVENOUS

## 2016-10-23 MED ORDER — OXYCODONE HCL 5 MG/5ML PO SOLN: 5.0000 mg | Freq: Once | ORAL | Status: DC | PRN

## 2016-10-23 MED ORDER — PROPOFOL 10 MG/ML IV BOLUS
INTRAVENOUS | Status: DC | PRN
  Administered 2016-10-23: 100 mg via INTRAVENOUS

## 2016-10-23 MED ORDER — LIDOCAINE 2% (20 MG/ML) 5 ML SYRINGE
INTRAMUSCULAR | Status: AC
Start: 2016-10-23 — End: 2016-10-23
  Filled 2016-10-23: qty 15

## 2016-10-23 MED ORDER — OXYCODONE HCL 5 MG PO TABS: 5.0000 mg | ORAL_TABLET | Freq: Once | ORAL | Status: DC | PRN

## 2016-10-23 SURGICAL SUPPLY — 40 items
ADH SKN CLS APL DERMABOND .7 (GAUZE/BANDAGES/DRESSINGS) ×1
BLADE CLIPPER SURG (BLADE) ×1 IMPLANT
BLADE SURG 15 STRL LF DISP TIS (BLADE) ×1 IMPLANT
BLADE SURG 15 STRL SS (BLADE) ×2
CHLORAPREP W/TINT 26ML (MISCELLANEOUS) ×2 IMPLANT
COVER BACK TABLE 60X90IN (DRAPES) ×2 IMPLANT
COVER MAYO STAND STRL (DRAPES) ×2 IMPLANT
DERMABOND ADVANCED (GAUZE/BANDAGES/DRESSINGS) ×1
DERMABOND ADVANCED .7 DNX12 (GAUZE/BANDAGES/DRESSINGS) ×1 IMPLANT
DRAIN PENROSE 1/2X12 LTX STRL (WOUND CARE) ×2 IMPLANT
DRAPE LAPAROTOMY TRNSV 102X78 (DRAPE) ×2 IMPLANT
DRAPE UTILITY XL STRL (DRAPES) ×2 IMPLANT
ELECT COATED BLADE 2.86 ST (ELECTRODE) ×2 IMPLANT
ELECT REM PT RETURN 9FT ADLT (ELECTROSURGICAL) ×2
ELECTRODE REM PT RTRN 9FT ADLT (ELECTROSURGICAL) ×1 IMPLANT
GLOVE BIO SURGEON STRL SZ7 (GLOVE) ×2 IMPLANT
GLOVE BIOGEL PI IND STRL 7.5 (GLOVE) ×1 IMPLANT
GLOVE BIOGEL PI INDICATOR 7.5 (GLOVE) ×1
GOWN STRL REUS W/ TWL LRG LVL3 (GOWN DISPOSABLE) ×2 IMPLANT
GOWN STRL REUS W/TWL LRG LVL3 (GOWN DISPOSABLE) ×4
MESH ULTRAPRO 3X6 7.6X15CM (Mesh General) ×1 IMPLANT
NEEDLE HYPO 22GX1.5 SAFETY (NEEDLE) ×2 IMPLANT
NS IRRIG 1000ML POUR BTL (IV SOLUTION) ×1 IMPLANT
PACK BASIN DAY SURGERY FS (CUSTOM PROCEDURE TRAY) ×2 IMPLANT
PENCIL BUTTON HOLSTER BLD 10FT (ELECTRODE) ×2 IMPLANT
SLEEVE SCD COMPRESS KNEE MED (MISCELLANEOUS) ×1 IMPLANT
SPONGE LAP 4X18 X RAY DECT (DISPOSABLE) ×2 IMPLANT
STRIP CLOSURE SKIN 1/2X4 (GAUZE/BANDAGES/DRESSINGS) ×1 IMPLANT
SUT MNCRL AB 4-0 PS2 18 (SUTURE) ×2 IMPLANT
SUT SILK 2 0 SH (SUTURE) IMPLANT
SUT VIC AB 0 SH 27 (SUTURE) IMPLANT
SUT VIC AB 2-0 SH 18 (SUTURE) ×2 IMPLANT
SUT VIC AB 2-0 SH 27 (SUTURE)
SUT VIC AB 2-0 SH 27XBRD (SUTURE) IMPLANT
SUT VIC AB 3-0 SH 27 (SUTURE) ×2
SUT VIC AB 3-0 SH 27X BRD (SUTURE) ×1 IMPLANT
SUT VICRYL AB 3 0 TIES (SUTURE) ×1 IMPLANT
SYR CONTROL 10ML LL (SYRINGE) ×2 IMPLANT
TOWEL OR 17X24 6PK STRL BLUE (TOWEL DISPOSABLE) ×2 IMPLANT
TOWEL OR NON WOVEN STRL DISP B (DISPOSABLE) ×2 IMPLANT

## 2016-10-23 NOTE — Anesthesia Preprocedure Evaluation (Signed)
Anesthesia Evaluation  Patient identified by MRN, date of birth, ID band Patient awake    Reviewed: Allergy & Precautions, NPO status , Patient's Chart, lab work & pertinent test results  Airway Mallampati: II  TM Distance: >3 FB Neck ROM: Full    Dental no notable dental hx.    Pulmonary neg pulmonary ROS,    Pulmonary exam normal breath sounds clear to auscultation       Cardiovascular hypertension, Pt. on medications + Past MI  negative cardio ROS Normal cardiovascular exam Rhythm:Regular Rate:Normal     Neuro/Psych negative neurological ROS  negative psych ROS   GI/Hepatic negative GI ROS, Neg liver ROS,   Endo/Other  negative endocrine ROS  Renal/GU negative Renal ROS  negative genitourinary   Musculoskeletal negative musculoskeletal ROS (+)   Abdominal   Peds negative pediatric ROS (+)  Hematology negative hematology ROS (+)   Anesthesia Other Findings   Reproductive/Obstetrics negative OB ROS                             Anesthesia Physical Anesthesia Plan  ASA: III  Anesthesia Plan: General   Post-op Pain Management:    Induction: Intravenous  PONV Risk Score and Plan: 2 and Ondansetron and Midazolam  Airway Management Planned: LMA  Additional Equipment:   Intra-op Plan:   Post-operative Plan: Extubation in OR  Informed Consent: I have reviewed the patients History and Physical, chart, labs and discussed the procedure including the risks, benefits and alternatives for the proposed anesthesia with the patient or authorized representative who has indicated his/her understanding and acceptance.   Dental advisory given  Plan Discussed with: CRNA  Anesthesia Plan Comments:         Anesthesia Quick Evaluation

## 2016-10-23 NOTE — Interval H&P Note (Signed)
History and Physical Interval Note:  10/23/2016 7:15 AM  Benjamin Buchanan  has presented today for surgery, with the diagnosis of LEFT INGUINAL HERNIA REPAIR WITH MESH   The various methods of treatment have been discussed with the patient and family. After consideration of risks, benefits and other options for treatment, the patient has consented to  Procedure(s): LEFT INGUINAL HERNIA REPAIR WITH MESH   (Left) INSERTION OF MESH (Left) as a surgical intervention .  The patient's history has been reviewed, patient examined, no change in status, stable for surgery.  I have reviewed the patient's chart and labs.  Questions were answered to the patient's satisfaction.     Parker Wherley

## 2016-10-23 NOTE — Op Note (Signed)
Preoperative diagnosis: Left inguinal hernia Postoperative diagnosis: Indirect left inguinal hernia Procedure: Left inguinal hernia repair with UltraPro mesh patch Surgeon: Dr. Serita Grammes Anesthesia: Gen. with tap block Estimated blood loss: Minimal Drains: None Complications: None Specimens: None Disposition to recovery in stable condition Sponge and needle count correct at completion  Indications: This is an 83 year old male with a symptomatic left groin hernia. He's had a prior prostatectomy. I discussed options and we have elected to proceed with an open inguinal hernia repair with mesh.  Procedure: After informed consent was obtained the patient was taken to the operating room. He underwent a tap block. He was given antibiotics. SCDs were in place. He was then placed under general anesthesia without complication. His left groin was prepped and draped in the standard sterile surgical fashion. A surgical timeout was then performed.  I infiltrated Marcaine in the skin in the left groin. I then made an incision. I ligated the superficial epigastric vein with 3-0 Vicryl sutures. I then carried this through the external oblique. He did not have a direct defect. He certainly had some scarring from his prior surgery. He was noted to have a fairly large indirect hernia. I was able to reduce the indirect hernia sac completely. His internal ring was patulous. I closed this with 2-0 Vicryl suture. I then used an ultraPro mesh patch and laid this on the floor. I sutured this into position at the pubic tubercle with 2-0 Vicryl suture. I secured this every half centimeter to the inguinal ligament inferiorly with 2-0 Vicryl suture. I then made a T cut and wrapped this around the spermatic cord. I sutured this to the floor superiorly. This completely obliterated the defect. Hemostasis was observed. I then closed the external oblique with 2-0 Vicryl suture. Scarpa's fascia was closed with 3-0 Vicryl suture.  The skin was closed with 4-0 Monocryl and glue was placed. He tolerated this well was extubated and transferred to the recovery room in stable condition.

## 2016-10-23 NOTE — Anesthesia Postprocedure Evaluation (Signed)
Anesthesia Post Note  Patient: Benjamin Buchanan  Procedure(s) Performed: LEFT INGUINAL HERNIA REPAIR WITH MESH   (Left Groin) INSERTION OF MESH (Left Groin)     Patient location during evaluation: PACU Anesthesia Type: General Level of consciousness: awake and alert Pain management: pain level controlled Vital Signs Assessment: post-procedure vital signs reviewed and stable Respiratory status: spontaneous breathing, nonlabored ventilation and respiratory function stable Cardiovascular status: blood pressure returned to baseline and stable Postop Assessment: no apparent nausea or vomiting Anesthetic complications: no    Last Vitals:  Vitals:   10/23/16 0920 10/23/16 0934  BP: (!) 163/68 (!) 153/67  Pulse:  79  Resp:  16  Temp:    SpO2:  97%    Last Pain:  Vitals:   10/23/16 0934  TempSrc:   PainSc: [place]

## 2016-10-23 NOTE — H&P (Signed)
40 yof referred by Dr Morrie Sheldon for left groin hernia. he is very healthy and active. his wife has recently passed away and he noted a left groin mass while lifting her. this area is out when standing and goes away when lying down. he has not issues with bms. he very much would like to get repaired as he wants to mow his lawn and perform other activities this is preventing him from doing.   Past Surgical History  TURP   Diagnostic Studies History  Colonoscopy  1-5 years ago  Allergies  No Known Allergies   Medication History AmLODIPine Besylate (5MG  Tablet, Oral) Active. Fenofibrate (54MG  Tablet, Oral) Active. Integra Plus (Oral) Active. Rosuvastatin Calcium (5MG  Tablet, Oral) Active. HydroCHLOROthiazide (25MG  Tablet, Oral) Active. Benazepril HCl (20MG  Tablet, Oral) Active. Aspirin (81MG  Tablet, Oral) Active. Vitamin D (1000UNIT Tablet, Oral) Active. Fish Oil Omega-3 (Oral) Specific strength unknown - Active. Medications Reconciled  Social History  No alcohol use  No caffeine use  No drug use  Tobacco use  Never smoker.  Family History Family history unknown  First Degree Relatives   Other Problems  High blood pressure  Prostate Cancer   Review of Systems  General Not Present- Appetite Loss, Chills, Fatigue, Fever, Night Sweats, Weight Gain and Weight Loss. Skin Not Present- Change in Wart/Mole, Dryness, Hives, Jaundice, New Lesions, Non-Healing Wounds, Rash and Ulcer. HEENT Not Present- Earache, Hearing Loss, Hoarseness, Nose Bleed, Oral Ulcers, Ringing in the Ears, Seasonal Allergies, Sinus Pain, Sore Throat, Visual Disturbances, Wears glasses/contact lenses and Yellow Eyes. Respiratory Not Present- Bloody sputum, Chronic Cough, Difficulty Breathing, Snoring and Wheezing. Breast Not Present- Breast Mass, Breast Pain, Nipple Discharge and Skin Changes. Cardiovascular Not Present- Chest Pain, Difficulty Breathing Lying Down, Leg Cramps,  Palpitations, Rapid Heart Rate, Shortness of Breath and Swelling of Extremities. Gastrointestinal Not Present- Abdominal Pain, Bloating, Bloody Stool, Change in Bowel Habits, Chronic diarrhea, Constipation, Difficulty Swallowing, Excessive gas, Gets full quickly at meals, Hemorrhoids, Indigestion, Nausea, Rectal Pain and Vomiting. Male Genitourinary Not Present- Blood in Urine, Change in Urinary Stream, Frequency, Impotence, Nocturia, Painful Urination, Urgency and Urine Leakage.  Vitals Weight: 137.2 lb Height: 71in Body Surface Area: 1.8 m Body Mass Index: 19.14 kg/m  Temp.: 98.56F  Pulse: 74 (Regular)  BP: 150/70 (Sitting, Left Arm, Standard) Physical Exam  General Mental Status-Alert. Orientation-Oriented X3. Eye Sclera/Conjunctiva - Bilateral-No scleral icterus. Chest and Lung Exam Chest and lung exam reveals -quiet, even and easy respiratory effort with no use of accessory muscles and on auscultation, normal breath sounds, no adventitious sounds and normal vocal resonance. Cardiovascular Cardiovascular examination reveals -normal heart sounds, regular rate and rhythm with no murmurs and no digital clubbing, cyanosis, edema, increased warmth or tenderness. Abdomen Note: soft nt/nd low midline well healed   Assessment & Plan  LEFT INGUINAL HERNIA (K40.90) Story: Left inguinal hernia repair with mesh We discussed observation versus repair. He is having some symptoms from this now and preventing him from doing some of his activities. We discussed both laparoscopic and open inguinal hernia repairs. I described the procedure in detail. I think open repair easiest given open prostatectomy. Goals should be achieved with surgery. We discussed the usage of mesh and the rationale behind that. We went over the pathophysiology of inguinal hernia. We have elected to perform open inguinal hernia repair with mesh. We discussed the risks including bleeding, infection,  recurrence, postoperative pain and chronic groin pain, testicular injury, urinary retention, numbness in groin and around incision.

## 2016-10-23 NOTE — Progress Notes (Signed)
Assisted Dr. Sabra Heck with left, ultrasound guided, transabdominal plane block. Side rails up, monitors on throughout procedure. See vital signs in flow sheet. Tolerated Procedure well.

## 2016-10-23 NOTE — Anesthesia Procedure Notes (Signed)
Procedure Name: LMA Insertion Date/Time: 10/23/2016 7:41 AM Performed by: Jovannie Ulibarri D Pre-anesthesia Checklist: Patient identified, Emergency Drugs available, Suction available and Patient being monitored Patient Re-evaluated:Patient Re-evaluated prior to induction Oxygen Delivery Method: Circle system utilized Preoxygenation: Pre-oxygenation with 100% oxygen Induction Type: IV induction Ventilation: Mask ventilation without difficulty LMA: LMA inserted LMA Size: 4.0 Number of attempts: 1 Airway Equipment and Method: Bite block Placement Confirmation: positive ETCO2 Tube secured with: Tape Dental Injury: Teeth and Oropharynx as per pre-operative assessment

## 2016-10-23 NOTE — Discharge Instructions (Signed)
[facility] ° °UMBILICAL OR INGUINAL HERNIA REPAIR: POST OP INSTRUCTIONS ° °Always review your discharge instruction sheet given to you by the facility where your surgery was performed. °IF YOU HAVE DISABILITY OR FAMILY LEAVE FORMS, YOU MUST BRING THEM TO THE OFFICE FOR PROCESSING.   °DO NOT GIVE THEM TO YOUR DOCTOR. ° °1. A  prescription for pain medication may be given to you upon discharge.  Take your pain medication as prescribed, if needed.  If narcotic pain medicine is not needed, then you may take acetaminophen (Tylenol), naprosyn (Alleve) or ibuprofen (Advil) as needed. °2. Take your usually prescribed medications unless otherwise directed. °3. If you need a refill on your pain medication, please contact your pharmacy.  They will contact our office to request authorization. Prescriptions will not be filled after 5 pm or on week-ends. °4. You should follow a light diet the first 24 hours after arrival home, such as soup and crackers, etc.  Be sure to include lots of fluids daily.  Resume your normal diet the day after surgery. °5. Most patients will experience some swelling and bruising around the umbilicus or in the groin and scrotum.  Ice packs and reclining will help.  Swelling and bruising can take several days to resolve.  °6. It is common to experience some constipation if taking pain medication after surgery.  Increasing fluid intake and taking a stool softener (such as Colace) will usually help or prevent this problem from occurring.  A mild laxative (Milk of Magnesia or Miralax) should be taken according to package directions if there are no bowel movements after 48 hours. °7. Unless discharge instructions indicate otherwise, you may remove your bandages 48 hours after surgery, and you may shower at that time.  You may have steri-strips (small skin tapes) in place directly over the incision.  These strips should be left on the skin for 7-10 days and will come off on their own.   If your surgeon used skin glue on the incision, you may shower in 24 hours.  The glue will flake off over the next 2-3 weeks.  Any sutures or staples will be removed at the office during your follow-up visit. °8. ACTIVITIES:  You may resume regular (light) daily activities beginning the next day--such as daily self-care, walking, climbing stairs--gradually increasing activities as tolerated.  You may have sexual intercourse when it is comfortable.  Refrain from any heavy lifting or straining until approved by your doctor. °a. You may drive when you are no longer taking prescription pain medication, you can comfortably wear a seatbelt, and you can safely maneuver your car and apply brakes. °b. RETURN TO WORK:  __________________________________________________________ °9. You should see your doctor in the office for a follow-up appointment approximately 2-3 weeks after your surgery.  Make sure that you call for this appointment within a day or two after you arrive home to insure a convenient appointment time. °10. OTHER INSTRUCTIONS:  __________________________________________________________________________________________________________________________________________________________________________________________  °WHEN TO CALL YOUR DOCTOR: °1. Fever over 101.0 °2. Inability to urinate °3. Nausea and/or vomiting °4. Extreme swelling or bruising °5. Continued bleeding from incision. °6. Increased pain, redness, or drainage from the incision ° °The clinic staff is available to answer your questions during regular business hours.  Please don’t hesitate to call and ask to speak to one of the nurses for clinical concerns.  If you have a medical emergency, go to the nearest emergency room or call 911.  A surgeon from [facility]   is always on call at the hospital   [address], [place], [place], [place]  [address] ?  [address], [place], [place]   [address] [phone] ? [phone] ? FAX  [phone] Web site: [email or web address]   Post Anesthesia Home Care Instructions  Activity: Get plenty of rest for the remainder of the day. A responsible individual must stay with you for 24 hours following the procedure.  For the next 24 hours, DO NOT: -Drive a car -Paediatric nurse -Drink alcoholic beverages -Take any medication unless instructed by your physician -Make any legal decisions or sign important papers.  Meals: Start with liquid foods such as gelatin or soup. Progress to regular foods as tolerated. Avoid greasy, spicy, heavy foods. If nausea and/or vomiting occur, drink only clear liquids until the nausea and/or vomiting subsides. Call your physician if vomiting continues.  Special Instructions/Symptoms: Your throat may feel dry or sore from the anesthesia or the breathing tube placed in your throat during surgery. If this causes discomfort, gargle with warm salt water. The discomfort should disappear within 24 hours.  If you had a scopolamine patch placed behind your ear for the management of post- operative nausea and/or vomiting:  1. The medication in the patch is effective for 72 hours, after which it should be removed.  Wrap patch in a tissue and discard in the trash. Wash hands thoroughly with soap and water. 2. You may remove the patch earlier than 72 hours if you experience unpleasant side effects which may include dry mouth, dizziness or visual disturbances. 3. Avoid touching the patch. Wash your hands with soap and water after contact with the patch.

## 2016-10-23 NOTE — Anesthesia Procedure Notes (Signed)
Anesthesia Regional Block: TAP block   Pre-Anesthetic Checklist: ,, timeout performed, Correct Patient, Correct Site, Correct Laterality, Correct Procedure, Correct Position, site marked, Risks and benefits discussed,  Surgical consent,  Pre-op evaluation,  At surgeon's request and post-op pain management  Laterality: Left  Prep: chloraprep       Needles:  Injection technique: Single-shot  Needle Type: Stimiplex     Needle Length: 9cm  Needle Gauge: 21     Additional Needles:   Procedures:,,,, ultrasound used (permanent image in chart),,,,  Narrative:  Start time: 10/23/2016 7:26 AM End time: 10/23/2016 7:31 AM Injection made incrementally with aspirations every 5 mL.  Performed by: Personally  Anesthesiologist: Candida Peeling RAY

## 2016-10-23 NOTE — Transfer of Care (Signed)
Immediate Anesthesia Transfer of Care Note  Patient: Benjamin Buchanan  Procedure(s) Performed: LEFT INGUINAL HERNIA REPAIR WITH MESH   (Left Groin) INSERTION OF MESH (Left Groin)  Patient Location: PACU  Anesthesia Type:GA combined with regional for post-op pain  Level of Consciousness: awake and patient cooperative  Airway & Oxygen Therapy: Patient Spontanous Breathing and Patient connected to face mask oxygen  Post-op Assessment: Report given to RN and Post -op Vital signs reviewed and stable  Post vital signs: Reviewed and stable  Last Vitals:  Vitals:   10/23/16 0725 10/23/16 0730  BP: 140/69 (!) 147/68  Pulse: 70 64  Resp: 18 12  Temp:    SpO2: 100% 100%    Last Pain:  Vitals:   10/23/16 0730  TempSrc:   PainSc: 0-No pain         Complications: No apparent anesthesia complications

## 2016-10-24 ENCOUNTER — Encounter (HOSPITAL_BASED_OUTPATIENT_CLINIC_OR_DEPARTMENT_OTHER): Payer: Self-pay | Admitting: General Surgery

## 2016-11-13 ENCOUNTER — Other Ambulatory Visit: Payer: Medicare HMO

## 2016-11-13 DIAGNOSIS — R6889 Other general symptoms and signs: Secondary | ICD-10-CM | POA: Diagnosis not present

## 2016-11-13 DIAGNOSIS — I10 Essential (primary) hypertension: Secondary | ICD-10-CM

## 2016-11-13 DIAGNOSIS — E7800 Pure hypercholesterolemia, unspecified: Secondary | ICD-10-CM | POA: Diagnosis not present

## 2016-11-13 DIAGNOSIS — I700 Atherosclerosis of aorta: Secondary | ICD-10-CM | POA: Diagnosis not present

## 2016-11-13 DIAGNOSIS — C61 Malignant neoplasm of prostate: Secondary | ICD-10-CM

## 2016-11-13 DIAGNOSIS — E559 Vitamin D deficiency, unspecified: Secondary | ICD-10-CM | POA: Diagnosis not present

## 2016-11-14 LAB — BMP8+EGFR
BUN / CREAT RATIO: 17 (ref 10–24)
BUN: 24 mg/dL (ref 8–27)
CALCIUM: 9.4 mg/dL (ref 8.6–10.2)
CHLORIDE: 105 mmol/L (ref 96–106)
CO2: 24 mmol/L (ref 20–29)
Creatinine, Ser: 1.43 mg/dL — ABNORMAL HIGH (ref 0.76–1.27)
GFR, EST AFRICAN AMERICAN: 51 mL/min/{1.73_m2} — AB (ref >59)
GFR, EST NON AFRICAN AMERICAN: 44 mL/min/{1.73_m2} — AB (ref >59)
Glucose: 93 mg/dL (ref 65–99)
POTASSIUM: 4.2 mmol/L (ref 3.5–5.2)
SODIUM: 145 mmol/L — AB (ref 134–144)

## 2016-11-14 LAB — CBC WITH DIFFERENTIAL/PLATELET
BASOS: 0 %
Basophils Absolute: 0 10*3/uL (ref 0.0–0.2)
EOS (ABSOLUTE): 0.2 10*3/uL (ref 0.0–0.4)
EOS: 4 %
HEMATOCRIT: 33.5 % — AB (ref 37.5–51.0)
Hemoglobin: 10.9 g/dL — ABNORMAL LOW (ref 13.0–17.7)
IMMATURE GRANULOCYTES: 0 %
Immature Grans (Abs): 0 10*3/uL (ref 0.0–0.1)
LYMPHS ABS: 1.3 10*3/uL (ref 0.7–3.1)
Lymphs: 23 %
MCH: 30.7 pg (ref 26.6–33.0)
MCHC: 32.5 g/dL (ref 31.5–35.7)
MCV: 94 fL (ref 79–97)
Monocytes Absolute: 0.4 10*3/uL (ref 0.1–0.9)
Monocytes: 8 %
NEUTROS PCT: 65 %
Neutrophils Absolute: 3.6 10*3/uL (ref 1.4–7.0)
PLATELETS: 296 10*3/uL (ref 150–379)
RBC: 3.55 x10E6/uL — ABNORMAL LOW (ref 4.14–5.80)
RDW: 13.2 % (ref 12.3–15.4)
WBC: 5.5 10*3/uL (ref 3.4–10.8)

## 2016-11-14 LAB — LIPID PANEL
CHOL/HDL RATIO: 2.1 ratio (ref 0.0–5.0)
Cholesterol, Total: 131 mg/dL (ref 100–199)
HDL: 61 mg/dL (ref >39)
LDL Calculated: 55 mg/dL (ref 0–99)
Triglycerides: 74 mg/dL (ref 0–149)
VLDL Cholesterol Cal: 15 mg/dL (ref 5–40)

## 2016-11-14 LAB — HEPATIC FUNCTION PANEL
ALBUMIN: 4.1 g/dL (ref 3.5–4.7)
ALT: 14 IU/L (ref 0–44)
AST: 20 IU/L (ref 0–40)
Alkaline Phosphatase: 46 IU/L (ref 39–117)
BILIRUBIN TOTAL: 0.6 mg/dL (ref 0.0–1.2)
BILIRUBIN, DIRECT: 0.19 mg/dL (ref 0.00–0.40)
TOTAL PROTEIN: 6.2 g/dL (ref 6.0–8.5)

## 2016-11-14 LAB — VITAMIN D 25 HYDROXY (VIT D DEFICIENCY, FRACTURES): VIT D 25 HYDROXY: 49.3 ng/mL (ref 30.0–100.0)

## 2016-11-18 ENCOUNTER — Ambulatory Visit (INDEPENDENT_AMBULATORY_CARE_PROVIDER_SITE_OTHER): Payer: Medicare HMO | Admitting: Family Medicine

## 2016-11-18 ENCOUNTER — Encounter: Payer: Self-pay | Admitting: Family Medicine

## 2016-11-18 ENCOUNTER — Other Ambulatory Visit: Payer: Self-pay | Admitting: Family Medicine

## 2016-11-18 VITALS — BP 140/74 | HR 73 | Temp 96.9°F | Ht 71.0 in | Wt 139.0 lb

## 2016-11-18 DIAGNOSIS — E559 Vitamin D deficiency, unspecified: Secondary | ICD-10-CM | POA: Diagnosis not present

## 2016-11-18 DIAGNOSIS — E7800 Pure hypercholesterolemia, unspecified: Secondary | ICD-10-CM | POA: Diagnosis not present

## 2016-11-18 DIAGNOSIS — M79605 Pain in left leg: Secondary | ICD-10-CM

## 2016-11-18 DIAGNOSIS — Z23 Encounter for immunization: Secondary | ICD-10-CM | POA: Diagnosis not present

## 2016-11-18 DIAGNOSIS — I10 Essential (primary) hypertension: Secondary | ICD-10-CM | POA: Diagnosis not present

## 2016-11-18 DIAGNOSIS — C61 Malignant neoplasm of prostate: Secondary | ICD-10-CM

## 2016-11-18 DIAGNOSIS — I700 Atherosclerosis of aorta: Secondary | ICD-10-CM | POA: Diagnosis not present

## 2016-11-18 DIAGNOSIS — M79604 Pain in right leg: Secondary | ICD-10-CM

## 2016-11-18 NOTE — Progress Notes (Signed)
Subjective:    [name], male    DOB: 09-06-1928, 83 y.o.   MRN: [id number]  HPI Pt here for follow up and management of chronic medical problems which includes hypertension and hyperlipidemia. He is taking medication regularly.  Patient is doing well overall.  He does complain with his legs aching at times.  He is already had his blood work done and all cholesterol numbers were traditional lipid testing were excellent and good and at goal with an LDL C being 55 and HDL being 61 and triglycerides being good at 74.  The CBC has a normal white blood cell count.  Hemoglobin is decreased from previously and is now 10.9 and was 11.4.  The platelet count is adequate.  The blood sugar was good at 93.  The creatinine is slightly more elevated than in the past at 1.43.  The sodium was slightly elevated at 145 and the potassium and electrolytes were good.  Her vitamin D level was good at 49.3.  All liver function tests were normal.  Patient denies any chest pain or shortness of breath.  He is currently having no problems with his stomach including nausea vomiting diarrhea blood in the stool or black tarry bowel movements other than the discoloration from the iron preparation.  He does indicate that he was taking a generic Integra and this is when he started having some loose this.  He is now getting his regular Integra again he is going to go back on that and taking it 1 daily instead of 1 on Monday Wednesday and Friday.  After reviewing the notes with him he will continue to take his Crestor one on Monday Wednesday and Friday as all of his cholesterol numbers were good.  He has has some aching in his legs especially when he gets up in the morning and this seems to get better during the day.  This may be a result of his iron levels dropping from taking less iron.  He is going to go back on his regular integral +1 daily and if he has a loose bowel movements with this he will get back in touch with [place].   We will add a serum iron ferritin and B12 level to his current blood work.     Patient Active Problem List   Diagnosis Date Noted  . LBBB (left bundle branch block) 09/05/2016  . Preop cardiovascular exam 09/05/2016  . Prostate cancer ([place]) 08/22/2015  . Vitamin D deficiency 02/21/2015  . Elevated serum creatinine 08/15/2014  . Thoracic aortic atherosclerosis ([place]) 02/07/2014  . History of prostate cancer 09/06/2012  . Essential hypertension 01/20/2012  . Hyperlipidemia 01/20/2012  . Cardiovascular risk factor 01/20/2012  . PVC's (premature ventricular contractions) 01/20/2012   Outpatient Encounter Medications as of 11/18/2016  Medication Sig  . amLODipine (NORVASC) 5 MG tablet TAKE 1 TABLET BY MOUTH EVERY DAY  . aspirin 81 MG tablet Take 81 mg by mouth. Monday, Wednesday, Friday  . benazepril (LOTENSIN) 20 MG tablet TAKE 1 TABLET EVERY DAY  . Cholecalciferol 1000 UNITS capsule Take 1,000 Units by mouth daily.  Marland Kitchen FeFum-FePoly-FA-B Cmp-C-Biot (INTEGRA PLUS) CAPS TAKE 1 CAPSULE BY MOUTH EVERY DAY  . fenofibrate 54 MG tablet TAKE 1 TABLET BY MOUTH EVERY DAY  . fish oil-omega-3 fatty acids 1000 MG capsule Take 1 g by mouth daily.  . hydrochlorothiazide (HYDRODIURIL) 25 MG tablet TAKE 1 TABLET BY MOUTH EVERY DAY  . rosuvastatin (CRESTOR) 5 MG tablet TAKE 1  TABLET EVERY EVENING  . vitamin C (ASCORBIC ACID) 500 MG tablet Take 500 mg by mouth daily.  . [DISCONTINUED] hydrochlorothiazide (HYDRODIURIL) 25 MG tablet TAKE 1 TABLET BY MOUTH EVERY DAY  . [DISCONTINUED] oxyCODONE (OXY IR/ROXICODONE) 5 MG immediate release tablet Take 1 tablet (5 mg total) by mouth every 6 (six) hours as needed for moderate pain, severe pain or breakthrough pain.   No facility-administered encounter medications on file as of 11/18/2016.      Review of Systems  Constitutional: Negative.   HENT: Negative.   Eyes: Negative.   Respiratory: Negative.   Cardiovascular: Negative.   Gastrointestinal: Negative.     Endocrine: Negative.   Genitourinary: Negative.   Musculoskeletal: Positive for myalgias (legs).  Skin: Negative.   Allergic/Immunologic: Negative.   Neurological: Negative.   Hematological: Negative.   Psychiatric/Behavioral: Negative.        Objective:   Physical Exam  Constitutional: He is oriented to person, place, and time. He appears well-developed and well-nourished.  The patient is pleasant and relaxed and has just completed about 2 weeks ago a left hernia repair with Dr. Donne Hazel and is doing well following that.  HENT:  Head: Normocephalic and atraumatic.  Right Ear: External ear normal.  Left Ear: External ear normal.  Nose: Nose normal.  Mouth/Throat: Oropharynx is clear and moist. No oropharyngeal exudate.  Eyes: Conjunctivae and EOM are normal. Pupils are equal, round, and reactive to light. Right eye exhibits no discharge. Left eye exhibits no discharge. No scleral icterus.  Neck: Normal range of motion. Neck supple. No thyromegaly present.  No bruits thyromegaly or anterior cervical adenopathy  Cardiovascular: Normal rate, regular rhythm, normal heart sounds and intact distal pulses.  No murmur heard. Heart has a regular rate and rhythm at 72/min  Pulmonary/Chest: Effort normal and breath sounds normal. No respiratory distress. He has no wheezes. He has no rales. He exhibits no tenderness.  No axillary adenopathy and chest is clear anteriorly and posteriorly  Abdominal: Soft. Bowel sounds are normal. He exhibits no mass. There is no tenderness. There is no rebound and no guarding.  Status post repair left inguinal hernia.  No liver or spleen enlargement.  No masses bruits or inguinal adenopathy.  Genitourinary: Rectum normal and penis normal.  Genitourinary Comments: The prostate vault is empty.  There were no rectal masses.  There were no inguinal hernias present.  The external genitalia were within normal limits.  Musculoskeletal: Normal range of motion. He  exhibits no edema.  Lymphadenopathy:    He has no cervical adenopathy.  Neurological: He is alert and oriented to person, place, and time. He has normal reflexes. No cranial nerve deficit.  Skin: Skin is warm and dry. No rash noted.  Psychiatric: He has a normal mood and affect. His behavior is normal. Judgment and thought content normal.  Nursing note and vitals reviewed.    BP 140/74 (BP Location: Right Arm)   Pulse 73   Temp (!) 96.9 F (36.1 C) (Oral)   Ht 5\' 11"  (1.803 m)   Wt 139 lb (63 kg)   BMI 19.39 kg/m        Assessment & Plan:  1. Essential hypertension -Blood pressure is good he will continue with current treatment  2. Pure hypercholesterolemia -All cholesterol numbers were excellent he will continue with his Crestor just on Monday Wednesday and Friday  3. Vitamin D deficiency -Vitamin D level was good he will continue with current treatment  4. Prostate cancer ([place]) -The  patient is having no symptoms with voiding the prostate vault is empty and the rectal exam was negative. - Urinalysis, Complete  5. Pain in both lower extremities -This may very well be due to some iron deficiency as he is reduced his iron medicine.  He will go back on his iron medicine once daily.  6. Thoracic aortic atherosclerosis ([place]) -Continue with aggressive therapeutic lifestyle changes and current statin treatment regimen   Patient Instructions                       Medicare Annual Wellness Visit  [facility] and the medical providers at [facility] strive to bring you the best medical care.  In doing so we not only want to address your current medical conditions and concerns but also to detect new conditions early and prevent illness, disease and health-related problems.    Medicare offers a yearly Wellness Visit which allows our clinical staff to assess your need for preventative services including immunizations, lifestyle education, counseling to decrease  risk of preventable diseases and screening for fall risk and other medical concerns.    This visit is provided free of charge (no copay) for all Medicare recipients. The clinical pharmacists at [facility] have begun to conduct these Wellness Visits which will also include a thorough review of all your medications.    As you primary medical provider recommend that you make an appointment for your Annual Wellness Visit if you have not done so already this year.  You may set up this appointment before you leave today or you may call back ([phone]) and schedule an appointment.  Please make sure when you call that you mention that you are scheduling your Annual Wellness Visit with the clinical pharmacist so that the appointment may be made for the proper length of time.     Continue current medications. Continue good therapeutic lifestyle changes which include good diet and exercise. Fall precautions discussed with patient. If an FOBT was given today- please return it to our front desk. If you are over 59 years old - you may need Prevnar 65 or the adult Pneumonia vaccine.  **Flu shots are available--- please call and schedule a FLU-CLINIC appointment**  After your visit with [place] today you will receive a survey in the mail or online from [employer] regarding your care with [place]. Please take a moment to fill this out. Your feedback is very important to [place] as you can help [place] better understand your patient needs as well as improve your experience and satisfaction. WE CARE ABOUT YOU!!!  Please restart the Integra plus and take 1 daily If you develop loose bowel movements with this, please give [place] a call and let us know We are adding additional test to the blood work that has been drawn and this includes a B12 level and iron level and ferritin level. The patient should continue to drink plenty of fluids and stay well-hydrated   Arrie Senate MD

## 2016-11-18 NOTE — Patient Instructions (Addendum)
Medicare Annual Wellness Visit  [facility] and the medical providers at [facility] strive to bring you the best medical care.  In doing so we not only want to address your current medical conditions and concerns but also to detect new conditions early and prevent illness, disease and health-related problems.    Medicare offers a yearly Wellness Visit which allows our clinical staff to assess your need for preventative services including immunizations, lifestyle education, counseling to decrease risk of preventable diseases and screening for fall risk and other medical concerns.    This visit is provided free of charge (no copay) for all Medicare recipients. The clinical pharmacists at [facility] have begun to conduct these Wellness Visits which will also include a thorough review of all your medications.    As you primary medical provider recommend that you make an appointment for your Annual Wellness Visit if you have not done so already this year.  You may set up this appointment before you leave today or you may call back ([phone]) and schedule an appointment.  Please make sure when you call that you mention that you are scheduling your Annual Wellness Visit with the clinical pharmacist so that the appointment may be made for the proper length of time.     Continue current medications. Continue good therapeutic lifestyle changes which include good diet and exercise. Fall precautions discussed with patient. If an FOBT was given today- please return it to our front desk. If you are over 57 years old - you may need Prevnar 79 or the adult Pneumonia vaccine.  **Flu shots are available--- please call and schedule a FLU-CLINIC appointment**  After your visit with [place] today you will receive a survey in the mail or online from [employer] regarding your care with [place]. Please take a moment to fill this out. Your feedback is very  important to [place] as you can help [place] better understand your patient needs as well as improve your experience and satisfaction. WE CARE ABOUT YOU!!!  Please restart the Integra plus and take 1 daily If you develop loose bowel movements with this, please give [place] a call and let us know We are adding additional test to the blood work that has been drawn and this includes a B12 level and iron level and ferritin level. The patient should continue to drink plenty of fluids and stay well-hydrated

## 2016-11-19 ENCOUNTER — Telehealth: Payer: Self-pay | Admitting: *Deleted

## 2016-11-19 LAB — SPECIMEN STATUS REPORT

## 2016-11-19 LAB — IRON: IRON: 122 ug/dL (ref 38–169)

## 2016-11-19 LAB — VITAMIN B12: Vitamin B-12: 378 pg/mL (ref 232–1245)

## 2016-11-19 LAB — FERRITIN: Ferritin: 476 ng/mL — ABNORMAL HIGH (ref 30–400)

## 2016-11-19 NOTE — Telephone Encounter (Signed)
Labs reviewed with pt Verbalizes understanding

## 2016-11-20 ENCOUNTER — Other Ambulatory Visit: Payer: Self-pay | Admitting: *Deleted

## 2016-11-20 ENCOUNTER — Other Ambulatory Visit: Payer: Medicare HMO

## 2016-11-20 DIAGNOSIS — Z1211 Encounter for screening for malignant neoplasm of colon: Secondary | ICD-10-CM | POA: Diagnosis not present

## 2016-11-25 LAB — FECAL OCCULT BLOOD, IMMUNOCHEMICAL: FECAL OCCULT BLD: NEGATIVE

## 2017-01-10 DIAGNOSIS — Z0000 Encounter for general adult medical examination without abnormal findings: Secondary | ICD-10-CM | POA: Diagnosis not present

## 2017-01-10 DIAGNOSIS — Z7982 Long term (current) use of aspirin: Secondary | ICD-10-CM | POA: Diagnosis not present

## 2017-01-10 DIAGNOSIS — Z8546 Personal history of malignant neoplasm of prostate: Secondary | ICD-10-CM | POA: Diagnosis not present

## 2017-01-10 DIAGNOSIS — I10 Essential (primary) hypertension: Secondary | ICD-10-CM | POA: Diagnosis not present

## 2017-01-10 DIAGNOSIS — M545 Low back pain: Secondary | ICD-10-CM | POA: Diagnosis not present

## 2017-01-10 DIAGNOSIS — R233 Spontaneous ecchymoses: Secondary | ICD-10-CM | POA: Diagnosis not present

## 2017-01-10 DIAGNOSIS — Z6820 Body mass index (BMI) 20.0-20.9, adult: Secondary | ICD-10-CM | POA: Diagnosis not present

## 2017-01-10 DIAGNOSIS — B351 Tinea unguium: Secondary | ICD-10-CM | POA: Diagnosis not present

## 2017-01-10 DIAGNOSIS — I700 Atherosclerosis of aorta: Secondary | ICD-10-CM | POA: Diagnosis not present

## 2017-01-10 DIAGNOSIS — E7800 Pure hypercholesterolemia, unspecified: Secondary | ICD-10-CM | POA: Diagnosis not present

## 2017-02-12 ENCOUNTER — Ambulatory Visit (INDEPENDENT_AMBULATORY_CARE_PROVIDER_SITE_OTHER): Payer: Medicare HMO | Admitting: *Deleted

## 2017-02-12 VITALS — BP 146/73 | HR 84

## 2017-02-12 DIAGNOSIS — Z0130 Encounter for examination of blood pressure without abnormal findings: Secondary | ICD-10-CM

## 2017-02-12 NOTE — Progress Notes (Signed)
Pt wants BP ck Will follow up with PCP

## 2017-02-19 ENCOUNTER — Other Ambulatory Visit: Payer: Self-pay | Admitting: Family Medicine

## 2017-03-25 ENCOUNTER — Other Ambulatory Visit: Payer: Self-pay | Admitting: Family Medicine

## 2017-04-17 ENCOUNTER — Ambulatory Visit (INDEPENDENT_AMBULATORY_CARE_PROVIDER_SITE_OTHER): Payer: Medicare HMO | Admitting: Family Medicine

## 2017-04-17 ENCOUNTER — Encounter: Payer: Self-pay | Admitting: Family Medicine

## 2017-04-17 VITALS — BP 118/60 | HR 70 | Temp 97.1°F | Ht 71.0 in | Wt 145.0 lb

## 2017-04-17 DIAGNOSIS — E7800 Pure hypercholesterolemia, unspecified: Secondary | ICD-10-CM | POA: Diagnosis not present

## 2017-04-17 DIAGNOSIS — C61 Malignant neoplasm of prostate: Secondary | ICD-10-CM

## 2017-04-17 DIAGNOSIS — E559 Vitamin D deficiency, unspecified: Secondary | ICD-10-CM

## 2017-04-17 DIAGNOSIS — D509 Iron deficiency anemia, unspecified: Secondary | ICD-10-CM

## 2017-04-17 DIAGNOSIS — R69 Illness, unspecified: Secondary | ICD-10-CM | POA: Diagnosis not present

## 2017-04-17 DIAGNOSIS — R710 Precipitous drop in hematocrit: Secondary | ICD-10-CM | POA: Diagnosis not present

## 2017-04-17 DIAGNOSIS — H6123 Impacted cerumen, bilateral: Secondary | ICD-10-CM | POA: Diagnosis not present

## 2017-04-17 DIAGNOSIS — I10 Essential (primary) hypertension: Secondary | ICD-10-CM

## 2017-04-17 DIAGNOSIS — I700 Atherosclerosis of aorta: Secondary | ICD-10-CM

## 2017-04-17 MED ORDER — GLUCOSE BLOOD VI STRP: ORAL_STRIP | 11 refills | Status: DC

## 2017-04-17 NOTE — Progress Notes (Signed)
Subjective:    [name], male    DOB: 18-Dec-1928, 84 y.o.   MRN: [id number]  HPI Pt here for follow up and management of chronic medical problems which includes hyperlipidemia and hypertension. He is taking medication regularly.  The patient is doing well today with no specific complaints.  His vital signs are stable and his weight is up a few pounds since the last visit.  His BMI is still good at 19.4.  He brings in outside blood pressures for review and some blood sugars for review and all of these are good with blood pressures ranging from the 120s over the 60s up to as high as the low 140s over 79.  The patient does have an iron deficiency anemia prostate cancer and thoracic aortic atherosclerosis.  He also has hyperlipidemia and currently takes fenofibrate and omega-3 fatty acids along with Crestor.  He takes Integra plus for his iron deficiency anemia.  The patient is feeling well.  Today is a 74-monthanniversary of losing his wife and he is somewhat down about this.  I reassured him that that was normal.  He is now reassuring a friend who is just lost his wife.  He denies any chest pain pressure tightness or shortness of breath anymore than usual.  He denies any increased problems with his reflux.  He has not seen any blood in the stool or had any black tarry bowel movements and is passing his water without problems.    Patient Active Problem List   Diagnosis Date Noted  . LBBB (left bundle branch block) 09/05/2016  . Preop cardiovascular exam 09/05/2016  . Prostate cancer ([place] 08/22/2015  . Vitamin D deficiency 02/21/2015  . Elevated serum creatinine 08/15/2014  . Thoracic aortic atherosclerosis ([place] 02/07/2014  . History of prostate cancer 09/06/2012  . Essential hypertension 01/20/2012  . Hyperlipidemia 01/20/2012  . Cardiovascular risk factor 01/20/2012  . PVC's (premature ventricular contractions) 01/20/2012   Outpatient Encounter Medications as of 04/17/2017    Medication Sig  . amLODipine (NORVASC) 5 MG tablet TAKE 1 TABLET BY MOUTH EVERY DAY  . aspirin 81 MG tablet Take 81 mg by mouth. Monday, Wednesday, Friday  . benazepril (LOTENSIN) 20 MG tablet TAKE 1 TABLET EVERY DAY  . Cholecalciferol 1000 UNITS capsule Take 1,000 Units by mouth daily.  .Marland KitchenFeFum-FePoly-FA-B Cmp-C-Biot (INTEGRA PLUS) CAPS TAKE 1 CAPSULE BY MOUTH EVERY DAY  . fenofibrate 54 MG tablet TAKE 1 TABLET BY MOUTH EVERY DAY  . fish oil-omega-3 fatty acids 1000 MG capsule Take 1 g by mouth daily.  . hydrochlorothiazide (HYDRODIURIL) 25 MG tablet TAKE 1 TABLET BY MOUTH EVERY DAY  . rosuvastatin (CRESTOR) 5 MG tablet TAKE 1 TABLET EVERY EVENING  . vitamin C (ASCORBIC ACID) 500 MG tablet Take 500 mg by mouth daily.  . [DISCONTINUED] hydrochlorothiazide (HYDRODIURIL) 25 MG tablet TAKE 1 TABLET BY MOUTH EVERY DAY   No facility-administered encounter medications on file as of 04/17/2017.       Review of Systems  Constitutional: Negative.   HENT: Negative.   Eyes: Negative.   Respiratory: Negative.   Cardiovascular: Negative.   Gastrointestinal: Negative.   Endocrine: Negative.   Genitourinary: Negative.   Musculoskeletal: Negative.   Skin: Negative.   Allergic/Immunologic: Negative.   Neurological: Negative.   Hematological: Negative.   Psychiatric/Behavioral: Negative.        Objective:   Physical Exam  Constitutional: He is oriented to person, place, and time. He appears well-developed and  well-nourished. No distress.  The patient is pleasant and relaxed despite the anniversary death of his wife of 9 months.  HENT:  Head: Normocephalic and atraumatic.  Nose: Nose normal.  Mouth/Throat: Oropharynx is clear and moist. No oropharyngeal exudate.  Bilateral ear cerumen  Eyes: Pupils are equal, round, and reactive to light. Conjunctivae and EOM are normal. Right eye exhibits no discharge. Left eye exhibits no discharge. No scleral icterus.  Neck: Normal range of motion. Neck  supple. No thyromegaly present.  No thyromegaly anterior cervical adenopathy or bruits  Cardiovascular: Normal rate, regular rhythm, normal heart sounds and intact distal pulses.  No murmur heard. Heart is regular at 72/min  Pulmonary/Chest: Effort normal and breath sounds normal. No respiratory distress. He has no wheezes. He has no rales. He exhibits no tenderness.  Clear anteriorly and posteriorly with no axillary adenopathy or chest wall masses  Abdominal: Soft. Bowel sounds are normal. He exhibits no mass. There is no tenderness. There is no rebound and no guarding.  No liver or spleen enlargement masses bruits or inguinal adenopathy  Musculoskeletal: Normal range of motion. He exhibits no edema.  Lymphadenopathy:    He has no cervical adenopathy.  Neurological: He is alert and oriented to person, place, and time. He has normal reflexes. No cranial nerve deficit.  Skin: Skin is warm and dry. No rash noted.  Psychiatric: He has a normal mood and affect. His behavior is normal. Judgment and thought content normal.  Nursing note and vitals reviewed.  BP 118/60   Pulse 70   Temp (!) 97.1 F (36.2 C) (Oral)   Ht _0  (1.803 m)   Wt 145 lb (65.8 kg)   BMI 20.22 kg/m         Assessment & Plan:  1. Pure hypercholesterolemia -Continue with current treatment pending results of lab work - CBC with Differential/Platelet - Lipid panel  2. Essential hypertension -The blood pressure is good he will continue with current treatment - CBC with Differential/Platelet - BMP8+EGFR - Hepatic function panel  3. Vitamin D deficiency -Continue with vitamin D replacement pending results of lab work - CBC with Differential/Platelet - VITAMIN D 25 Hydroxy (Vit-D Deficiency, Fractures)  4. Prostate cancer [facility]) -Rectal exam done in the fall and patient is not having any symptoms passing his water currently. - CBC with Differential/Platelet  5. Thoracic aortic atherosclerosis  ([place]) -Continue with aggressive therapeutic lifestyle changes omega-3 fatty acids and Crestor along with diet and exercise  6. Hemoglobin decreased -Continue with iron replacement  7. Iron deficiency anemia, unspecified iron deficiency anemia type -Continue with Integra plus for iron replacement  8. Excessive cerumen in ear canal, bilateral -Ear irrigation to remove cerumen  Meds ordered this encounter  Medications  . glucose blood test strip    Sig: Check BS QD and PRN    Dispense:  100 each    Refill:  11    (non- diabetic)   Patient Instructions                       Medicare Annual Wellness Visit  [facility] and the medical providers at [facility] strive to bring you the best medical care.  In doing so we not only want to address your current medical conditions and concerns but also to detect new conditions early and prevent illness, disease and health-related problems.    Medicare offers a yearly Wellness Visit which allows our clinical staff to assess your  need for preventative services including immunizations, lifestyle education, counseling to decrease risk of preventable diseases and screening for fall risk and other medical concerns.    This visit is provided free of charge (no copay) for all Medicare recipients. The clinical pharmacists at [facility] have begun to conduct these Wellness Visits which will also include a thorough review of all your medications.    As you primary medical provider recommend that you make an appointment for your Annual Wellness Visit if you have not done so already this year.  You may set up this appointment before you leave today or you may call back ([phone]) and schedule an appointment.  Please make sure when you call that you mention that you are scheduling your Annual Wellness Visit with the clinical pharmacist so that the appointment may be made for the proper length of time.     Continue  current medications. Continue good therapeutic lifestyle changes which include good diet and exercise. Fall precautions discussed with patient. If an FOBT was given today- please return it to our front desk. If you are over 79 years old - you may need Prevnar 56 or the adult Pneumonia vaccine.  **Flu shots are available--- please call and schedule a FLU-CLINIC appointment**  After your visit with [place] today you will receive a survey in the mail or online from [employer] regarding your care with [place]. Please take a moment to fill this out. Your feedback is very important to [place] as you can help [place] better understand your patient needs as well as improve your experience and satisfaction. WE CARE ABOUT YOU!!!   Continue with current treatment and with as aggressive therapeutic lifestyle changes as possible.  Arrie Senate MD

## 2017-04-17 NOTE — Patient Instructions (Addendum)
Medicare Annual Wellness Visit  [facility] and the medical providers at [facility] strive to bring you the best medical care.  In doing so we not only want to address your current medical conditions and concerns but also to detect new conditions early and prevent illness, disease and health-related problems.    Medicare offers a yearly Wellness Visit which allows our clinical staff to assess your need for preventative services including immunizations, lifestyle education, counseling to decrease risk of preventable diseases and screening for fall risk and other medical concerns.    This visit is provided free of charge (no copay) for all Medicare recipients. The clinical pharmacists at [facility] have begun to conduct these Wellness Visits which will also include a thorough review of all your medications.    As you primary medical provider recommend that you make an appointment for your Annual Wellness Visit if you have not done so already this year.  You may set up this appointment before you leave today or you may call back ([phone]) and schedule an appointment.  Please make sure when you call that you mention that you are scheduling your Annual Wellness Visit with the clinical pharmacist so that the appointment may be made for the proper length of time.     Continue current medications. Continue good therapeutic lifestyle changes which include good diet and exercise. Fall precautions discussed with patient. If an FOBT was given today- please return it to our front desk. If you are over 13 years old - you may need Prevnar 44 or the adult Pneumonia vaccine.  **Flu shots are available--- please call and schedule a FLU-CLINIC appointment**  After your visit with [place] today you will receive a survey in the mail or online from [employer] regarding your care with [place]. Please take a moment to fill this out. Your feedback is very  important to [place] as you can help [place] better understand your patient needs as well as improve your experience and satisfaction. WE CARE ABOUT YOU!!!   Continue with current treatment and with as aggressive therapeutic lifestyle changes as possible.

## 2017-04-18 LAB — HEPATIC FUNCTION PANEL
ALBUMIN: 4 g/dL (ref 3.5–4.7)
ALK PHOS: 42 IU/L (ref 39–117)
ALT: 14 IU/L (ref 0–44)
AST: 21 IU/L (ref 0–40)
Bilirubin Total: 0.4 mg/dL (ref 0.0–1.2)
Bilirubin, Direct: 0.16 mg/dL (ref 0.00–0.40)
Total Protein: 6.2 g/dL (ref 6.0–8.5)

## 2017-04-18 LAB — LIPID PANEL
CHOLESTEROL TOTAL: 127 mg/dL (ref 100–199)
Chol/HDL Ratio: 2.2 ratio (ref 0.0–5.0)
HDL: 58 mg/dL (ref >39)
LDL CALC: 46 mg/dL (ref 0–99)
TRIGLYCERIDES: 117 mg/dL (ref 0–149)
VLDL CHOLESTEROL CAL: 23 mg/dL (ref 5–40)

## 2017-04-18 LAB — CBC WITH DIFFERENTIAL/PLATELET
BASOS ABS: 0 10*3/uL (ref 0.0–0.2)
Basos: 0 %
EOS (ABSOLUTE): 0.2 10*3/uL (ref 0.0–0.4)
Eos: 3 %
Hematocrit: 34.9 % — ABNORMAL LOW (ref 37.5–51.0)
Hemoglobin: 11.5 g/dL — ABNORMAL LOW (ref 13.0–17.7)
Immature Grans (Abs): 0 10*3/uL (ref 0.0–0.1)
Immature Granulocytes: 0 %
LYMPHS ABS: 1.1 10*3/uL (ref 0.7–3.1)
Lymphs: 15 %
MCH: 31 pg (ref 26.6–33.0)
MCHC: 33 g/dL (ref 31.5–35.7)
MCV: 94 fL (ref 79–97)
Monocytes Absolute: 0.5 10*3/uL (ref 0.1–0.9)
Monocytes: 7 %
NEUTROS ABS: 5.3 10*3/uL (ref 1.4–7.0)
Neutrophils: 75 %
PLATELETS: 313 10*3/uL (ref 150–379)
RBC: 3.71 x10E6/uL — AB (ref 4.14–5.80)
RDW: 13.1 % (ref 12.3–15.4)
WBC: 7.1 10*3/uL (ref 3.4–10.8)

## 2017-04-18 LAB — BMP8+EGFR
BUN / CREAT RATIO: 16 (ref 10–24)
BUN: 21 mg/dL (ref 8–27)
CHLORIDE: 105 mmol/L (ref 96–106)
CO2: 23 mmol/L (ref 20–29)
Calcium: 9.2 mg/dL (ref 8.6–10.2)
Creatinine, Ser: 1.34 mg/dL — ABNORMAL HIGH (ref 0.76–1.27)
GFR calc Af Amer: 55 mL/min/{1.73_m2} — ABNORMAL LOW (ref >59)
GFR calc non Af Amer: 47 mL/min/{1.73_m2} — ABNORMAL LOW (ref >59)
GLUCOSE: 92 mg/dL (ref 65–99)
POTASSIUM: 3.9 mmol/L (ref 3.5–5.2)
SODIUM: 143 mmol/L (ref 134–144)

## 2017-04-18 LAB — VITAMIN D 25 HYDROXY (VIT D DEFICIENCY, FRACTURES): VIT D 25 HYDROXY: 44 ng/mL (ref 30.0–100.0)

## 2017-04-30 ENCOUNTER — Other Ambulatory Visit: Payer: Self-pay | Admitting: Family Medicine

## 2017-05-21 ENCOUNTER — Other Ambulatory Visit: Payer: Self-pay | Admitting: Family Medicine

## 2017-07-06 ENCOUNTER — Other Ambulatory Visit: Payer: Self-pay | Admitting: Family Medicine

## 2017-07-13 DIAGNOSIS — M9905 Segmental and somatic dysfunction of pelvic region: Secondary | ICD-10-CM | POA: Diagnosis not present

## 2017-07-13 DIAGNOSIS — M9904 Segmental and somatic dysfunction of sacral region: Secondary | ICD-10-CM | POA: Diagnosis not present

## 2017-07-13 DIAGNOSIS — M9903 Segmental and somatic dysfunction of lumbar region: Secondary | ICD-10-CM | POA: Diagnosis not present

## 2017-07-13 DIAGNOSIS — M5137 Other intervertebral disc degeneration, lumbosacral region: Secondary | ICD-10-CM | POA: Diagnosis not present

## 2017-07-14 DIAGNOSIS — M9903 Segmental and somatic dysfunction of lumbar region: Secondary | ICD-10-CM | POA: Diagnosis not present

## 2017-07-14 DIAGNOSIS — M9904 Segmental and somatic dysfunction of sacral region: Secondary | ICD-10-CM | POA: Diagnosis not present

## 2017-07-14 DIAGNOSIS — M9905 Segmental and somatic dysfunction of pelvic region: Secondary | ICD-10-CM | POA: Diagnosis not present

## 2017-07-14 DIAGNOSIS — M5137 Other intervertebral disc degeneration, lumbosacral region: Secondary | ICD-10-CM | POA: Diagnosis not present

## 2017-07-15 DIAGNOSIS — M5137 Other intervertebral disc degeneration, lumbosacral region: Secondary | ICD-10-CM | POA: Diagnosis not present

## 2017-07-15 DIAGNOSIS — M9903 Segmental and somatic dysfunction of lumbar region: Secondary | ICD-10-CM | POA: Diagnosis not present

## 2017-07-15 DIAGNOSIS — M9905 Segmental and somatic dysfunction of pelvic region: Secondary | ICD-10-CM | POA: Diagnosis not present

## 2017-07-15 DIAGNOSIS — M9904 Segmental and somatic dysfunction of sacral region: Secondary | ICD-10-CM | POA: Diagnosis not present

## 2017-07-20 DIAGNOSIS — M9904 Segmental and somatic dysfunction of sacral region: Secondary | ICD-10-CM | POA: Diagnosis not present

## 2017-07-20 DIAGNOSIS — M9903 Segmental and somatic dysfunction of lumbar region: Secondary | ICD-10-CM | POA: Diagnosis not present

## 2017-07-20 DIAGNOSIS — M5137 Other intervertebral disc degeneration, lumbosacral region: Secondary | ICD-10-CM | POA: Diagnosis not present

## 2017-07-20 DIAGNOSIS — M9905 Segmental and somatic dysfunction of pelvic region: Secondary | ICD-10-CM | POA: Diagnosis not present

## 2017-07-21 ENCOUNTER — Encounter: Payer: Self-pay | Admitting: Family Medicine

## 2017-07-21 ENCOUNTER — Ambulatory Visit: Payer: Medicare HMO | Admitting: Nurse Practitioner

## 2017-07-21 ENCOUNTER — Ambulatory Visit (INDEPENDENT_AMBULATORY_CARE_PROVIDER_SITE_OTHER): Payer: Medicare HMO | Admitting: Family Medicine

## 2017-07-21 VITALS — BP 158/74 | HR 83 | Temp 97.1°F | Ht 71.0 in | Wt 151.8 lb

## 2017-07-21 DIAGNOSIS — M5441 Lumbago with sciatica, right side: Secondary | ICD-10-CM

## 2017-07-21 DIAGNOSIS — M5442 Lumbago with sciatica, left side: Secondary | ICD-10-CM

## 2017-07-21 MED ORDER — METHYLPREDNISOLONE ACETATE 80 MG/ML IJ SUSP
80.0000 mg | Freq: Once | INTRAMUSCULAR | Status: AC
  Administered 2017-07-21: 80 mg via INTRAMUSCULAR

## 2017-07-21 NOTE — Progress Notes (Signed)
   HPI  Patient presents today here with low back pain.  Patient describes midline and bilateral low back pain when he is walking that radiates down the bilateral legs. He denies any bowel or bladder dysfunction, leg weakness  He states that he has seen chiropractic care and was x-rayed and stated that he had a little bit of arthritis. He has had minimal improvement with Aleve, also with chiropractic care.  Patient states that pain is been going on for 2 or 3 months, however worse over the last 2 or 3 weeks. He had almost complete improvement after chiropractic care, then worsening with resumed activities. Then almost complete relief with Aleve then worsening after resumed activities.    PMH: Smoking status noted ROS: Per HPI  Objective: BP (!) 158/74   Pulse 83   Temp (!) 97.1 F (36.2 C) (Oral)   Ht 5\' 11"  (1.803 m)   Wt 151 lb 12.8 oz (68.9 kg)   BMI 21.17 kg/m  Gen: NAD, alert, cooperative with exam HEENT: NCAT CV: RRR, good S1/S2, no murmur Resp: CTABL, no wheezes, non-labored Ext: No edema, warm Neuro: Alert and oriented, strength 5/5 in bilateral lower extremities, sensation intact, 2+ patellar tendon reflexes bilaterally MSK:  No tenderness to palpation of bilateral paraspinal muscles in the lumbar area or midline spine  Assessment and plan:  #Low back pain with bilateral sciatica No red flags IM Depo-Medrol given. Discussed supportive care Return to clinic with any concerns   Meds ordered this encounter  Medications  . methylPREDNISolone acetate (DEPO-MEDROL) injection 80 mg    Laroy Apple, MD [facility] 07/21/2017, 9:34 AM

## 2017-07-21 NOTE — Patient Instructions (Signed)
Great to see you!   

## 2017-08-20 ENCOUNTER — Ambulatory Visit: Payer: Medicare HMO | Admitting: Family Medicine

## 2017-08-24 ENCOUNTER — Ambulatory Visit (INDEPENDENT_AMBULATORY_CARE_PROVIDER_SITE_OTHER): Payer: Medicare HMO | Admitting: Family Medicine

## 2017-08-24 ENCOUNTER — Other Ambulatory Visit: Payer: Self-pay | Admitting: Family Medicine

## 2017-08-24 ENCOUNTER — Ambulatory Visit (INDEPENDENT_AMBULATORY_CARE_PROVIDER_SITE_OTHER): Payer: Medicare HMO

## 2017-08-24 ENCOUNTER — Encounter: Payer: Self-pay | Admitting: Family Medicine

## 2017-08-24 VITALS — BP 146/75 | HR 79 | Temp 97.2°F | Ht 71.0 in | Wt 149.0 lb

## 2017-08-24 DIAGNOSIS — M545 Low back pain: Secondary | ICD-10-CM

## 2017-08-24 DIAGNOSIS — M5440 Lumbago with sciatica, unspecified side: Secondary | ICD-10-CM

## 2017-08-24 NOTE — Patient Instructions (Addendum)
Continue with Aleve twice daily after breakfast and supper for the next to 10-14 days and we will give you an appointment to come back in 2 weeks. Check blood pressures regularly Watch salt intake If the pain does not get better by that time we will consider getting an MRI of the back to pinpoint the cause of the pain more readily. Use warm wet compresses to the low back 20 minutes 3 or 4 times daily, no heating pad. To protect your stomach you can purchase ranitidine or Zantac 150 mg over-the-counter.  If you go to [facility] he can get the equate brand of this and will be much cheaper.  He should take this twice daily before breakfast and supper and take the Aleve after breakfast and supper. The patient should understand that if he starts having trouble with his stomach or his blood pressure goes up he should discontinue the Aleve.

## 2017-08-24 NOTE — Progress Notes (Signed)
Subjective:    [name], male    DOB: 08-13-1928, 84 y.o.   MRN: [id number]  HPI Pt here for low back pain.  The patient has on some ongoing low back pain which is worse with standing.  He does not describe any injury.  It does radiate down both legs.  The patient is pleasant and alert and relates his history well.  He normally used to walk quite a bit but in the heat of the summer he reduced his walking and just before he did that he noticed that his legs were bothering him especially when he walked up the hill.  Since he is reduce the walking now he seems to have had increasing low back pain and pain radiating down both legs.  He did try some aspirin and this did not help he did take some Aleve and this did seem to help his pain and allowed him to do more activity specifically mowing the yard with a riding mower.  He does continue to have off and on ongoing pain in the low back radiating down both legs which is better with sitting and worse with standing and walking.   Patient Active Problem List   Diagnosis Date Noted  . LBBB (left bundle branch block) 09/05/2016  . Preop cardiovascular exam 09/05/2016  . Prostate cancer ([place]) 08/22/2015  . Vitamin D deficiency 02/21/2015  . Elevated serum creatinine 08/15/2014  . Thoracic aortic atherosclerosis ([place]) 02/07/2014  . History of prostate cancer 09/06/2012  . Essential hypertension 01/20/2012  . Hyperlipidemia 01/20/2012  . Cardiovascular risk factor 01/20/2012  . PVC's (premature ventricular contractions) 01/20/2012   Outpatient Encounter Medications as of 08/24/2017  Medication Sig  . amLODipine (NORVASC) 5 MG tablet TAKE 1 TABLET BY MOUTH EVERY DAY  . aspirin 81 MG tablet Take 81 mg by mouth. Monday, Wednesday, Friday  . benazepril (LOTENSIN) 20 MG tablet TAKE 1 TABLET EVERY DAY  . Cholecalciferol 1000 UNITS capsule Take 1,000 Units by mouth daily.  Marland Kitchen FeFum-FePoly-FA-B Cmp-C-Biot (INTEGRA PLUS) CAPS TAKE 1 CAPSULE BY  MOUTH EVERY DAY  . fenofibrate 54 MG tablet TAKE 1 TABLET BY MOUTH EVERY DAY  . fish oil-omega-3 fatty acids 1000 MG capsule Take 1 g by mouth daily.  Marland Kitchen glucose blood test strip Check BS QD and PRN  . hydrochlorothiazide (HYDRODIURIL) 25 MG tablet TAKE 1 TABLET BY MOUTH EVERY DAY  . rosuvastatin (CRESTOR) 5 MG tablet TAKE 1 TABLET BY MOUTH EVERY DAY IN THE EVENING  . vitamin C (ASCORBIC ACID) 500 MG tablet Take 500 mg by mouth daily.  . [DISCONTINUED] benazepril (LOTENSIN) 20 MG tablet TAKE 1 TABLET EVERY DAY   No facility-administered encounter medications on file as of 08/24/2017.       Review of Systems  Constitutional: Negative.   HENT: Negative.   Eyes: Negative.   Respiratory: Negative.   Cardiovascular: Negative.   Gastrointestinal: Negative.   Endocrine: Negative.   Genitourinary: Negative.   Musculoskeletal: Positive for back pain (low ).  Skin: Negative.   Allergic/Immunologic: Negative.   Neurological: Negative.   Hematological: Negative.   Psychiatric/Behavioral: Negative.        Objective:   Physical Exam  Constitutional: He is oriented to person, place, and time. He appears well-developed and well-nourished. No distress.  HENT:  Head: Normocephalic and atraumatic.  Eyes: Pupils are equal, round, and reactive to light. Conjunctivae and EOM are normal. Right eye exhibits no discharge. Left eye exhibits no discharge. No  scleral icterus.  Neck: Normal range of motion. Neck supple. No thyromegaly present.  Cardiovascular: Normal rate, regular rhythm, normal heart sounds and intact distal pulses.  No murmur heard. Pulmonary/Chest: Effort normal and breath sounds normal. No respiratory distress. He has no wheezes. He has no rales.  Abdominal: Soft. Bowel sounds are normal. He exhibits no mass. There is no tenderness.  Musculoskeletal: Normal range of motion. He exhibits no edema.  Leg raising was good bilaterally and hip abduction was good bilaterally.  There was  slight tenderness to the left of the lower lumbar spine to palpation.  No rash.  Lymphadenopathy:    He has no cervical adenopathy.  Neurological: He is alert and oriented to person, place, and time. He has normal reflexes. No cranial nerve deficit.  Reflexes were equal bilaterally.  Skin: Skin is warm and dry. No rash noted.  Psychiatric: He has a normal mood and affect. His behavior is normal. Judgment and thought content normal.  The patient's mood affect and demeanor were normal for him.  Nursing note and vitals reviewed.  BP (!) 146/75 (BP Location: Left Arm)   Pulse 79   Temp (!) 97.2 F (36.2 C) (Oral)   Ht 5\' 11"  (1.803 m)   Wt 149 lb (67.6 kg)   BMI 20.78 kg/m         Assessment & Plan:  1. Acute bilateral low back pain with sciatica, sciatica laterality unspecified -Take Aleve as directed twice daily for the next 10 to 14 days after breakfast and supper -Take a Zantac or ranitidine 150 mg twice daily before breakfast and supper to protect stomach -Check blood pressures regularly and watch sodium intake -Return to the office in 10 to 14 days for recheck.  If patient is not better we will consider further testing.  Patient Instructions  Continue with Aleve twice daily after breakfast and supper for the next to 10-14 days and we will give you an appointment to come back in 2 weeks. Check blood pressures regularly Watch salt intake If the pain does not get better by that time we will consider getting an MRI of the back to pinpoint the cause of the pain more readily. Use warm wet compresses to the low back 20 minutes 3 or 4 times daily, no heating pad. To protect your stomach you can purchase ranitidine or Zantac 150 mg over-the-counter.  If you go to [facility] he can get the equate brand of this and will be much cheaper.  He should take this twice daily before breakfast and supper and take the Aleve after breakfast and supper. The patient should understand that if he starts  having trouble with his stomach or his blood pressure goes up he should discontinue the Aleve.  Arrie Senate MD

## 2017-08-27 ENCOUNTER — Other Ambulatory Visit: Payer: Self-pay | Admitting: Family Medicine

## 2017-09-07 ENCOUNTER — Encounter: Payer: Self-pay | Admitting: Family Medicine

## 2017-09-07 ENCOUNTER — Ambulatory Visit (INDEPENDENT_AMBULATORY_CARE_PROVIDER_SITE_OTHER): Payer: Medicare HMO | Admitting: Family Medicine

## 2017-09-07 VITALS — BP 152/71 | HR 101 | Temp 97.7°F | Ht 71.0 in | Wt 150.0 lb

## 2017-09-07 DIAGNOSIS — M47816 Spondylosis without myelopathy or radiculopathy, lumbar region: Secondary | ICD-10-CM | POA: Diagnosis not present

## 2017-09-07 DIAGNOSIS — M5441 Lumbago with sciatica, right side: Secondary | ICD-10-CM | POA: Diagnosis not present

## 2017-09-07 DIAGNOSIS — M5442 Lumbago with sciatica, left side: Secondary | ICD-10-CM | POA: Diagnosis not present

## 2017-09-07 MED ORDER — PREDNISONE 10 MG PO TABS: ORAL_TABLET | ORAL | 0 refills | Status: DC

## 2017-09-07 NOTE — Patient Instructions (Signed)
The patient will stop the Aleve He will go ahead and start a course of prednisone for 8 days starting in the morning He will continue with the ranitidine to protect his stomach until he finishes the prednisone taper Start walking at least once daily after breakfast We will go ahead and arrange for him to see the orthopedic and if he is not better he will keep that appointment.  If he gets better with the prednisone by mouth we will call us and we will cancel the appointment with orthopedic surgeon. He will take a copy of the x-ray results with him to see the orthopedic surgeon.

## 2017-09-07 NOTE — Addendum Note (Signed)
Addended by: Zannie Cove on: 09/07/2017 03:33 PM   Modules accepted: Orders

## 2017-09-07 NOTE — Addendum Note (Signed)
Addended by: Zannie Cove on: 09/07/2017 03:32 PM   Modules accepted: Orders

## 2017-09-07 NOTE — Progress Notes (Signed)
Subjective:    [name], male    DOB: July 23, 1928, 84 y.o.   MRN: [id number]  HPI Patient here today for 2 week follow up on back pain.  Patient is here for a follow-up of back pain.  He brings blood pressures in for review.  The patient has been taking ibuprofen with Zantac.  Recent x-ray showed multilevel degenerative disc disease with no acute abnormality.  There is also a aortic atherosclerosis.  The patient continues to hurt in his low back at the lumbo-sacral level.  When he is moving or walking it hurts more.  When he is sitting down it does not hurt at all.  He has been taking Aleve and ranitidine to protect his stomach from the Aleve.  He brings in blood pressure for review and the majority of these are good.  The pain is not severe it is mild but is aggravating and he lives by himself.    Patient Active Problem List   Diagnosis Date Noted  . LBBB (left bundle branch block) 09/05/2016  . Preop cardiovascular exam 09/05/2016  . Prostate cancer ([place]) 08/22/2015  . Vitamin D deficiency 02/21/2015  . Elevated serum creatinine 08/15/2014  . Thoracic aortic atherosclerosis ([place]) 02/07/2014  . History of prostate cancer 09/06/2012  . Essential hypertension 01/20/2012  . Hyperlipidemia 01/20/2012  . Cardiovascular risk factor 01/20/2012  . PVC's (premature ventricular contractions) 01/20/2012   Outpatient Encounter Medications as of 09/07/2017  Medication Sig  . amLODipine (NORVASC) 5 MG tablet TAKE 1 TABLET BY MOUTH EVERY DAY  . aspirin 81 MG tablet Take 81 mg by mouth. Monday, Wednesday, Friday  . benazepril (LOTENSIN) 20 MG tablet TAKE 1 TABLET EVERY DAY  . Cholecalciferol 1000 UNITS capsule Take 1,000 Units by mouth daily.  Marland Kitchen FeFum-FePoly-FA-B Cmp-C-Biot (INTEGRA PLUS) CAPS TAKE 1 CAPSULE BY MOUTH EVERY DAY  . fenofibrate 54 MG tablet TAKE 1 TABLET BY MOUTH EVERY DAY  . fish oil-omega-3 fatty acids 1000 MG capsule Take 1 g by mouth daily.  Marland Kitchen glucose blood test  strip Check BS QD and PRN  . hydrochlorothiazide (HYDRODIURIL) 25 MG tablet TAKE 1 TABLET BY MOUTH EVERY DAY  . rosuvastatin (CRESTOR) 5 MG tablet TAKE 1 TABLET BY MOUTH EVERY DAY IN THE EVENING  . vitamin C (ASCORBIC ACID) 500 MG tablet Take 500 mg by mouth daily.   No facility-administered encounter medications on file as of 09/07/2017.       Review of Systems  Constitutional: Negative.   HENT: Negative.   Eyes: Negative.   Respiratory: Negative.   Cardiovascular: Negative.   Gastrointestinal: Negative.   Endocrine: Negative.   Genitourinary: Negative.   Musculoskeletal: Positive for back pain (low).  Skin: Negative.   Allergic/Immunologic: Negative.   Neurological: Negative.   Hematological: Negative.   Psychiatric/Behavioral: Negative.        Objective:   Physical Exam  Constitutional: He is oriented to person, place, and time. He appears well-developed and well-nourished. No distress.  HENT:  Head: Normocephalic and atraumatic.  Eyes: Pupils are equal, round, and reactive to light. Conjunctivae and EOM are normal. Right eye exhibits no discharge. Left eye exhibits no discharge. No scleral icterus.  Neck: Normal range of motion.  Musculoskeletal: Normal range of motion. He exhibits no edema or tenderness.  Neurological: He is alert and oriented to person, place, and time.  Lower extremity reflexes are 2+ on the left 1+ on the right.  Skin: Skin is warm and dry. No  rash noted.  Psychiatric: He has a normal mood and affect. His behavior is normal. Judgment and thought content normal.  The patient's mood affect behavior are all normal for him and unchanged from the previous visit.  Nursing note and vitals reviewed.  BP (!) 152/71   Pulse (!) 101   Temp 97.7 F (36.5 C) (Oral)   Ht 5\' 11"  (1.803 m)   Wt 150 lb (68 kg)   BMI 20.92 kg/m   Recent x-rays of the lumbosacral spine revealed osteoarthritis changes especially at L4 and 5.  There is osteophyte formation also.   These x-rays were reviewed with the patient he was given a copy of them for his records.      Assessment & Plan:  1. Acute midline low back pain with bilateral sciatica -Neck pain for this patient who lives by himself has continued.  Is not severe but is annoying. -He will start walking every morning after breakfast -We will go ahead and make arrangements for him to see the orthopedic specialist he will take a copy of the x-ray report with him when he goes to see them. -If he gets better he will call back and we will cancel the appointment -He will take the prednisone as directed and if he does not keep better we will insist that he keep his appointment for a visit with orthopedics.  2. Spondylosis of lumbar region without myelopathy or radiculopathy -Take prednisone as directed -Discontinue Aleve, continue Zantac  No orders of the defined types were placed in this encounter.  Patient Instructions  The patient will stop the Aleve He will go ahead and start a course of prednisone for 8 days starting in the morning He will continue with the ranitidine to protect his stomach until he finishes the prednisone taper Start walking at least once daily after breakfast We will go ahead and arrange for him to see the orthopedic and if he is not better he will keep that appointment.  If he gets better with the prednisone by mouth we will call us and we will cancel the appointment with orthopedic surgeon. He will take a copy of the x-ray results with him to see the orthopedic surgeon.  Arrie Senate MD

## 2017-09-15 ENCOUNTER — Other Ambulatory Visit: Payer: Self-pay | Admitting: Family Medicine

## 2017-09-19 ENCOUNTER — Other Ambulatory Visit: Payer: Self-pay | Admitting: Family Medicine

## 2017-10-08 DIAGNOSIS — M545 Low back pain: Secondary | ICD-10-CM | POA: Diagnosis not present

## 2017-10-19 ENCOUNTER — Encounter: Payer: Self-pay | Admitting: Family Medicine

## 2017-10-19 ENCOUNTER — Ambulatory Visit (INDEPENDENT_AMBULATORY_CARE_PROVIDER_SITE_OTHER): Payer: Medicare HMO | Admitting: Family Medicine

## 2017-10-19 VITALS — BP 134/62 | HR 99 | Temp 97.0°F | Ht 71.0 in | Wt 147.0 lb

## 2017-10-19 DIAGNOSIS — D509 Iron deficiency anemia, unspecified: Secondary | ICD-10-CM | POA: Diagnosis not present

## 2017-10-19 DIAGNOSIS — M47816 Spondylosis without myelopathy or radiculopathy, lumbar region: Secondary | ICD-10-CM | POA: Diagnosis not present

## 2017-10-19 DIAGNOSIS — E7800 Pure hypercholesterolemia, unspecified: Secondary | ICD-10-CM | POA: Diagnosis not present

## 2017-10-19 DIAGNOSIS — I700 Atherosclerosis of aorta: Secondary | ICD-10-CM | POA: Diagnosis not present

## 2017-10-19 DIAGNOSIS — C61 Malignant neoplasm of prostate: Secondary | ICD-10-CM

## 2017-10-19 DIAGNOSIS — I10 Essential (primary) hypertension: Secondary | ICD-10-CM

## 2017-10-19 DIAGNOSIS — Z23 Encounter for immunization: Secondary | ICD-10-CM

## 2017-10-19 DIAGNOSIS — M79605 Pain in left leg: Secondary | ICD-10-CM

## 2017-10-19 DIAGNOSIS — E559 Vitamin D deficiency, unspecified: Secondary | ICD-10-CM | POA: Diagnosis not present

## 2017-10-19 DIAGNOSIS — M25552 Pain in left hip: Secondary | ICD-10-CM

## 2017-10-19 DIAGNOSIS — M79604 Pain in right leg: Secondary | ICD-10-CM

## 2017-10-19 DIAGNOSIS — M25551 Pain in right hip: Secondary | ICD-10-CM

## 2017-10-19 NOTE — Patient Instructions (Addendum)
Medicare Annual Wellness Visit  [facility] and the medical providers at [facility] strive to bring you the best medical care.  In doing so we not only want to address your current medical conditions and concerns but also to detect new conditions early and prevent illness, disease and health-related problems.    Medicare offers a yearly Wellness Visit which allows our clinical staff to assess your need for preventative services including immunizations, lifestyle education, counseling to decrease risk of preventable diseases and screening for fall risk and other medical concerns.    This visit is provided free of charge (no copay) for all Medicare recipients. The clinical pharmacists at [facility] have begun to conduct these Wellness Visits which will also include a thorough review of all your medications.    As you primary medical provider recommend that you make an appointment for your Annual Wellness Visit if you have not done so already this year.  You may set up this appointment before you leave today or you may call back ([phone]) and schedule an appointment.  Please make sure when you call that you mention that you are scheduling your Annual Wellness Visit with the clinical pharmacist so that the appointment may be made for the proper length of time.     Continue current medications. Continue good therapeutic lifestyle changes which include good diet and exercise. Fall precautions discussed with patient. If an FOBT was given today- please return it to our front desk. If you are over 39 years old - you may need Prevnar 35 or the adult Pneumonia vaccine.  **Flu shots are available--- please call and schedule a FLU-CLINIC appointment**  After your visit with [place] today you will receive a survey in the mail or online from [employer] regarding your care with [place]. Please take a moment to fill this out. Your feedback is very  important to [place] as you can help [place] better understand your patient needs as well as improve your experience and satisfaction. WE CARE ABOUT YOU!!!   Stay active physically Be careful and avoid falling and avoid climbing as much as possible Drink plenty of fluids and stay well-hydrated Take Tylenol for pain Refer to Dr. Herma Mering for further evaluation of back pain and management Flu shot that you received today may make your arm sore

## 2017-10-19 NOTE — Progress Notes (Signed)
Subjective:    [name], male    DOB: [birth year], 84 y.o.   MRN: [id number]  HPI Pt here for follow up and management of chronic medical problems which includes hypertension and hyperlipidemia. He is taking medication regularly.  The patient is here for his regular 84-monthcheckup.  He has hypertension hyperlipidemia and anemia.  He complains with leg pain from his back and recently saw the orthopedic surgeon here in the office.  He also plans to get his flu shot today.  The patient did receive a shot from Dr. GGladstone Lighter  The patient says this helped for a few days but the leg pain has come back.  We will go ahead and do [place]  The patient today is pleasant and alert and denies any chest pain pressure tightness or shortness of breath.  He denies any trouble with his stomach including nausea vomiting diarrhea blood in the stool or change in bowel habits.  He does take iron and his stools are dark in color.  He is passing his water without problems and no longer sees a urologist because of the past prostate cancer.  The patient is alert and appears much younger than his stated age.    Patient Active Problem List   Diagnosis Date Noted  . LBBB (left bundle branch block) 09/05/2016  . Preop cardiovascular exam 09/05/2016  . [place] 08/22/[place]  . Vitamin D deficiency 02/21/2015  . Elevated serum creatinine 08/15/2014  . Thoracic aortic atherosclerosis ([place] 02/07/2014  . History of prostate cancer 09/06/2012  . Essential hypertension 01/20/2012  . Hyperlipidemia 01/20/2012  . Cardiovascular risk factor 01/20/2012  . PVC's (premature ventricular contractions) 01/20/2012   Outpatient Encounter Medications as of 10/19/2017  Medication Sig  . amLODipine (NORVASC) 5 MG tablet TAKE 1 TABLET BY MOUTH EVERY DAY  . aspirin 81 MG tablet Take 81 mg by mouth. Monday, Wednesday, Friday  . benazepril (LOTENSIN) 20 MG tablet  TAKE 1 TABLET EVERY DAY  . Cholecalciferol 1000 UNITS capsule Take 1,000 Units by mouth daily.  .Marland KitchenFeFum-FePoly-FA-B Cmp-C-Biot (INTEGRA PLUS) CAPS TAKE 1 CAPSULE BY MOUTH EVERY DAY  . fenofibrate 54 MG tablet TAKE 1 TABLET BY MOUTH EVERY DAY  . fish oil-omega-3 fatty acids 1000 MG capsule Take 1 g by mouth daily.  .Marland Kitchenglucose blood test strip Check BS QD and PRN  . hydrochlorothiazide (HYDRODIURIL) 25 MG tablet TAKE 1 TABLET BY MOUTH EVERY DAY  . rosuvastatin (CRESTOR) 5 MG tablet TAKE 1 TABLET BY MOUTH EVERY DAY IN THE EVENING  . vitamin C (ASCORBIC ACID) 500 MG tablet Take 500 mg by mouth daily.  . [DISCONTINUED] predniSONE (DELTASONE) 10 MG tablet Take 1 tab QID x 2 days, then 1 tab TID x 2 days, then 1 tab BID x 2 days, then 1 tab QD x 2 days.   No facility-administered encounter medications on file as of 10/19/2017.       Review of Systems  Constitutional: Negative.   HENT: Negative.   Eyes: Negative.   Respiratory: Negative.   Cardiovascular: Negative.   Gastrointestinal: Negative.   Endocrine: Negative.   Genitourinary: Negative.   Musculoskeletal: Positive for back pain (with some leg pain ).  Skin: Negative.   Allergic/Immunologic: Negative.   Neurological: Negative.   Hematological: Negative.   Psychiatric/Behavioral: Negative.        Objective:   Physical Exam  Constitutional: He is oriented to person,  place, and time. He appears well-developed and well-nourished. No distress.  The patient is pleasant and alert and indicates how much problem his legs are giving him when he tries to walk and move.  He looks much younger than his stated age of 84 years.  HENT:  Head: Normocephalic and atraumatic.  Right Ear: External ear normal.  Left Ear: External ear normal.  Nose: Nose normal.  Mouth/Throat: Oropharynx is clear and moist. No oropharyngeal exudate.  Eyes: Pupils are equal, round, and reactive to light. Conjunctivae and EOM are normal. Right eye exhibits no  discharge. Left eye exhibits no discharge. No scleral icterus.  Neck: Normal range of motion. Neck supple. No thyromegaly present.  No bruits thyromegaly or anterior cervical adenopathy  Cardiovascular: Normal rate, regular rhythm, normal heart sounds and intact distal pulses.  No murmur heard. The heart is regular at 72/min without murmurs.  Patient has good circulation in both legs.  Pulmonary/Chest: Effort normal and breath sounds normal. He has no wheezes. He has no rales.  Clear anteriorly and posteriorly no axillary adenopathy  Abdominal: Soft. Bowel sounds are normal. He exhibits no mass. There is no tenderness. There is no rebound and no guarding.  No liver or spleen enlargement no epigastric or suprapubic tenderness and good femoral pulses bilaterally  Musculoskeletal: Normal range of motion. He exhibits no edema, tenderness or deformity.  Leg raising is good bilaterally leg raising is good against pressure without pain in the back or legs.  Lymphadenopathy:    He has no cervical adenopathy.  Neurological: He is alert and oriented to person, place, and time. He has normal reflexes.  Reflexes in the right lower extremity or slightly decreased compared to the left.  Skin: Skin is warm and dry. No rash noted.  Psychiatric: He has a normal mood and affect. His behavior is normal. Judgment and thought content normal.  The patient's mood affect and behavior are all normal for him.  Nursing note and vitals reviewed.  BP 134/62 (BP Location: Left Arm)   Pulse 99   Temp (!) 97 F (36.1 C) (Oral)   Ht '5\' 11"'  (1.803 m)   Wt 147 lb (66.7 kg)   BMI 20.50 kg/m         Assessment & Plan:  1. Pure hypercholesterolemia -Continue aggressive therapeutic lifestyle changes and current treatment pending results of lab work - CBC with Differential/Platelet - Lipid panel  2. Essential hypertension -The blood pressure is good today and he will continue with current treatment - BMP8+EGFR -  CBC with Differential/Platelet - Hepatic function panel  3. Prostate cancer [facility]) -Patient no longer follows up with urologist - CBC with Differential/Platelet  4. Vitamin D deficiency -Continue vitamin D replacement pending results of lab work - CBC with Differential/Platelet - VITAMIN D 25 Hydroxy (Vit-D Deficiency, Fractures)  5. Thoracic aortic atherosclerosis (HCC) -Continue statin drug and as aggressive therapeutic lifestyle changes as possible - CBC with Differential/Platelet - Lipid panel  6. Bilateral hip pain -Most likely coming from his spine and this pain gets better later in the day but it is affecting his lifestyle as an independent person his wife has passed away.  7. Bilateral leg pain -The patient continues to complain mostly with bilateral leg pain and did get better following recent injection by the orthopedic surgeon but the leg pain has returned.  We will do a referral to Dr. Herma Mering for further evaluation and management.  8.  Iron deficiency anemia -Continue with oral iron Monday  through Friday  No orders of the defined types were placed in this encounter.  Patient Instructions                       Medicare Annual Wellness Visit  [facility] and the medical providers at [facility] strive to bring you the best medical care.  In doing so we not only want to address your current medical conditions and concerns but also to detect new conditions early and prevent illness, disease and health-related problems.    Medicare offers a yearly Wellness Visit which allows our clinical staff to assess your need for preventative services including immunizations, lifestyle education, counseling to decrease risk of preventable diseases and screening for fall risk and other medical concerns.    This visit is provided free of charge (no copay) for all Medicare recipients. The clinical pharmacists at [facility] have begun to conduct  these Wellness Visits which will also include a thorough review of all your medications.    As you primary medical provider recommend that you make an appointment for your Annual Wellness Visit if you have not done so already this year.  You may set up this appointment before you leave today or you may call back ([phone]) and schedule an appointment.  Please make sure when you call that you mention that you are scheduling your Annual Wellness Visit with the clinical pharmacist so that the appointment may be made for the proper length of time.     Continue current medications. Continue good therapeutic lifestyle changes which include good diet and exercise. Fall precautions discussed with patient. If an FOBT was given today- please return it to our front desk. If you are over 60 years old - you may need Prevnar 98 or the adult Pneumonia vaccine.  **Flu shots are available--- please call and schedule a FLU-CLINIC appointment**  After your visit with [place] today you will receive a survey in the mail or online from [employer] regarding your care with [place]. Please take a moment to fill this out. Your feedback is very important to [place] as you can help [place] better understand your patient needs as well as improve your experience and satisfaction. WE CARE ABOUT YOU!!!   Stay active physically Be careful and avoid falling and avoid climbing as much as possible Drink plenty of fluids and stay well-hydrated Take Tylenol for pain Refer to Dr. Herma Mering for further evaluation of back pain and management Flu shot that you received today may make your arm sore   Arrie Senate MD

## 2017-10-20 LAB — CBC WITH DIFFERENTIAL/PLATELET
BASOS: 0 %
Basophils Absolute: 0 10*3/uL (ref 0.0–0.2)
EOS (ABSOLUTE): 0.1 10*3/uL (ref 0.0–0.4)
EOS: 1 %
Hematocrit: 36 % — ABNORMAL LOW (ref 37.5–51.0)
Hemoglobin: 12 g/dL — ABNORMAL LOW (ref 13.0–17.7)
IMMATURE GRANS (ABS): 0.1 10*3/uL (ref 0.0–0.1)
IMMATURE GRANULOCYTES: 1 %
LYMPHS: 11 %
Lymphocytes Absolute: 1.2 10*3/uL (ref 0.7–3.1)
MCH: 31.6 pg (ref 26.6–33.0)
MCHC: 33.3 g/dL (ref 31.5–35.7)
MCV: 95 fL (ref 79–97)
Monocytes Absolute: 0.6 10*3/uL (ref 0.1–0.9)
Monocytes: 5 %
Neutrophils Absolute: 8.8 10*3/uL — ABNORMAL HIGH (ref 1.4–7.0)
Neutrophils: 82 %
Platelets: 333 10*3/uL (ref 150–450)
RBC: 3.8 x10E6/uL — ABNORMAL LOW (ref 4.14–5.80)
RDW: 12.6 % (ref 12.3–15.4)
WBC: 10.8 10*3/uL (ref 3.4–10.8)

## 2017-10-20 LAB — BMP8+EGFR
BUN/Creatinine Ratio: 16 (ref 10–24)
BUN: 19 mg/dL (ref 8–27)
CALCIUM: 9.7 mg/dL (ref 8.6–10.2)
CO2: 24 mmol/L (ref 20–29)
Chloride: 104 mmol/L (ref 96–106)
Creatinine, Ser: 1.17 mg/dL (ref 0.76–1.27)
GFR, EST AFRICAN AMERICAN: 64 mL/min/{1.73_m2} (ref >59)
GFR, EST NON AFRICAN AMERICAN: 56 mL/min/{1.73_m2} — AB (ref >59)
Glucose: 91 mg/dL (ref 65–99)
POTASSIUM: 3.6 mmol/L (ref 3.5–5.2)
SODIUM: 143 mmol/L (ref 134–144)

## 2017-10-20 LAB — HEPATIC FUNCTION PANEL
ALK PHOS: 47 IU/L (ref 39–117)
ALT: 18 IU/L (ref 0–44)
AST: 17 IU/L (ref 0–40)
Albumin: 4.2 g/dL (ref 3.5–4.7)
BILIRUBIN TOTAL: 0.4 mg/dL (ref 0.0–1.2)
Bilirubin, Direct: 0.11 mg/dL (ref 0.00–0.40)
Total Protein: 6.2 g/dL (ref 6.0–8.5)

## 2017-10-20 LAB — LIPID PANEL
CHOLESTEROL TOTAL: 146 mg/dL (ref 100–199)
Chol/HDL Ratio: 2.6 ratio (ref 0.0–5.0)
HDL: 56 mg/dL (ref >39)
LDL Calculated: 59 mg/dL (ref 0–99)
TRIGLYCERIDES: 155 mg/dL — AB (ref 0–149)
VLDL CHOLESTEROL CAL: 31 mg/dL (ref 5–40)

## 2017-10-20 LAB — VITAMIN D 25 HYDROXY (VIT D DEFICIENCY, FRACTURES): Vit D, 25-Hydroxy: 41.7 ng/mL (ref 30.0–100.0)

## 2017-10-21 DIAGNOSIS — M51379 Other intervertebral disc degeneration, lumbosacral region without mention of lumbar back pain or lower extremity pain: Secondary | ICD-10-CM | POA: Insufficient documentation

## 2017-10-21 DIAGNOSIS — M5137 Other intervertebral disc degeneration, lumbosacral region: Secondary | ICD-10-CM | POA: Insufficient documentation

## 2017-11-06 DIAGNOSIS — M5137 Other intervertebral disc degeneration, lumbosacral region: Secondary | ICD-10-CM | POA: Diagnosis not present

## 2017-11-17 ENCOUNTER — Other Ambulatory Visit: Payer: Self-pay | Admitting: Family Medicine

## 2017-11-22 ENCOUNTER — Other Ambulatory Visit: Payer: Self-pay | Admitting: Family Medicine

## 2017-11-30 ENCOUNTER — Ambulatory Visit: Payer: Medicare HMO | Admitting: *Deleted

## 2017-11-30 VITALS — BP 142/72 | HR 82

## 2017-11-30 DIAGNOSIS — Z0130 Encounter for examination of blood pressure without abnormal findings: Secondary | ICD-10-CM

## 2017-11-30 NOTE — Progress Notes (Signed)
Pt here for BP check BP 142 72 P 82

## 2017-12-14 ENCOUNTER — Ambulatory Visit (INDEPENDENT_AMBULATORY_CARE_PROVIDER_SITE_OTHER): Payer: Medicare HMO | Admitting: *Deleted

## 2017-12-14 VITALS — BP 136/68 | HR 73

## 2017-12-14 DIAGNOSIS — I10 Essential (primary) hypertension: Secondary | ICD-10-CM

## 2017-12-14 NOTE — Progress Notes (Signed)
Benjamin Buchanan looks good thanks

## 2017-12-14 NOTE — Patient Instructions (Signed)

## 2017-12-14 NOTE — Progress Notes (Signed)
Patient came in today for a bp follow up. Patients BP 136/68   Pulse 73

## 2017-12-17 ENCOUNTER — Other Ambulatory Visit: Payer: Self-pay | Admitting: Family Medicine

## 2017-12-17 DIAGNOSIS — M5137 Other intervertebral disc degeneration, lumbosacral region: Secondary | ICD-10-CM | POA: Diagnosis not present

## 2017-12-22 ENCOUNTER — Other Ambulatory Visit: Payer: Self-pay | Admitting: Orthopedic Surgery

## 2017-12-22 DIAGNOSIS — M5137 Other intervertebral disc degeneration, lumbosacral region: Secondary | ICD-10-CM

## 2017-12-23 ENCOUNTER — Telehealth: Payer: Self-pay | Admitting: Nurse Practitioner

## 2017-12-23 NOTE — Telephone Encounter (Signed)
Phone call to patient to verify medication list and allergies for myelogram procedure. Pt aware he will not need to hold any medications for this procedure.  

## 2018-01-01 ENCOUNTER — Ambulatory Visit
Admission: RE | Admit: 2018-01-01 | Discharge: 2018-01-01 | Disposition: A | Discharge status: Home / Self Care | Payer: Medicare HMO | Source: Ambulatory Visit | Attending: Orthopedic Surgery | Admitting: Orthopedic Surgery

## 2018-01-01 DIAGNOSIS — M5137 Other intervertebral disc degeneration, lumbosacral region: Secondary | ICD-10-CM

## 2018-01-01 DIAGNOSIS — M48061 Spinal stenosis, lumbar region without neurogenic claudication: Secondary | ICD-10-CM | POA: Diagnosis not present

## 2018-01-01 MED ORDER — IOPAMIDOL (ISOVUE-M 200) INJECTION 41%
15.0000 mL | Freq: Once | INTRAMUSCULAR | Status: AC
  Administered 2018-01-01: 15 mL via INTRATHECAL

## 2018-01-01 MED ORDER — ONDANSETRON HCL 4 MG/2ML IJ SOLN
4.0000 mg | Freq: Once | INTRAMUSCULAR | Status: AC
  Administered 2018-01-01: 4 mg via INTRAMUSCULAR

## 2018-01-01 MED ORDER — DIAZEPAM 5 MG PO TABS
5.0000 mg | ORAL_TABLET | Freq: Once | ORAL | Status: AC
  Administered 2018-01-01: 5 mg via ORAL

## 2018-01-01 MED ORDER — MEPERIDINE HCL 100 MG/ML IJ SOLN
75.0000 mg | Freq: Once | INTRAMUSCULAR | Status: AC
  Administered 2018-01-01: 75 mg via INTRAMUSCULAR

## 2018-01-01 NOTE — Discharge Instructions (Signed)

## 2018-01-14 DIAGNOSIS — M5137 Other intervertebral disc degeneration, lumbosacral region: Secondary | ICD-10-CM | POA: Diagnosis not present

## 2018-01-18 ENCOUNTER — Telehealth: Payer: Self-pay | Admitting: Physician Assistant

## 2018-01-18 NOTE — Telephone Encounter (Signed)
Received records from [facility] on 01/18/18, Appt 02/03/18 @ 9:00AM.[place]

## 2018-02-03 ENCOUNTER — Ambulatory Visit: Payer: Medicare HMO | Admitting: Physician Assistant

## 2018-02-03 ENCOUNTER — Encounter: Payer: Self-pay | Admitting: Physician Assistant

## 2018-02-03 ENCOUNTER — Encounter (HOSPITAL_COMMUNITY): Payer: Self-pay | Admitting: *Deleted

## 2018-02-03 VITALS — BP 136/82 | HR 98 | Ht 71.0 in | Wt 153.0 lb

## 2018-02-03 DIAGNOSIS — I10 Essential (primary) hypertension: Secondary | ICD-10-CM

## 2018-02-03 DIAGNOSIS — I493 Ventricular premature depolarization: Secondary | ICD-10-CM | POA: Diagnosis not present

## 2018-02-03 DIAGNOSIS — E785 Hyperlipidemia, unspecified: Secondary | ICD-10-CM

## 2018-02-03 DIAGNOSIS — Z01818 Encounter for other preprocedural examination: Secondary | ICD-10-CM

## 2018-02-03 NOTE — Progress Notes (Signed)
Cardiology Office Note    Date:  02/05/2018   ID:  Benjamin Buchanan, DOB 1928-04-25, MRN [id number]  PCP:  Chipper Herb, MD  Cardiologist:  Dr. Debara Pickett  Chief Complaint  Patient presents with  . Pre-op Exam    requested by Dr. Gladstone Lighter for lumbar surgery.     History of Present Illness:  Benjamin Buchanan is a 85 y.o. male with PMH of HTN, HLD and PVCs.  He has known no coronary artery disease.  He was seen by Dr. Debara Pickett for preoperative clearance prior to inguinal hernia repair.  EKG showed left bundle branch block which was a new finding compared to 2016.  As part of his preoperative evaluation, he underwent stress test on 09/09/2016 which showed EF 53%, medium defect of severe severity present in the mid inferoseptal, apical septal, apical inferior and apex location consistent with prior MI, no significant ischemia was noted.  This was managed medically.  Patient was placed on aspirin 3 times weekly dosing.  Patient presents today for preoperative clearance.  He has severe lumbar stenosis based on the recent CT scan.  On further discussion, he denies any recent chest pain or shortness of breath.  His functional ability is limited by the lumbar stenosis and the significant back pain.  He has about 8 steps in the back of his house leading to the garage.  He climbs up and down the steps on a daily basis without any exertional chest discomfort or shortness of breath.  I discussed the case with Dr. Buford Dresser, he is cleared to proceed with surgery without any additional work-up.  On EKG today, it does show that he has left bundle branch block which is known since 2018, first-degree AV block and also PACs.  Given the first-degree AV block and the left bundle branch block, I did not start him on a beta-blocker as I fear it may worsen the underlying conduction disease.   Past Medical History:  Diagnosis Date  . Anemia   . History of prostate cancer 1994  . HLD (hyperlipidemia)     . HTN (hypertension)   . PVC (premature ventricular contraction)    history of    Past Surgical History:  Procedure Laterality Date  . INGUINAL HERNIA REPAIR Left 10/23/2016   Procedure: LEFT INGUINAL HERNIA REPAIR WITH MESH  ;  Surgeon: Rolm Bookbinder, MD;  Location: [facility];  Service: General;  Laterality: Left;  . INSERTION OF MESH Left 10/23/2016   Procedure: INSERTION OF MESH;  Surgeon: Rolm Bookbinder, MD;  Location: [facility];  Service: General;  Laterality: Left;  . PROSTATE SURGERY      Current Medications: Outpatient Medications Prior to Visit  Medication Sig Dispense Refill  . amLODipine (NORVASC) 5 MG tablet TAKE 1 TABLET BY MOUTH EVERY DAY (Patient taking differently: Take 5 mg by mouth at bedtime. ) 90 tablet 1  . aspirin 81 MG tablet Take 81 mg by mouth every Monday, Wednesday, and Friday.     . benazepril (LOTENSIN) 20 MG tablet TAKE 1 TABLET EVERY DAY (Patient taking differently: Take 20 mg by mouth at bedtime. ) 90 tablet 1  . Cholecalciferol 1000 UNITS capsule Take 1,000 Units by mouth daily.    Marland Kitchen FeFum-FePoly-FA-B Cmp-C-Biot (INTEGRA PLUS) CAPS TAKE 1 CAPSULE BY MOUTH EVERY DAY (Patient taking differently: Take 1 capsule by mouth at bedtime. ) 90 capsule 1  . fenofibrate 54 MG tablet TAKE 1 TABLET BY MOUTH EVERY DAY  90 tablet 1  . fish oil-omega-3 fatty acids 1000 MG capsule Take 1 g by mouth daily.    Marland Kitchen glucose blood test strip Check BS QD and PRN 100 each 11  . hydrochlorothiazide (HYDRODIURIL) 25 MG tablet TAKE 1 TABLET BY MOUTH EVERY DAY 90 tablet 0  . rosuvastatin (CRESTOR) 5 MG tablet Take 5 mg by mouth every Monday, Wednesday, and Friday.     . vitamin C (ASCORBIC ACID) 500 MG tablet Take 500 mg by mouth 2 (two) times daily.     . rosuvastatin (CRESTOR) 5 MG tablet TAKE 1 TABLET BY MOUTH EVERY DAY IN THE EVENING (Patient not taking: Reported on 02/03/2018) 30 tablet 5   No facility-administered medications prior to  visit.      Allergies:   Lipitor [atorvastatin]   Social History   Socioeconomic History  . Marital status: Widowed    Spouse name: Not on file  . Number of children: 2  . Years of education: Not on file  . Highest education level: Not on file  Occupational History  . Not on file  Social Needs  . Financial resource strain: Not on file  . Food insecurity:    Worry: Not on file    Inability: Not on file  . Transportation needs:    Medical: Not on file    Non-medical: Not on file  Tobacco Use  . Smoking status: Never Smoker  . Smokeless tobacco: Never Used  Substance and Sexual Activity  . Alcohol use: No  . Drug use: No  . Sexual activity: Never  Lifestyle  . Physical activity:    Days per week: Not on file    Minutes per session: Not on file  . Stress: Not on file  Relationships  . Social connections:    Talks on phone: Not on file    Gets together: Not on file    Attends religious service: Not on file    Active member of club or organization: Not on file    Attends meetings of clubs or organizations: Not on file    Relationship status: Not on file  Other Topics Concern  . Not on file  Social History Narrative   Retired      Family History:  The patient's family history includes Cancer in his father; Hypertension in an other family member.   ROS:   Please see the history of present illness.    ROS All other systems reviewed and are negative.   PHYSICAL EXAM:   VS:  BP 136/82   Pulse 98   Ht 5\' 11"  (1.803 m)   Wt 153 lb (69.4 kg)   BMI 21.34 kg/m    GEN: Well nourished, well developed, in no acute distress  HEENT: normal  Neck: no JVD, carotid bruits, or masses Cardiac: RRR; no murmurs, rubs, or gallops,no edema  Respiratory:  clear to auscultation bilaterally, normal work of breathing GI: soft, nontender, nondistended, + BS MS: no deformity or atrophy  Skin: warm and dry, no rash Neuro:  Alert and Oriented x 3, Strength and sensation are  intact Psych: euthymic mood, full affect  Wt Readings from Last 3 Encounters:  02/03/18 153 lb (69.4 kg)  10/19/17 147 lb (66.7 kg)  09/07/17 150 lb (68 kg)      Studies/Labs Reviewed:   EKG:  EKG is ordered today.  The ekg ordered today demonstrates normal sinus rhythm, left bundle branch block with PACs.  Recent Labs: 10/19/2017: ALT 18; BUN 19;  Creatinine, Ser 1.17; Hemoglobin 12.0; Platelets 333; Potassium 3.6; Sodium 143   Lipid Panel    Component Value Date/Time   CHOL 146 10/19/2017 0827   TRIG 155 (H) 10/19/2017 0827   TRIG 93 02/15/2015 0828   HDL 56 10/19/2017 0827   HDL 55 02/15/2015 0828   CHOLHDL 2.6 10/19/2017 0827   LDLCALC 59 10/19/2017 0827   LDLCALC 90 07/18/2013 0806    Additional studies/ records that were reviewed today include:   Myoview 09/09/2016 Study Highlights     The left ventricular ejection fraction is mildly decreased (45-54%).  Nuclear stress EF: 53%. There is apical hypokinesis  There was no ST segment deviation noted during stress.  Defect 1: There is a medium defect of severe severity present in the mid inferoseptal, apical septal, apical inferior and apex location.  Findings consistent with prior myocardial infarction.  This is an intermediate risk study based upon prior old infarction. There is no significant ischemia identified.       ASSESSMENT:    1. Preoperative clearance   2. Essential hypertension   3. Hyperlipidemia, unspecified hyperlipidemia type   4. PVC's (premature ventricular contractions)      PLAN:  In order of problems listed above:  1. Preoperative clearance: Although patient does not have any formal diagnosis of coronary artery disease in the past, however he has presumed coronary artery disease as previous stress test in August 2018 showed a medium defect of severe severity present in the mid inferoseptal, apical septal, apical inferior and apex location consistent with prior MI, there was no  significant ischemia identified, therefore he was managed medically.  He has poor functional ability due to back issue, however still able to climb up and down the stairs in the back of his house without any exertional symptoms.  I discussed the case with Dr. Buford Dresser who agrees that patient is cleared to proceed with surgery without any further work-up.  2. Hypertension: Blood pressure well controlled on current therapy  3. Hyperlipidemia: Continue fenofibrate and fish oil along with Crestor.  4. PVCs: We avoided beta-blocker in this patient mainly because prior history of conduction disease which may be worsened with any kind of rate control.    Medication Adjustments/Labs and Tests Ordered: Current medicines are reviewed at length with the patient today.  Concerns regarding medicines are outlined above.  Medication changes, Labs and Tests ordered today are listed in the Patient Instructions below. Patient Instructions  Medication Instructions:  The current medical regimen is effective;  continue present plan and medications.  If you need a refill on your cardiac medications before your next appointment, please call your pharmacy.    Follow-Up: At [facility], you and your health needs are our priority.  As part of our continuing mission to provide you with exceptional heart care, we have created designated Provider Care Teams.  These Care Teams include your primary Cardiologist (physician) and Advanced Practice Providers (APPs -  Physician Assistants and Nurse Practitioners) who all work together to provide you with the care you need, when you need it. You will need a follow up appointment in 6 months.  Please call our office 2 months in advance to schedule this appointment.  You may see Dr.Hilty or one of the following Advanced Practice Providers on your designated Care Team: Almyra Deforest, [place] . Fabian Sharp, PA-C        Signed, [place], [place]  02/05/2018 10:58 PM     [facility]  [place], [place], [place]  [address] Phone: [phone]; Fax: [phone]

## 2018-02-03 NOTE — Patient Instructions (Signed)
Medication Instructions:  The current medical regimen is effective;  continue present plan and medications.  If you need a refill on your cardiac medications before your next appointment, please call your pharmacy.    Follow-Up: At [facility], you and your health needs are our priority.  As part of our continuing mission to provide you with exceptional heart care, we have created designated Provider Care Teams.  These Care Teams include your primary Cardiologist (physician) and Advanced Practice Providers (APPs -  Physician Assistants and Nurse Practitioners) who all work together to provide you with the care you need, when you need it. You will need a follow up appointment in 6 months.  Please call our office 2 months in advance to schedule this appointment.  You may see Dr.Hilty or one of the following Advanced Practice Providers on your designated Care Team: Almyra Deforest, [place] . Fabian Sharp, PA-C

## 2018-02-04 NOTE — Progress Notes (Signed)
02/02/2018- noted in Epic-EKG  09/09/2016- noted in Epic-Stress test

## 2018-02-04 NOTE — Patient Instructions (Signed)
Benjamin Buchanan  02/04/2018   Your procedure is scheduled on: Wednesday 02/10/2018  Report to [facility] Main  Entrance              Report to admitting at   0530  AM    Call this number if you have problems the morning of surgery [phone]    Remember: Do not eat food or drink liquids :After Midnight.             BRUSH YOUR TEETH MORNING OF SURGERY AND RINSE YOUR MOUTH OUT, NO CHEWING GUM CANDY OR MINTS.     Take these medicines the morning of surgery with A SIP OF WATER: Rosuvastatin (Crestor)                                You may not have any metal on your body including hair pins and              piercings  Do not wear jewelry, make-up, lotions, powders or perfumes, deodorant                        Men may shave face and neck.   Do not bring valuables to the hospital. [place].  Contacts, dentures or bridgework may not be worn into surgery.  Leave suitcase in the car. After surgery it may be brought to your room.              Please read over the following fact sheets you were given: _____________________________________________________________________             [facility] - Preparing for Surgery Before surgery, you can play an important role.  Because skin is not sterile, your skin needs to be as free of germs as possible.  You can reduce the number of germs on your skin by washing with CHG (chlorahexidine gluconate) soap before surgery.  CHG is an antiseptic cleaner which kills germs and bonds with the skin to continue killing germs even after washing. Please DO NOT use if you have an allergy to CHG or antibacterial soaps.  If your skin becomes reddened/irritated stop using the CHG and inform your nurse when you arrive at Short Stay. Do not shave (including legs and underarms) for at least 48 hours prior to the first CHG shower.  You may shave your face/neck. Please follow these instructions  carefully:  1.  Shower with CHG Soap the night before surgery and the  morning of Surgery.  2.  If you choose to wash your hair, wash your hair first as usual with your  normal  shampoo.  3.  After you shampoo, rinse your hair and body thoroughly to remove the  shampoo.                           4.  Use CHG as you would any other liquid soap.  You can apply chg directly  to the skin and wash                       Gently with a scrungie or clean washcloth.  5.  Apply the CHG  Soap to your body ONLY FROM THE NECK DOWN.   Do not use on face/ open                           Wound or open sores. Avoid contact with eyes, ears mouth and genitals (private parts).                       Wash face,  Genitals (private parts) with your normal soap.             6.  Wash thoroughly, paying special attention to the area where your surgery  will be performed.  7.  Thoroughly rinse your body with warm water from the neck down.  8.  DO NOT shower/wash with your normal soap after using and rinsing off  the CHG Soap.                9.  Pat yourself dry with a clean towel.            10.  Wear clean pajamas.            11.  Place clean sheets on your bed the night of your first shower and do not  sleep with pets. Day of Surgery : Do not apply any lotions/deodorants the morning of surgery.  Please wear clean clothes to the hospital/surgery center.  FAILURE TO FOLLOW THESE INSTRUCTIONS MAY RESULT IN THE CANCELLATION OF YOUR SURGERY PATIENT SIGNATURE_________________________________  NURSE SIGNATURE__________________________________  ________________________________________________________________________   Adam Phenix  An incentive spirometer is a tool that can help keep your lungs clear and active. This tool measures how well you are filling your lungs with each breath. Taking long deep breaths may help reverse or decrease the chance of developing breathing (pulmonary) problems (especially infection)  following:  A long period of time when you are unable to move or be active. BEFORE THE PROCEDURE   If the spirometer includes an indicator to show your best effort, your nurse or respiratory therapist will set it to a desired goal.  If possible, sit up straight or lean slightly forward. Try not to slouch.  Hold the incentive spirometer in an upright position. INSTRUCTIONS FOR USE  1. Sit on the edge of your bed if possible, or sit up as far as you can in bed or on a chair. 2. Hold the incentive spirometer in an upright position. 3. Breathe out normally. 4. Place the mouthpiece in your mouth and seal your lips tightly around it. 5. Breathe in slowly and as deeply as possible, raising the piston or the ball toward the top of the column. 6. Hold your breath for 3-5 seconds or for as long as possible. Allow the piston or ball to fall to the bottom of the column. 7. Remove the mouthpiece from your mouth and breathe out normally. 8. Rest for a few seconds and repeat Steps 1 through 7 at least 10 times every 1-2 hours when you are awake. Take your time and take a few normal breaths between deep breaths. 9. The spirometer may include an indicator to show your best effort. Use the indicator as a goal to work toward during each repetition. 10. After each set of 10 deep breaths, practice coughing to be sure your lungs are clear. If you have an incision (the cut made at the time of surgery), support your incision when coughing by placing a pillow or rolled up towels  firmly against it. Once you are able to get out of bed, walk around indoors and cough well. You may stop using the incentive spirometer when instructed by your caregiver.  RISKS AND COMPLICATIONS  Take your time so you do not get dizzy or light-headed.  If you are in pain, you may need to take or ask for pain medication before doing incentive spirometry. It is harder to take a deep breath if you are having pain. AFTER USE  Rest and  breathe slowly and easily.  It can be helpful to keep track of a log of your progress. Your caregiver can provide you with a simple table to help with this. If you are using the spirometer at home, follow these instructions: [place] IF:   You are having difficultly using the spirometer.  You have trouble using the spirometer as often as instructed.  Your pain medication is not giving enough relief while using the spirometer.  You develop fever of 100.5 F (38.1 C) or higher. SEEK IMMEDIATE MEDICAL CARE IF:   You cough up bloody sputum that had not been present before.  You develop fever of 102 F (38.9 C) or greater.  You develop worsening pain at or near the incision site. MAKE SURE YOU:   Understand these instructions.  Will watch your condition.  Will get help right away if you are not doing well or get worse. Document Released: 05/12/2006 Document Revised: 03/24/2011 Document Reviewed: 07/13/2006 [facility] Patient Information 2014 [place], [place].   ________________________________________________________________________

## 2018-02-05 ENCOUNTER — Encounter: Payer: Self-pay | Admitting: Physician Assistant

## 2018-02-08 ENCOUNTER — Encounter (HOSPITAL_COMMUNITY)
Admission: RE | Admit: 2018-02-08 | Discharge: 2018-02-08 | Disposition: A | Discharge status: Home / Self Care | Payer: Medicare HMO | Source: Ambulatory Visit | Attending: Orthopedic Surgery | Admitting: Orthopedic Surgery

## 2018-02-08 ENCOUNTER — Encounter (HOSPITAL_COMMUNITY): Payer: Self-pay

## 2018-02-08 ENCOUNTER — Other Ambulatory Visit: Payer: Self-pay

## 2018-02-08 ENCOUNTER — Ambulatory Visit (HOSPITAL_COMMUNITY)
Admission: RE | Admit: 2018-02-08 | Discharge: 2018-02-08 | Disposition: A | Discharge status: Home / Self Care | Payer: Medicare HMO | Source: Ambulatory Visit | Attending: Surgical | Admitting: Surgical

## 2018-02-08 DIAGNOSIS — M545 Low back pain, unspecified: Secondary | ICD-10-CM

## 2018-02-08 DIAGNOSIS — M48061 Spinal stenosis, lumbar region without neurogenic claudication: Secondary | ICD-10-CM | POA: Diagnosis not present

## 2018-02-08 LAB — CBC WITH DIFFERENTIAL/PLATELET
Abs Immature Granulocytes: 0.08 10*3/uL — ABNORMAL HIGH (ref 0.00–0.07)
Basophils Absolute: 0.1 10*3/uL (ref 0.0–0.1)
Basophils Relative: 0 %
Eosinophils Absolute: 0.1 10*3/uL (ref 0.0–0.5)
Eosinophils Relative: 1 %
HCT: 38.8 % — ABNORMAL LOW (ref 39.0–52.0)
Hemoglobin: 12.5 g/dL — ABNORMAL LOW (ref 13.0–17.0)
Immature Granulocytes: 1 %
Lymphocytes Relative: 9 %
Lymphs Abs: 1.3 10*3/uL (ref 0.7–4.0)
MCH: 31.6 pg (ref 26.0–34.0)
MCHC: 32.2 g/dL (ref 30.0–36.0)
MCV: 98.2 fL (ref 80.0–100.0)
Monocytes Absolute: 0.6 10*3/uL (ref 0.1–1.0)
Monocytes Relative: 5 %
Neutro Abs: 11.4 10*3/uL — ABNORMAL HIGH (ref 1.7–7.7)
Neutrophils Relative %: 84 %
Platelets: 351 10*3/uL (ref 150–400)
RBC: 3.95 MIL/uL — ABNORMAL LOW (ref 4.22–5.81)
RDW: 11.9 % (ref 11.5–15.5)
WBC: 13.5 10*3/uL — ABNORMAL HIGH (ref 4.0–10.5)
nRBC: 0 % (ref 0.0–0.2)

## 2018-02-08 LAB — COMPREHENSIVE METABOLIC PANEL
ALT: 21 U/L (ref 0–44)
AST: 25 U/L (ref 15–41)
Albumin: 4 g/dL (ref 3.5–5.0)
Alkaline Phosphatase: 47 U/L (ref 38–126)
Anion gap: 10 (ref 5–15)
BUN: 21 mg/dL (ref 8–23)
CO2: 25 mmol/L (ref 22–32)
Calcium: 9.8 mg/dL (ref 8.9–10.3)
Chloride: 105 mmol/L (ref 98–111)
Creatinine, Ser: 1.25 mg/dL — ABNORMAL HIGH (ref 0.61–1.24)
GFR calc Af Amer: 60 mL/min — ABNORMAL LOW (ref >60)
GFR calc non Af Amer: 51 mL/min — ABNORMAL LOW (ref >60)
Glucose, Bld: 109 mg/dL — ABNORMAL HIGH (ref 70–99)
Potassium: 3.7 mmol/L (ref 3.5–5.1)
Sodium: 140 mmol/L (ref 135–145)
Total Bilirubin: 0.7 mg/dL (ref 0.3–1.2)
Total Protein: 7 g/dL (ref 6.5–8.1)

## 2018-02-08 LAB — SURGICAL PCR SCREEN
MRSA, PCR: NEGATIVE
Staphylococcus aureus: NEGATIVE

## 2018-02-08 LAB — PROTIME-INR
INR: 1.04
Prothrombin Time: 13.5 seconds (ref 11.4–15.2)

## 2018-02-08 NOTE — Progress Notes (Signed)
Anesthesia Chart Review   Case:  [id number] Date/Time:  02/10/18 0715   Procedure:  Central decompression of L2-3 and L4-5 (N/A ) - 19min   Anesthesia type:  General   Pre-op diagnosis:  lumbar spinal stenosis   Location:  [facility] ROOM 08 / [facility]   Surgeon:  Latanya Maudlin, MD      DISCUSSION: 85 yo never smoker with h/o PVCs, HTN, HLD, prostate cancer, anemia, lumbar stenosis scheduled for above surgery on 02/10/2018 with Dr. Latanya Maudlin.    Pt last seen by cardiology on 02/03/18, seen by Billey Chang, PA-C.  Per his note, "he is cleared to proceed with surgery without any additional work-up.  On EKG today, it does show that he has left bundle branch block which is known since 2018, first-degree AV block and also PACs."  Leukocytosis noted at PST visit on 02/08/2018 and surgeon made aware.    Pt can proceed with planned procedure barring acute status change.  VS: BP (!) 167/88   Pulse 85   Temp 36.8 C (Oral)   Resp 18   Ht 5\' 10"  (1.778 m)   Wt 68.5 kg   SpO2 100%   BMI 21.67 kg/m   PROVIDERS: Chipper Herb, MD is PCP   Andree Elk, MD is Cardiologist LABS: Labs reviewed: Acceptable for surgery. Elevated WBC reviewed with surgeon (all labs ordered are listed, but only abnormal results are displayed)  Labs Reviewed  CBC WITH DIFFERENTIAL/PLATELET - Abnormal; Notable for the following components:      Result Value   WBC 13.5 (*)    RBC 3.95 (*)    Hemoglobin 12.5 (*)    HCT 38.8 (*)    Neutro Abs 11.4 (*)    Abs Immature Granulocytes 0.08 (*)    All other components within normal limits  COMPREHENSIVE METABOLIC PANEL - Abnormal; Notable for the following components:   Glucose, Bld 109 (*)    Creatinine, Ser 1.25 (*)    GFR calc non Af Amer 51 (*)    GFR calc Af Amer 60 (*)    All other components within normal limits  SURGICAL PCR SCREEN  PROTIME-INR     IMAGES:   EKG: 02/03/2018 Rate 98 bpm NSR LBBB PACs  CV: Stress Test 09/09/16  The left  ventricular ejection fraction is mildly decreased (45-54%).  Nuclear stress EF: 53%. There is apical hypokinesis  There was no ST segment deviation noted during stress.  Defect 1: There is a medium defect of severe severity present in the mid inferoseptal, apical septal, apical inferior and apex location.  Findings consistent with prior myocardial infarction.  This is an intermediate risk study based upon prior old infarction. There is no significant ischemia identified. Past Medical History:  Diagnosis Date  . Anemia   . History of prostate cancer 1994  . HLD (hyperlipidemia)   . HTN (hypertension)   . PVC (premature ventricular contraction)    history of    Past Surgical History:  Procedure Laterality Date  . HERNIA REPAIR    . INGUINAL HERNIA REPAIR Left 10/23/2016   Procedure: LEFT INGUINAL HERNIA REPAIR WITH MESH  ;  Surgeon: Rolm Bookbinder, MD;  Location: [place];  Service: General;  Laterality: Left;  . INSERTION OF MESH Left 10/23/2016   Procedure: INSERTION OF MESH;  Surgeon: Rolm Bookbinder, MD;  Location: [place];  Service: General;  Laterality: Left;  . PROSTATE SURGERY      MEDICATIONS: . acetaminophen (  TYLENOL) 325 MG tablet  . amLODipine (NORVASC) 5 MG tablet  . aspirin 81 MG tablet  . benazepril (LOTENSIN) 20 MG tablet  . Cholecalciferol 1000 UNITS capsule  . FeFum-FePoly-FA-B Cmp-C-Biot (INTEGRA PLUS) CAPS  . fenofibrate 54 MG tablet  . fish oil-omega-3 fatty acids 1000 MG capsule  . glucose blood test strip  . hydrochlorothiazide (HYDRODIURIL) 25 MG tablet  . rosuvastatin (CRESTOR) 5 MG tablet  . vitamin C (ASCORBIC ACID) 500 MG tablet   No current facility-administered medications for this encounter.    Maia Plan [facility] Pre-Surgical Testing [phone] 02/08/18 11:37 AM

## 2018-02-08 NOTE — Anesthesia Preprocedure Evaluation (Addendum)
Anesthesia Evaluation  Patient identified by MRN, date of birth, ID band Patient awake    Reviewed: Allergy & Precautions, NPO status , Patient's Chart, lab work & pertinent test results  Airway Mallampati: II  TM Distance: >3 FB Neck ROM: Full    Dental no notable dental hx. (+) Partial Lower   Pulmonary neg pulmonary ROS,    Pulmonary exam normal breath sounds clear to auscultation       Cardiovascular Exercise Tolerance: Good hypertension, Pt. on medications Normal cardiovascular exam Rhythm:Regular Rate:Normal  02/03/2018 EKG sr w LBBB R 98      Neuro/Psych negative neurological ROS     GI/Hepatic negative GI ROS, Neg liver ROS,   Endo/Other    Renal/GU negative Renal ROSCr 1.25   Hx of prostate ca    Musculoskeletal   Abdominal   Peds  Hematology  (+) anemia , Hgb 12.5    Anesthesia Other Findings   Reproductive/Obstetrics                           Anesthesia Physical Anesthesia Plan  ASA: III  Anesthesia Plan: General   Post-op Pain Management:    Induction: Intravenous  PONV Risk Score and Plan: 3 and Treatment may vary due to age or medical condition, Ondansetron and Dexamethasone  Airway Management Planned: Oral ETT  Additional Equipment:   Intra-op Plan:   Post-operative Plan: Extubation in OR  Informed Consent: I have reviewed the patients History and Physical, chart, labs and discussed the procedure including the risks, benefits and alternatives for the proposed anesthesia with the patient or authorized representative who has indicated his/her understanding and acceptance.     Dental advisory given  Plan Discussed with:   Anesthesia Plan Comments: (See PST note 02/08/2018, Konrad Felix, PA-C)       Anesthesia Quick Evaluation

## 2018-02-09 MED ORDER — BUPIVACAINE LIPOSOME 1.3 % IJ SUSP
20.0000 mL | INTRAMUSCULAR | Status: AC
  Filled 2018-02-09: qty 20

## 2018-02-10 ENCOUNTER — Inpatient Hospital Stay (HOSPITAL_COMMUNITY): Payer: Medicare HMO

## 2018-02-10 ENCOUNTER — Encounter (HOSPITAL_COMMUNITY)
Admission: RE | Disposition: A | Discharge status: Home / Self Care | Payer: Self-pay | Source: Home / Self Care | Attending: Orthopedic Surgery

## 2018-02-10 ENCOUNTER — Other Ambulatory Visit: Payer: Self-pay

## 2018-02-10 ENCOUNTER — Inpatient Hospital Stay (HOSPITAL_COMMUNITY): Payer: Medicare HMO | Admitting: Anesthesiology

## 2018-02-10 ENCOUNTER — Encounter (HOSPITAL_COMMUNITY): Payer: Self-pay | Admitting: Emergency Medicine

## 2018-02-10 ENCOUNTER — Observation Stay (HOSPITAL_COMMUNITY)
Admission: RE | Admit: 2018-02-10 | Discharge: 2018-02-11 | Disposition: A | Discharge status: Home / Self Care | Payer: Medicare HMO | Attending: Orthopedic Surgery | Admitting: Orthopedic Surgery

## 2018-02-10 ENCOUNTER — Inpatient Hospital Stay (HOSPITAL_COMMUNITY): Payer: Medicare HMO | Admitting: Physician Assistant

## 2018-02-10 DIAGNOSIS — M48061 Spinal stenosis, lumbar region without neurogenic claudication: Principal | ICD-10-CM | POA: Insufficient documentation

## 2018-02-10 DIAGNOSIS — Z981 Arthrodesis status: Secondary | ICD-10-CM | POA: Diagnosis not present

## 2018-02-10 DIAGNOSIS — M549 Dorsalgia, unspecified: Secondary | ICD-10-CM

## 2018-02-10 DIAGNOSIS — R338 Other retention of urine: Secondary | ICD-10-CM | POA: Diagnosis not present

## 2018-02-10 DIAGNOSIS — E559 Vitamin D deficiency, unspecified: Secondary | ICD-10-CM | POA: Diagnosis not present

## 2018-02-10 DIAGNOSIS — R262 Difficulty in walking, not elsewhere classified: Secondary | ICD-10-CM | POA: Diagnosis not present

## 2018-02-10 DIAGNOSIS — Z79899 Other long term (current) drug therapy: Secondary | ICD-10-CM | POA: Insufficient documentation

## 2018-02-10 DIAGNOSIS — Z8546 Personal history of malignant neoplasm of prostate: Secondary | ICD-10-CM | POA: Diagnosis not present

## 2018-02-10 DIAGNOSIS — Z7982 Long term (current) use of aspirin: Secondary | ICD-10-CM | POA: Diagnosis not present

## 2018-02-10 DIAGNOSIS — I10 Essential (primary) hypertension: Secondary | ICD-10-CM | POA: Diagnosis not present

## 2018-02-10 DIAGNOSIS — E785 Hyperlipidemia, unspecified: Secondary | ICD-10-CM | POA: Insufficient documentation

## 2018-02-10 DIAGNOSIS — Z466 Encounter for fitting and adjustment of urinary device: Secondary | ICD-10-CM | POA: Diagnosis not present

## 2018-02-10 DIAGNOSIS — Z9079 Acquired absence of other genital organ(s): Secondary | ICD-10-CM | POA: Diagnosis not present

## 2018-02-10 HISTORY — PX: LUMBAR LAMINECTOMY/DECOMPRESSION MICRODISCECTOMY: SHX5026

## 2018-02-10 SURGERY — LUMBAR LAMINECTOMY/DECOMPRESSION MICRODISCECTOMY
Anesthesia: General | Site: Back

## 2018-02-10 MED ORDER — BUPIVACAINE-EPINEPHRINE (PF) 0.25% -1:200000 IJ SOLN
INTRAMUSCULAR | Status: AC
  Filled 2018-02-10: qty 30

## 2018-02-10 MED ORDER — ACETAMINOPHEN 325 MG PO TABS
650.0000 mg | ORAL_TABLET | ORAL | Status: DC | PRN
  Administered 2018-02-10 – 2018-02-11 (×2): 650 mg via ORAL
  Filled 2018-02-10 (×2): qty 2

## 2018-02-10 MED ORDER — ONDANSETRON HCL 4 MG/2ML IJ SOLN: 4.0000 mg | Freq: Once | INTRAMUSCULAR | Status: DC | PRN

## 2018-02-10 MED ORDER — SUGAMMADEX SODIUM 200 MG/2ML IV SOLN
INTRAVENOUS | Status: AC
  Filled 2018-02-10: qty 2

## 2018-02-10 MED ORDER — PHENYLEPHRINE 40 MCG/ML (10ML) SYRINGE FOR IV PUSH (FOR BLOOD PRESSURE SUPPORT)
PREFILLED_SYRINGE | INTRAVENOUS | Status: DC | PRN
  Administered 2018-02-10: 80 ug via INTRAVENOUS

## 2018-02-10 MED ORDER — THROMBIN (RECOMBINANT) 5000 UNITS EX SOLR
CUTANEOUS | Status: AC
  Filled 2018-02-10: qty 5000

## 2018-02-10 MED ORDER — TAMSULOSIN HCL 0.4 MG PO CAPS: 0.4000 mg | ORAL_CAPSULE | Freq: Every day | ORAL | 0 refills | Status: DC

## 2018-02-10 MED ORDER — FENTANYL CITRATE (PF) 100 MCG/2ML IJ SOLN
INTRAMUSCULAR | Status: DC | PRN
  Administered 2018-02-10 (×4): 50 ug via INTRAVENOUS

## 2018-02-10 MED ORDER — ONDANSETRON HCL 4 MG PO TABS: 4.0000 mg | ORAL_TABLET | Freq: Four times a day (QID) | ORAL | Status: DC | PRN

## 2018-02-10 MED ORDER — FLEET ENEMA 7-19 GM/118ML RE ENEM: 1.0000 | ENEMA | Freq: Once | RECTAL | Status: DC | PRN

## 2018-02-10 MED ORDER — GABAPENTIN 300 MG PO CAPS
300.0000 mg | ORAL_CAPSULE | Freq: Once | ORAL | Status: AC
  Administered 2018-02-10: 300 mg via ORAL

## 2018-02-10 MED ORDER — BENAZEPRIL HCL 20 MG PO TABS
20.0000 mg | ORAL_TABLET | Freq: Every day | ORAL | Status: DC
  Administered 2018-02-11: 20 mg via ORAL
  Filled 2018-02-10: qty 1

## 2018-02-10 MED ORDER — SODIUM CHLORIDE 0.9 % IV SOLN
INTRAVENOUS | Status: AC
  Filled 2018-02-10: qty 500000

## 2018-02-10 MED ORDER — BUPIVACAINE-EPINEPHRINE (PF) 0.25% -1:200000 IJ SOLN
INTRAMUSCULAR | Status: DC | PRN
  Administered 2018-02-10: 20 mL

## 2018-02-10 MED ORDER — FENTANYL CITRATE (PF) 100 MCG/2ML IJ SOLN: 25.0000 ug | INTRAMUSCULAR | Status: DC | PRN

## 2018-02-10 MED ORDER — BACITRACIN-NEOMYCIN-POLYMYXIN 400-5-5000 EX OINT
TOPICAL_OINTMENT | CUTANEOUS | Status: DC | PRN
  Administered 2018-02-10: 1 via TOPICAL

## 2018-02-10 MED ORDER — PHENYLEPHRINE HCL-NACL 10-0.9 MG/250ML-% IV SOLN
INTRAVENOUS | Status: AC
Start: 2018-02-10 — End: 2018-02-10
  Filled 2018-02-10: qty 250

## 2018-02-10 MED ORDER — THROMBIN 5000 UNITS EX SOLR
CUTANEOUS | Status: DC | PRN
  Administered 2018-02-10: 5000 [IU] via TOPICAL

## 2018-02-10 MED ORDER — ACETAMINOPHEN 650 MG RE SUPP: 650.0000 mg | RECTAL | Status: DC | PRN

## 2018-02-10 MED ORDER — PHENYLEPHRINE 40 MCG/ML (10ML) SYRINGE FOR IV PUSH (FOR BLOOD PRESSURE SUPPORT)
PREFILLED_SYRINGE | INTRAVENOUS | Status: AC
  Filled 2018-02-10: qty 10

## 2018-02-10 MED ORDER — ONDANSETRON HCL 4 MG/2ML IJ SOLN
INTRAMUSCULAR | Status: DC | PRN
  Administered 2018-02-10: 4 mg via INTRAVENOUS

## 2018-02-10 MED ORDER — ONDANSETRON HCL 4 MG/2ML IJ SOLN
INTRAMUSCULAR | Status: AC
  Filled 2018-02-10: qty 2

## 2018-02-10 MED ORDER — METHOCARBAMOL 500 MG IVPB - SIMPLE MED
INTRAVENOUS | Status: AC
  Filled 2018-02-10: qty 50

## 2018-02-10 MED ORDER — SUCCINYLCHOLINE CHLORIDE 200 MG/10ML IV SOSY
PREFILLED_SYRINGE | INTRAVENOUS | Status: AC
  Filled 2018-02-10: qty 10

## 2018-02-10 MED ORDER — PHENOL 1.4 % MT LIQD
1.0000 | OROMUCOSAL | Status: DC | PRN
  Filled 2018-02-10: qty 177

## 2018-02-10 MED ORDER — METHOCARBAMOL 500 MG PO TABS
500.0000 mg | ORAL_TABLET | Freq: Four times a day (QID) | ORAL | Status: DC | PRN
  Administered 2018-02-10 – 2018-02-11 (×2): 500 mg via ORAL
  Filled 2018-02-10 (×2): qty 1

## 2018-02-10 MED ORDER — BACITRACIN-NEOMYCIN-POLYMYXIN 400-5-5000 EX OINT
TOPICAL_OINTMENT | CUTANEOUS | Status: AC
  Filled 2018-02-10: qty 1

## 2018-02-10 MED ORDER — SUGAMMADEX SODIUM 200 MG/2ML IV SOLN
INTRAVENOUS | Status: DC | PRN
  Administered 2018-02-10: 200 mg via INTRAVENOUS

## 2018-02-10 MED ORDER — AMLODIPINE BESYLATE 5 MG PO TABS
5.0000 mg | ORAL_TABLET | Freq: Every day | ORAL | Status: DC
  Administered 2018-02-10: 5 mg via ORAL
  Filled 2018-02-10: qty 1

## 2018-02-10 MED ORDER — FENTANYL CITRATE (PF) 250 MCG/5ML IJ SOLN
INTRAMUSCULAR | Status: AC
  Filled 2018-02-10: qty 5

## 2018-02-10 MED ORDER — ACETAMINOPHEN 10 MG/ML IV SOLN: 1000.0000 mg | Freq: Once | INTRAVENOUS | Status: DC | PRN

## 2018-02-10 MED ORDER — LACTATED RINGERS IV SOLN
INTRAVENOUS | Status: DC
  Administered 2018-02-10 (×2): via INTRAVENOUS

## 2018-02-10 MED ORDER — DEXAMETHASONE SODIUM PHOSPHATE 10 MG/ML IJ SOLN
INTRAMUSCULAR | Status: AC
  Filled 2018-02-10: qty 1

## 2018-02-10 MED ORDER — HYDROMORPHONE HCL 1 MG/ML IJ SOLN: 0.5000 mg | INTRAMUSCULAR | Status: DC | PRN

## 2018-02-10 MED ORDER — ONDANSETRON HCL 4 MG/2ML IJ SOLN: 4.0000 mg | Freq: Four times a day (QID) | INTRAMUSCULAR | Status: DC | PRN

## 2018-02-10 MED ORDER — HYDROCODONE-ACETAMINOPHEN 5-325 MG PO TABS
1.0000 | ORAL_TABLET | ORAL | Status: DC | PRN
  Filled 2018-02-10: qty 1

## 2018-02-10 MED ORDER — CHLORHEXIDINE GLUCONATE 4 % EX LIQD: 60.0000 mL | Freq: Once | CUTANEOUS | Status: DC

## 2018-02-10 MED ORDER — METHOCARBAMOL 500 MG PO TABS: 500.0000 mg | ORAL_TABLET | Freq: Three times a day (TID) | ORAL | 0 refills | Status: DC | PRN

## 2018-02-10 MED ORDER — HYDROCODONE-ACETAMINOPHEN 7.5-325 MG PO TABS: 2.0000 | ORAL_TABLET | ORAL | Status: DC | PRN

## 2018-02-10 MED ORDER — EPHEDRINE SULFATE-NACL 50-0.9 MG/10ML-% IV SOSY
PREFILLED_SYRINGE | INTRAVENOUS | Status: DC | PRN
  Administered 2018-02-10: 5 mg via INTRAVENOUS
  Administered 2018-02-10: 10 mg via INTRAVENOUS

## 2018-02-10 MED ORDER — INSULIN ASPART 100 UNIT/ML ~~LOC~~ SOLN: 0.0000 [IU] | Freq: Three times a day (TID) | SUBCUTANEOUS | Status: DC

## 2018-02-10 MED ORDER — GABAPENTIN 300 MG PO CAPS
ORAL_CAPSULE | ORAL | Status: AC
  Administered 2018-02-10: 300 mg via ORAL
  Filled 2018-02-10: qty 1

## 2018-02-10 MED ORDER — CEFAZOLIN SODIUM-DEXTROSE 2-4 GM/100ML-% IV SOLN
2.0000 g | INTRAVENOUS | Status: AC
  Administered 2018-02-10: 2 g via INTRAVENOUS
  Filled 2018-02-10: qty 100

## 2018-02-10 MED ORDER — BISACODYL 5 MG PO TBEC: 5.0000 mg | DELAYED_RELEASE_TABLET | Freq: Every day | ORAL | Status: DC | PRN

## 2018-02-10 MED ORDER — CEFAZOLIN SODIUM-DEXTROSE 1-4 GM/50ML-% IV SOLN
1.0000 g | Freq: Three times a day (TID) | INTRAVENOUS | Status: AC
  Administered 2018-02-10 – 2018-02-11 (×3): 1 g via INTRAVENOUS
  Filled 2018-02-10 (×3): qty 50

## 2018-02-10 MED ORDER — MENTHOL 3 MG MT LOZG
1.0000 | LOZENGE | OROMUCOSAL | Status: DC | PRN
  Filled 2018-02-10: qty 9

## 2018-02-10 MED ORDER — PHENYLEPHRINE HCL 10 MG/ML IJ SOLN
INTRAVENOUS | Status: DC | PRN
  Administered 2018-02-10: 25 ug/min via INTRAVENOUS

## 2018-02-10 MED ORDER — LACTATED RINGERS IV SOLN
INTRAVENOUS | Status: DC
  Administered 2018-02-10 – 2018-02-11 (×2): via INTRAVENOUS

## 2018-02-10 MED ORDER — SODIUM CHLORIDE 0.9 % IV SOLN
INTRAVENOUS | Status: DC | PRN
  Administered 2018-02-10: 500 mL

## 2018-02-10 MED ORDER — HYDROCHLOROTHIAZIDE 25 MG PO TABS
25.0000 mg | ORAL_TABLET | Freq: Every day | ORAL | Status: DC
  Administered 2018-02-11: 25 mg via ORAL
  Filled 2018-02-10: qty 1

## 2018-02-10 MED ORDER — LIDOCAINE 2% (20 MG/ML) 5 ML SYRINGE
INTRAMUSCULAR | Status: AC
  Filled 2018-02-10: qty 5

## 2018-02-10 MED ORDER — PROPOFOL 10 MG/ML IV BOLUS
INTRAVENOUS | Status: DC | PRN
  Administered 2018-02-10: 100 mg via INTRAVENOUS

## 2018-02-10 MED ORDER — METHOCARBAMOL 500 MG IVPB - SIMPLE MED
500.0000 mg | Freq: Four times a day (QID) | INTRAVENOUS | Status: DC | PRN
  Administered 2018-02-10: 500 mg via INTRAVENOUS
  Filled 2018-02-10: qty 50

## 2018-02-10 MED ORDER — ROCURONIUM BROMIDE 100 MG/10ML IV SOLN
INTRAVENOUS | Status: AC
  Filled 2018-02-10: qty 1

## 2018-02-10 MED ORDER — LIDOCAINE 2% (20 MG/ML) 5 ML SYRINGE
INTRAMUSCULAR | Status: DC | PRN
  Administered 2018-02-10: 80 mg via INTRAVENOUS

## 2018-02-10 MED ORDER — BUPIVACAINE LIPOSOME 1.3 % IJ SUSP
INTRAMUSCULAR | Status: DC | PRN
  Administered 2018-02-10: 20 mL

## 2018-02-10 MED ORDER — EPHEDRINE 5 MG/ML INJ
INTRAVENOUS | Status: AC
  Filled 2018-02-10: qty 10

## 2018-02-10 MED ORDER — PROPOFOL 10 MG/ML IV BOLUS
INTRAVENOUS | Status: AC
  Filled 2018-02-10: qty 20

## 2018-02-10 MED ORDER — DEXAMETHASONE SODIUM PHOSPHATE 10 MG/ML IJ SOLN
INTRAMUSCULAR | Status: DC | PRN
  Administered 2018-02-10: 10 mg via INTRAVENOUS

## 2018-02-10 MED ORDER — POLYETHYLENE GLYCOL 3350 17 G PO PACK: 17.0000 g | PACK | Freq: Every day | ORAL | Status: DC | PRN

## 2018-02-10 MED ORDER — ROCURONIUM BROMIDE 50 MG/5ML IV SOSY
PREFILLED_SYRINGE | INTRAVENOUS | Status: DC | PRN
  Administered 2018-02-10: 50 mg via INTRAVENOUS

## 2018-02-10 MED ORDER — OXYCODONE-ACETAMINOPHEN 5-325 MG PO TABS: 1.0000 | ORAL_TABLET | ORAL | 0 refills | Status: AC | PRN

## 2018-02-10 SURGICAL SUPPLY — 49 items
AGENT HMST SPONGE THK3/8 (HEMOSTASIS) ×1
APL SKNCLS STERI-STRIP NONHPOA (GAUZE/BANDAGES/DRESSINGS) ×1
BAG SPEC THK2 15X12 ZIP CLS (MISCELLANEOUS)
BAG ZIPLOCK 12X15 (MISCELLANEOUS) IMPLANT
BENZOIN TINCTURE PRP APPL 2/3 (GAUZE/BANDAGES/DRESSINGS) ×2 IMPLANT
CATH FOLEY 2W COUNCIL 5CC 16FR (CATHETERS) ×1 IMPLANT
CLEANER TIP ELECTROSURG 2X2 (MISCELLANEOUS) ×2 IMPLANT
COVER SURGICAL LIGHT HANDLE (MISCELLANEOUS) ×2 IMPLANT
COVER WAND RF STERILE (DRAPES) IMPLANT
DRAPE MICROSCOPE LEICA (MISCELLANEOUS) ×2 IMPLANT
DRAPE SHEET LG 3/4 BI-LAMINATE (DRAPES) ×2 IMPLANT
DRAPE SURG 17X11 SM STRL (DRAPES) ×2 IMPLANT
DRSG ADAPTIC 3X8 NADH LF (GAUZE/BANDAGES/DRESSINGS) ×2 IMPLANT
DRSG PAD ABDOMINAL 8X10 ST (GAUZE/BANDAGES/DRESSINGS) ×8 IMPLANT
DURAPREP 26ML APPLICATOR (WOUND CARE) ×2 IMPLANT
ELECT BLADE TIP CTD 4 INCH (ELECTRODE) ×2 IMPLANT
ELECT REM PT RETURN 15FT ADLT (MISCELLANEOUS) ×2 IMPLANT
GAUZE SPONGE 4X4 12PLY STRL (GAUZE/BANDAGES/DRESSINGS) ×2 IMPLANT
GLOVE BIOGEL PI IND STRL 6.5 (GLOVE) ×1 IMPLANT
GLOVE BIOGEL PI IND STRL 8.5 (GLOVE) ×1 IMPLANT
GLOVE BIOGEL PI INDICATOR 6.5 (GLOVE) ×1
GLOVE BIOGEL PI INDICATOR 8.5 (GLOVE) ×1
GLOVE ECLIPSE 8.0 STRL XLNG CF (GLOVE) ×4 IMPLANT
GLOVE SURG SS PI 6.5 STRL IVOR (GLOVE) ×2 IMPLANT
GOWN STRL REUS W/ TWL LRG LVL3 (GOWN DISPOSABLE) ×1 IMPLANT
GOWN STRL REUS W/TWL LRG LVL3 (GOWN DISPOSABLE) ×2
GOWN STRL REUS W/TWL XL LVL3 (GOWN DISPOSABLE) ×2 IMPLANT
GUIDEWIRE ANG ZIPWIRE 038X150 (WIRE) ×1 IMPLANT
HEMOSTAT SPONGE AVITENE ULTRA (HEMOSTASIS) ×2 IMPLANT
KIT BASIN OR (CUSTOM PROCEDURE TRAY) ×2 IMPLANT
MANIFOLD NEPTUNE II (INSTRUMENTS) ×2 IMPLANT
NDL SPNL 18GX3.5 QUINCKE PK (NEEDLE) ×2 IMPLANT
NEEDLE HYPO 22GX1.5 SAFETY (NEEDLE) ×2 IMPLANT
NEEDLE SPNL 18GX3.5 QUINCKE PK (NEEDLE) ×4 IMPLANT
PACK LAMINECTOMY ORTHO (CUSTOM PROCEDURE TRAY) ×2 IMPLANT
PAD ABD 8X10 STRL (GAUZE/BANDAGES/DRESSINGS) ×2 IMPLANT
PATTIES SURGICAL .5 X.5 (GAUZE/BANDAGES/DRESSINGS) IMPLANT
PATTIES SURGICAL .75X.75 (GAUZE/BANDAGES/DRESSINGS) ×2 IMPLANT
PATTIES SURGICAL 1X1 (DISPOSABLE) IMPLANT
SPONGE LAP 4X18 RFD (DISPOSABLE) ×4 IMPLANT
STAPLER VISISTAT 35W (STAPLE) ×2 IMPLANT
SUT VIC AB 1 CT1 27 (SUTURE) ×4
SUT VIC AB 1 CT1 27XBRD ANTBC (SUTURE) ×2 IMPLANT
SUT VIC AB 2-0 CT1 27 (SUTURE) ×4
SUT VIC AB 2-0 CT1 TAPERPNT 27 (SUTURE) ×2 IMPLANT
SYR 20CC LL (SYRINGE) ×2 IMPLANT
TAPE CLOTH SURG 6X10 WHT LF (GAUZE/BANDAGES/DRESSINGS) ×1 IMPLANT
TOWEL OR 17X26 10 PK STRL BLUE (TOWEL DISPOSABLE) ×2 IMPLANT
TOWEL OR NON WOVEN STRL DISP B (DISPOSABLE) ×2 IMPLANT

## 2018-02-10 NOTE — Plan of Care (Signed)
Plan of care 

## 2018-02-10 NOTE — Op Note (Signed)
NAMEHOLT, LEARD MEDICAL RECORD [id number] ACCOUNT [id number] DATE OF BIRTH:[birth year] FACILITY[facility] LOCATION: [facility] PHYSICIAN:Shira Bobst Sanjuana Kava, MD  OPERATIVE REPORT  DATE OF PROCEDURE:  02/10/2018  SURGEON:  Ranee Gosselin, MD  ASSISTANT:  Dimitri Ped, PA  PREOPERATIVE DIAGNOSES: 1.  Severe spinal stenosis with a complete block at L2-L3. 2.  Severe spinal stenosis with a complete block at L3-L4. 3.  Severe spinal stenosis at L4-L5 with a complete block. 4.  Foraminal stenosis for the L3, L4 and L5 roots bilaterally.  POSTOPERATIVE DIAGNOSES: 1.  Severe spinal stenosis with a complete block at L2-L3. 2.  Severe spinal stenosis with a complete block at L3-L4. 3.  Severe spinal stenosis at L4-L5 with a complete block. 4.  Foraminal stenosis for the L3, L4 and L5 roots bilaterally.  OPERATION: 1.  Foraminotomies for the L3, L4 and the L5 nerve roots bilaterally. 2.  Complete decompressive lumbar laminectomy at L2-L3 for a complete block secondary to spinal stenosis. 3.  Complete lumbar laminectomy at L3-L4 for spinal stenosis with a complete block. 4.  Complete decompressive lumbar laminectomy at L4-L5 for spinal stenosis with a complete block, so he had 3 levels with a complete block.    HISTORY:   He came in the office in a wheelchair, unable to walk with severe pain and weakness in both lower extremities.  DESCRIPTION OF PROCEDURE:  Under general anesthesia with the patient on a Wilson frame, a routine orthopedic prepping and draping of the low back was carried out.  He had 2 g of IV Ancef.  The appropriate time-out was carried out.  At this particular  time, I did not need to mark the back since we were going centrally.  He did have bilateral leg pain.  Two needles were placed in the back for localization purposes.  An x-ray was taken.  Once we verified the L2-L3 level, an incision was made over this  level.  It was extended proximally and distally all the  way down to the L4-L5 level.  At this time, bleeders were identified and cauterized.  Self-retaining retractors were inserted.  I then stripped the muscle from the lamina and spinous processes  bilaterally, starting out at L2-L3 and all the way down to L4-L5 bilaterally.  Once this was done, 2 Kocher clamps were placed on the spinous processes.  Another x-ray was taken.  Following that, the [facility] retractors were inserted.  Good hemostasis  was maintained.  I then removed the spinous processes of L2, L3, L4.  I then went down and did a complete decompressive lumbar laminectomies at all 3 levels; that is, at L2-L3, L3-L4, L4-L5.  The dura was exposed.  It was protected at all times.  I went  out laterally at both recesses and decompressed those as well.  We did foraminotomies as well as mentioned above.  Note good hemostasis was maintained.  During each stage of the procedure.  I utilized the hockey stick to make sure we were decompressed  enough laterally, superiorly and distally.  We continued our decompression both distally and proximally until we were free with the hockey stick.  We thoroughly irrigated out the area, and during the procedure, several x-rays were taken with the  Mora.   A #2 Penfield was inserted and x-rays were taken to verify the position.  At the end of the procedure, we thoroughly irrigated out the area.  We loosely applied some thrombin-soaked Gelfoam.  We closed the wound in layers in the  usual  fashion.  I did leave a small distal deep and proximal deep parts of the wound open for drainage purposes.  We injected 20 mL of Exparel at the end of the procedure for pain control.  At the beginning of the procedure, we injected 20 mL of 0.25% Marcaine  with epinephrine to control the bleeding.  At the end of the case, the remaining part of the wound was closed with staples.  Sterile dressings were applied.  He had 2 g of IV Ancef.  He will be admitted overnight.  LN/NUANCE   D:02/10/2018 T:02/10/2018 JOB:[id number]

## 2018-02-10 NOTE — Plan of Care (Signed)
Pt alert and oriented, family at the bedside. Resting comfortably post op. Education provided on proper Economist.  RN will monitor.

## 2018-02-10 NOTE — Interval H&P Note (Signed)
History and Physical Interval Note:  02/10/2018 7:20 AM  Benjamin Buchanan Benjamin Buchanan  has presented today for surgery, with the diagnosis of lumbar spinal stenosis  The various methods of treatment have been discussed with the patient and family. After consideration of risks, benefits and other options for treatment, the patient has consented to  Procedure(s) with comments: Central decompression of L2-3 and L4-5 (N/A) - 141min as a surgical intervention .  The patient's history has been reviewed, patient examined, no change in status, stable for surgery.  I have reviewed the patient's chart and labs.  Questions were answered to the patient's satisfaction.     Latanya Maudlin

## 2018-02-10 NOTE — H&P (Signed)
Benjamin Buchanan is an 85 y.o. male.   Chief Complaint: Bilateral leg pain HPI: He has complained of bilateral leg pain and weakness in his lowers .  Past Medical History:  Diagnosis Date  . Anemia   . History of prostate cancer 1994  . HLD (hyperlipidemia)   . HTN (hypertension)   . PVC (premature ventricular contraction)    history of    Past Surgical History:  Procedure Laterality Date  . HERNIA REPAIR    . INGUINAL HERNIA REPAIR Left 10/23/2016   Procedure: LEFT INGUINAL HERNIA REPAIR WITH MESH  ;  Surgeon: Rolm Bookbinder, MD;  Location: [facility];  Service: General;  Laterality: Left;  . INSERTION OF MESH Left 10/23/2016   Procedure: INSERTION OF MESH;  Surgeon: Rolm Bookbinder, MD;  Location: [facility];  Service: General;  Laterality: Left;  . PROSTATE SURGERY      Family History  Problem Relation Age of Onset  . Cancer Father        prostate cancer  . Hypertension Other        family history   Social History:  reports that he has never smoked. He has never used smokeless tobacco. He reports that he does not drink alcohol or use drugs.  Allergies:  Allergies  Allergen Reactions  . Lipitor [Atorvastatin] Other (See Comments)    myalgia    Medications Prior to Admission  Medication Sig Dispense Refill  . acetaminophen (TYLENOL) 325 MG tablet Take 325 mg by mouth 2 (two) times daily as needed for moderate pain.    Marland Kitchen amLODipine (NORVASC) 5 MG tablet TAKE 1 TABLET BY MOUTH EVERY DAY (Patient taking differently: Take 5 mg by mouth at bedtime. ) 90 tablet 1  . aspirin 81 MG tablet Take 81 mg by mouth every Monday, Wednesday, and Friday.     . benazepril (LOTENSIN) 20 MG tablet TAKE 1 TABLET EVERY DAY (Patient taking differently: Take 20 mg by mouth at bedtime. ) 90 tablet 1  . Cholecalciferol 1000 UNITS capsule Take 1,000 Units by mouth daily.    Marland Kitchen FeFum-FePoly-FA-B Cmp-C-Biot (INTEGRA PLUS) CAPS TAKE 1 CAPSULE BY MOUTH EVERY  DAY (Patient taking differently: Take 1 capsule by mouth at bedtime. ) 90 capsule 1  . fenofibrate 54 MG tablet TAKE 1 TABLET BY MOUTH EVERY DAY 90 tablet 1  . fish oil-omega-3 fatty acids 1000 MG capsule Take 1 g by mouth daily.    . hydrochlorothiazide (HYDRODIURIL) 25 MG tablet TAKE 1 TABLET BY MOUTH EVERY DAY 90 tablet 0  . rosuvastatin (CRESTOR) 5 MG tablet Take 5 mg by mouth every Monday, Wednesday, and Friday.     . vitamin C (ASCORBIC ACID) 500 MG tablet Take 500 mg by mouth 2 (two) times daily.     Marland Kitchen glucose blood test strip Check BS QD and PRN 100 each 11    Results for orders placed or performed during the hospital encounter of 02/08/18 (from the past 48 hour(s))  Surgical pcr screen     Status: None   Collection Time: 02/08/18  8:05 AM  Result Value Ref Range   MRSA, PCR NEGATIVE NEGATIVE   Staphylococcus aureus NEGATIVE NEGATIVE    Comment: (NOTE) The Xpert SA Assay (FDA approved for NASAL specimens in patients 11 years of age and older), is one component of a comprehensive surveillance program. It is not intended to diagnose infection nor to guide or monitor treatment. Performed at [facility], [place]  [address], [place], [place] [address]   CBC WITH DIFFERENTIAL     Status: Abnormal   Collection Time: 02/08/18  8:05 AM  Result Value Ref Range   WBC 13.5 (H) 4.0 - 10.5 K/uL   RBC 3.95 (L) 4.22 - 5.81 MIL/uL   Hemoglobin 12.5 (L) 13.0 - 17.0 g/dL   HCT 38.8 (L) 39.0 - 52.0 %   MCV 98.2 80.0 - 100.0 fL   MCH 31.6 26.0 - 34.0 pg   MCHC 32.2 30.0 - 36.0 g/dL   RDW 11.9 11.5 - 15.5 %   Platelets 351 150 - 400 K/uL   nRBC 0.0 0.0 - 0.2 %   Neutrophils Relative % 84 %   Neutro Abs 11.4 (H) 1.7 - 7.7 K/uL   Lymphocytes Relative 9 %   Lymphs Abs 1.3 0.7 - 4.0 K/uL   Monocytes Relative 5 %   Monocytes Absolute 0.6 0.1 - 1.0 K/uL   Eosinophils Relative 1 %   Eosinophils Absolute 0.1 0.0 - 0.5 K/uL   Basophils Relative 0 %   Basophils Absolute 0.1 0.0 -  0.1 K/uL   Immature Granulocytes 1 %   Abs Immature Granulocytes 0.08 (H) 0.00 - 0.07 K/uL    Comment: Performed at [facility], [place] [address], [place], [place] [address]  Comprehensive metabolic panel     Status: Abnormal   Collection Time: 02/08/18  8:05 AM  Result Value Ref Range   Sodium 140 135 - 145 mmol/L   Potassium 3.7 3.5 - 5.1 mmol/L   Chloride 105 98 - 111 mmol/L   CO2 25 22 - 32 mmol/L   Glucose, Bld 109 (H) 70 - 99 mg/dL   BUN 21 8 - 23 mg/dL   Creatinine, Ser 1.25 (H) 0.61 - 1.24 mg/dL   Calcium 9.8 8.9 - 10.3 mg/dL   Total Protein 7.0 6.5 - 8.1 g/dL   Albumin 4.0 3.5 - 5.0 g/dL   AST 25 15 - 41 U/L   ALT 21 0 - 44 U/L   Alkaline Phosphatase 47 38 - 126 U/L   Total Bilirubin 0.7 0.3 - 1.2 mg/dL   GFR calc non Af Amer 51 (L) >60 mL/min   GFR calc Af Amer 60 (L) >60 mL/min   Anion gap 10 5 - 15    Comment: Performed at [facility], [place] [address] [place], [place] [address]  Protime-INR     Status: None   Collection Time: 02/08/18  8:05 AM  Result Value Ref Range   Prothrombin Time 13.5 11.4 - 15.2 seconds   INR 1.04     Comment: Performed at [facility], [place] [address], [place],  [address]   Dg Lumbar Spine 2-3 Views  Result Date: 02/08/2018 CLINICAL DATA:  Lumbar spine pain. Bilateral leg pain. Preop. EXAM: LUMBAR SPINE - 2-3 VIEW COMPARISON:  Lumbar spine radiographs 08/24/2017 and myelogram 01/01/2018 FINDINGS: There are 5 non rib-bearing lumbar type vertebrae. Trace anterolisthesis of L5 on S1 is again noted. There is minimal right convex curvature of the lumbar spine. No acute fracture is identified. Mild disc space narrowing and moderate endplate osteophyte formation are present throughout the lumbar spine. Surgical clips are present in the pelvis bilaterally. IMPRESSION: Lumbar spondylosis without acute osseous abnormality. Electronically Signed   By: Logan Bores M.D.   On: 02/08/2018 08:38     Review of Systems  Constitutional: Negative.   HENT: Negative.   Eyes: Negative.   Respiratory: Negative.  Cardiovascular: Negative.   Gastrointestinal: Negative.   Genitourinary: Negative.   Musculoskeletal: Positive for back pain.  Skin: Negative.   Neurological: Positive for weakness.  Endo/Heme/Allergies: Negative.   Psychiatric/Behavioral: Negative.     Blood pressure (!) 142/72, pulse 80, temperature 98.3 F (36.8 C), temperature source Oral, resp. rate 18, height 5\' 10"  (1.778 m), weight 68.5 kg, SpO2 98 %. Physical Exam  Constitutional: He appears well-developed.  HENT:  Head: Normocephalic.  Eyes: Pupils are equal, round, and reactive to light.  Neck: Normal range of motion.  Cardiovascular: Normal rate.  Respiratory: Effort normal.  GI: Soft.  Musculoskeletal:        General: Tenderness present.  Neurological:  Generalized weakness of both Lower extremities.     Assessment/Plan Decompressive lumbar laminectomies of L-2-L-3 and L-4- L-5 and possible L-3-L-4 for Spinal Stenosis.  Latanya Maudlin, MD 02/10/2018, 7:15 AM

## 2018-02-10 NOTE — Anesthesia Postprocedure Evaluation (Signed)
Anesthesia Post Note  Patient: Benjamin Buchanan  Procedure(s) Performed: L2-3, L3-4 and L4-5 complete decompressive lumbar laminectomies for spinal stenosis, Foraminotomies  for L3, L4, L5 nerve roots. (N/A Back)     Patient location during evaluation: PACU Anesthesia Type: General Level of consciousness: awake and alert Pain management: pain level controlled Vital Signs Assessment: post-procedure vital signs reviewed and stable Respiratory status: spontaneous breathing, nonlabored ventilation, respiratory function stable and patient connected to nasal cannula oxygen Cardiovascular status: blood pressure returned to baseline and stable Postop Assessment: no apparent nausea or vomiting Anesthetic complications: no    Last Vitals:  Vitals:   02/10/18 1408 02/10/18 1504  BP: 133/69 (!) 141/83  Pulse: 96 99  Resp: 16 16  Temp: 36.6 C 36.7 C  SpO2: 100% 99%    Last Pain:  Vitals:   02/10/18 1625  TempSrc:   PainSc: 0-No pain                 Barnet Glasgow

## 2018-02-10 NOTE — Discharge Instructions (Signed)

## 2018-02-10 NOTE — Procedures (Signed)
DATE: 02/10/2018   PROCEDURE: - Difficult Foley catheter placement  INDICATIONS: 85 y.o. male with remote history of prostate cancer s/p prostatectomy undergoing orthopedic surgery with difficulty Foley catheter placement by nursing. Urology consulted for assistance with management.  DESCRIPTION: Patient prepped and draped in sterile fashion. Angle-tip glidewire was advanced per urethra and into the bladder. A 16Fr 2-way Council-tip catheter was advanced over the wire and into the bladder with moderate resistance at proximally. There was immediate return of clear yellow urine. 10 mL sterile saline was instilled into catheter balloon.  PLAN:  - OK for TOV in the am.  As long as he is getting his PSA checked by someone, he can follow-up as needed with [place]. If he is not getting prostate cancer care, he will need to follow-up with [place] in clinic.  Dr. Gloriann Loan was present for the entirety of the procedure.

## 2018-02-10 NOTE — Brief Op Note (Signed)
02/10/2018  10:19 AM  PATIENT:  Benjamin Buchanan  85 y.o. male  PRE-OPERATIVE DIAGNOSIS:  lumbar spinal stenosis   With Complete Blocks at L-2-L-3,L-3-L-4 and L-4-L-5. Foraminal Stenosis involving the L-3,L-4, and L-5 Nerve Roots Bilaterally.  POST-OPERATIVE DIAGNOSIS: Same as Pre-Op  PROCEDURE:  Procedure(s) with comments: L2-3, L3-4 and L4-5 complete decompressive lumbar laminectomies for spinal stenosis, Foraminotomies  for L3, L4, L5 nerve roots. (N/A) - 165min.Complete Blocks at the above levels.  SURGEON:  Surgeon(s) and Role:    Latanya Maudlin, MD - Primary  PHYSICIAN ASSISTANT:Amber Branch PA   ASSISTANTSAmber Harman PA   ANESTHESIA:   general  EBL:  100 mL   BLOOD ADMINISTERED:none  DRAINS: none   LOCAL MEDICATIONS USED:  MARCAINE 20cc of 0.25% with Epinephrine at the start of the case and 20cc of Exparel at the end of the case.    SPECIMEN:  No Specimen  DISPOSITION OF SPECIMEN:  N/A  COUNTS:  YES  TOURNIQUET:  * No tourniquets in log *  DICTATION: .Other Dictation: Dictation Number [id number]  PLAN OF CARE: Admit for overnight observation  PATIENT DISPOSITION:  Stable in OR   Delay start of Pharmacological VTE agent (>24hrs) due to surgical blood loss or risk of bleeding: yes

## 2018-02-10 NOTE — Consult Note (Signed)
I Was present with the resident who placed the catheter in the operating room  Intraoperative consult for difficult catheterization.  Patient has a history of prostate cancer status post prostatectomy in the past.  He has not been seen in our office for about 10 years.  Nursing had difficulty placing a catheter prior to his laminectomies.  Patient was given Ancef.  Under sterile conditions, a Glidewire was advanced into the bladder and a 66 [place] council tip catheter was placed over the wire without resistance.  There was return of clear yellow urine  Okay for voiding trial in the morning.  As long as he is getting his PSA checked by someone, he can follow-up as needed with [place].  If he is not getting prostate cancer care, he will need to follow-up with [place].

## 2018-02-10 NOTE — Anesthesia Procedure Notes (Signed)
Procedure Name: Intubation Date/Time: 02/10/2018 7:34 AM Performed by: Maxwell Caul, CRNA Pre-anesthesia Checklist: Patient identified, Emergency Drugs available, Suction available and Patient being monitored Patient Re-evaluated:Patient Re-evaluated prior to induction Oxygen Delivery Method: Circle system utilized Preoxygenation: Pre-oxygenation with 100% oxygen Induction Type: IV induction Ventilation: Mask ventilation without difficulty Laryngoscope Size: Mac and 4 Grade View: Grade I Tube type: Oral Tube size: 7.5 mm Number of attempts: 1 Airway Equipment and Method: Stylet Placement Confirmation: ETT inserted through vocal cords under direct vision,  positive ETCO2 and breath sounds checked- equal and bilateral Secured at: 22 cm Tube secured with: Tape Dental Injury: Teeth and Oropharynx as per pre-operative assessment

## 2018-02-10 NOTE — Evaluation (Signed)
Physical Therapy Evaluation Patient Details Name: Benjamin Buchanan MRN: [id number] DOB: 05-26-1928 Today's Date: 02/10/2018   History of Present Illness  85 yo male s/p L2-L3, L3-L4, L4-L5 lumbar laminectomy, foraminotomies L3-L5 on 02/10/18. PMH includes anemia, prostate cancer, HLD, HTN, PVC, L hernia repair.   Clinical Impression   Pt presents with moderate back pain with mobility, decreased knowledge and recall of back precautions, difficulty performing bed mobility, and decreased activity tolerance due to back pain. Pt to benefit from acute PT to address deficits. Pt ambulated 200 ft without AD, and required occasional steadying. Verbal cuing to maintain back precautions during session reinforced very frequently. Pt educated on ankle pumps (20/hour) to perform this afternoon/evening to increase circulation, to pt's tolerance and limited by pain. PT to progress mobility as tolerated, and will continue to follow acutely.        Follow Up Recommendations Follow surgeon's recommendation for DC plan and follow-up therapies;Supervision for mobility/OOB    Equipment Recommendations  None recommended by PT    Recommendations for Other Services       Precautions / Restrictions Precautions Precautions: Fall;Back Precaution Booklet Issued: Yes (comment) Precaution Comments: handout administered, precautions reviewed x3 with pt during session, handout administered.  Restrictions Weight Bearing Restrictions: No      Mobility  Bed Mobility Overal bed mobility: Needs Assistance Bed Mobility: Rolling;Sidelying to Sit Rolling: Min guard Sidelying to sit: Min assist       General bed mobility comments: Pt instructed in log roll technique and sidelying to sit to maintain back precautions. Min guard for safety for rolling, min assist for sidelying to sit for trunk elevation and LE lowering off EOB.   Transfers Overall transfer level: Needs assistance Equipment used: None Transfers:  Sit to/from Stand Sit to Stand: Min guard;From elevated surface         General transfer comment: Min guard for safety, verbal cuing to keep back straight and avoid bending/flexing back when rising and lowering.   Ambulation/Gait   Gait Distance (Feet): 200 Feet Assistive device: None Gait Pattern/deviations: Step-through pattern;Decreased stride length;Antalgic;Narrow base of support Gait velocity: normal    General Gait Details: Min guard for safety, with occasional min assist for steadying. Verbal cuing to turn whole body when changing directions as opposed to twisting at spine.   Stairs            Wheelchair Mobility    Modified Rankin (Stroke Patients Only)       Balance Overall balance assessment: Mild deficits observed, not formally tested                                           Pertinent Vitals/Pain Pain Assessment: 0-10 Pain Score: 4  Pain Location: R side of back  Pain Descriptors / Indicators: Aching;Sore Pain Intervention(s): Limited activity within patient's tolerance;Repositioned;Monitored during session;Premedicated before session    Home Living Family/patient expects to be discharged to:: Private residence Living Arrangements: Alone Available Help at Discharge: Family;Available 24 hours/day(Pt's daughter to stay with pt until Friday but lives in [place], Pt's grandkids to stay with pt over the weekend. Otherwise, pt with closeby friends and cousin that can assist at home) Type of Home: House Home Access: Stairs to enter Entrance Stairs-Rails: Doctor, general practice of Steps: 2 Home Layout: One level Home Equipment: Environmental consultant - 2 wheels;Cane - single point;Hand held shower head  Prior Function Level of Independence: Independent               Hand Dominance   Dominant Hand: Right    Extremity/Trunk Assessment   Upper Extremity Assessment Upper Extremity Assessment: Overall WFL for tasks  assessed    Lower Extremity Assessment Lower Extremity Assessment: Overall WFL for tasks assessed    Cervical / Trunk Assessment Cervical / Trunk Assessment: Normal  Communication   Communication: No difficulties  Cognition Arousal/Alertness: Awake/alert Behavior During Therapy: WFL for tasks assessed/performed Overall Cognitive Status: Within Functional Limits for tasks assessed                                        General Comments      Exercises     Assessment/Plan    PT Assessment Patient needs continued PT services  PT Problem List Decreased knowledge of precautions;Decreased mobility;Decreased activity tolerance;Decreased safety awareness;Pain       PT Treatment Interventions Therapeutic activities;Gait training;Therapeutic exercise;Patient/family education;Stair training;Balance training;Functional mobility training    PT Goals (Current goals can be found in the Care Plan section)  Acute Rehab PT Goals Patient Stated Goal: go home, be independent PT Goal Formulation: With patient Time For Goal Achievement: 02/17/18 Potential to Achieve Goals: Good    Frequency 7X/week   Barriers to discharge        Co-evaluation               AM-PAC PT "6 Clicks" Mobility  Outcome Measure Help needed turning from your back to your side while in a flat bed without using bedrails?: A Little Help needed moving from lying on your back to sitting on the side of a flat bed without using bedrails?: A Little Help needed moving to and from a bed to a chair (including a wheelchair)?: A Little Help needed standing up from a chair using your arms (e.g., wheelchair or bedside chair)?: A Little Help needed to walk in hospital room?: A Little Help needed climbing 3-5 steps with a railing? : A Little 6 Click Score: 18    End of Session Equipment Utilized During Treatment: Gait belt Activity Tolerance: Patient tolerated treatment well Patient left: in  chair;with chair alarm set;with call bell/phone within reach;with family/visitor present(Pt instructed to sit up for 20-30 minutes to avoid slouched posture, press call button when ready to get back to bed) Nurse Communication: Mobility status PT Visit Diagnosis: Other abnormalities of gait and mobility (R26.89);Difficulty in walking, not elsewhere classified (R26.2)    Time: 4098-1191 PT Time Calculation (min) (ACUTE ONLY): 26 min   Charges:   PT Evaluation $PT Eval Low Complexity: 1 Low PT Treatments $Gait Training: 8-22 mins        Nicola Police, PT Acute Rehabilitation Services Pager [phone]  Office [phone]   Britteney Ayotte D Despina Hidden 02/10/2018, 6:05 PM

## 2018-02-10 NOTE — Transfer of Care (Signed)
Immediate Anesthesia Transfer of Care Note  Patient: Benjamin Buchanan  Procedure(s) Performed: L2-3, L3-4 and L4-5 complete decompressive lumbar laminectomies for spinal stenosis, Foraminotomies  for L3, L4, L5 nerve roots. (N/A Back)  Patient Location: PACU  Anesthesia Type:General  Level of Consciousness: awake, alert  and oriented  Airway & Oxygen Therapy: Patient Spontanous Breathing and Patient connected to face mask oxygen  Post-op Assessment: Report given to RN and Post -op Vital signs reviewed and stable  Post vital signs: Reviewed and stable  Last Vitals:  Vitals Value Taken Time  BP 146/72 02/10/2018 10:41 AM  Temp    Pulse 88 02/10/2018 10:42 AM  Resp 18 02/10/2018 10:42 AM  SpO2 100 % 02/10/2018 10:42 AM  Vitals shown include unvalidated device data.  Last Pain:  Vitals:   02/10/18 0551  TempSrc:   PainSc: 0-No pain      Patients Stated Pain Goal: 4 (34/35/68 6168)  Complications: No apparent anesthesia complications

## 2018-02-11 ENCOUNTER — Encounter (HOSPITAL_COMMUNITY): Payer: Self-pay | Admitting: Orthopedic Surgery

## 2018-02-11 DIAGNOSIS — M48061 Spinal stenosis, lumbar region without neurogenic claudication: Secondary | ICD-10-CM | POA: Diagnosis not present

## 2018-02-11 DIAGNOSIS — R269 Unspecified abnormalities of gait and mobility: Secondary | ICD-10-CM | POA: Diagnosis not present

## 2018-02-11 NOTE — Progress Notes (Signed)
Physical Therapy Treatment Patient Details Name: Benjamin Buchanan MRN: [id number] DOB: 1928/02/16 Today's Date: 02/11/2018    History of Present Illness 85 yo male s/p L2-L3, L3-L4, L4-L5 lumbar laminectomy, foraminotomies L3-L5 on 02/10/18. PMH includes anemia, prostate cancer, HLD, HTN, PVC, L hernia repair.     PT Comments    Pt dressed and eager to D/C.  Assisted with amb a greater distance in hallway, practiced stairs then instructed on back precautions and activity limitations.  Instructed on sleeping positions with use of pillows.  Addressed all mobility questions, discussed appropriate activity, educated on use of ICE.  Pt ready for D/C to home.   Follow Up Recommendations  Follow surgeon's recommendation for DC plan and follow-up therapies;Supervision for mobility/OOB     Equipment Recommendations  None recommended by PT    Recommendations for Other Services       Precautions / Restrictions Precautions Precautions: Fall;Back Precaution Booklet Issued: Yes (comment) Precaution Comments: reviewed back precautions and in ADL context.  Pt unable to state without cues Restrictions Weight Bearing Restrictions: No    Mobility  Bed Mobility               General bed mobility comments: OOB in recliner   Transfers Overall transfer level: Needs assistance Equipment used: None Transfers: Sit to/from Stand Sit to Stand: Supervision         General transfer comment: safety  Ambulation/Gait Ambulation/Gait assistance: Supervision;Min guard Gait Distance (Feet): 250 Feet Assistive device: None Gait Pattern/deviations: Step-through pattern     General Gait Details: safety   Stairs Stairs: Yes Stairs assistance: Supervision;Min guard Stair Management: Two rails;Alternating pattern;Forwards Number of Stairs: 3 General stair comments: safety   Wheelchair Mobility    Modified Rankin (Stroke Patients Only)       Balance                                             Cognition Arousal/Alertness: Awake/alert Behavior During Therapy: WFL for tasks assessed/performed Overall Cognitive Status: Within Functional Limits for tasks assessed                                 General Comments: impulsive      Exercises      General Comments        Pertinent Vitals/Pain Pain Assessment: 0-10 Pain Score: 3  Pain Location: back Pain Descriptors / Indicators: Sore Pain Intervention(s): Monitored during session;Repositioned;Premedicated before session    Home Living                      Prior Function            PT Goals (current goals can now be found in the care plan section) Progress towards PT goals: Progressing toward goals    Frequency    7X/week      PT Plan Current plan remains appropriate    Co-evaluation              AM-PAC PT "6 Clicks" Mobility   Outcome Measure  Help needed turning from your back to your side while in a flat bed without using bedrails?: None Help needed moving from lying on your back to sitting on the side of a flat bed without using bedrails?: None Help needed moving to and  from a bed to a chair (including a wheelchair)?: None Help needed standing up from a chair using your arms (e.g., wheelchair or bedside chair)?: None Help needed to walk in hospital room?: None Help needed climbing 3-5 steps with a railing? : None 6 Click Score: 24    End of Session Equipment Utilized During Treatment: Gait belt Activity Tolerance: Patient tolerated treatment well Patient left: in chair;with chair alarm set;with call bell/phone within reach;with family/visitor present Nurse Communication: (pt ready for D/C to home) PT Visit Diagnosis: Other abnormalities of gait and mobility (R26.89);Difficulty in walking, not elsewhere classified (R26.2)     Time: 0131-4388 PT Time Calculation (min) (ACUTE ONLY): 15 min  Charges:  $Gait Training: 8-22 mins                      Rica Koyanagi  PTA Acute  Rehabilitation Services Pager      [phone] Office      [phone]

## 2018-02-11 NOTE — Evaluation (Signed)
Occupational Therapy Evaluation Patient Details Name: Benjamin Buchanan MRN: [id number] DOB: 1928/07/08 Today's Date: 02/11/2018    History of Present Illness 85 yo male s/p L2-L3, L3-L4, L4-L5 lumbar laminectomy, foraminotomies L3-L5 on 02/10/18. PMH includes anemia, prostate cancer, HLD, HTN, PVC, L hernia repair.    Clinical Impression   Pt was admitted for the above sx. All education was completed.  Pt will benefit from 3:1 at home.  He needs min A for donning R sock; otherwise he can complete basic ADL with set up and AE    Follow Up Recommendations  Supervision/Assistance - 24 hour(initial )    Equipment Recommendations  3 in 1 bedside commode    Recommendations for Other Services       Precautions / Restrictions Precautions Precautions: Fall;Back Precaution Booklet Issued: Yes (comment) Precaution Comments: reviewed back precautions and in ADL context.  Pt unable to state without cues Restrictions Weight Bearing Restrictions: No      Mobility Bed Mobility               General bed mobility comments: oob  Transfers   Equipment used: None   Sit to Stand: Supervision         General transfer comment: cues for keeping back straight    Balance                                           ADL either performed or assessed with clinical judgement   ADL Overall ADL's : Needs assistance/impaired     Grooming: Set up   Upper Body Bathing: Set up   Lower Body Bathing: Sit to/from stand;Supervison/ safety;With adaptive equipment(has long sponge)   Upper Body Dressing : Set up   Lower Body Dressing: Minimal assistance;Sit to/from stand Lower Body Dressing Details (indicate cue type and reason): for  R  sock. Pt has a Secondary school teacher at home Toilet Transfer: Supervision/safety;Ambulation;Comfort height toilet;RW Armed forces technical officer Details (indicate cue type and reason): pt has difficulty getting up from comfort height commode without anything to  push from. Tends to bend forward:  recommend 3:1 Toileting- Clothing Manipulation and Hygiene: Supervision/safety;Sit to/from stand   Tub/ Shower Transfer: Walk-in shower;Supervision/safety     General ADL Comments: pt got dressed using reacher.  Shoes were pretied.  Pt is able to cross LLE over R but cannot do this on R side     Vision         Perception     Praxis      Pertinent Vitals/Pain Pain Assessment: 0-10 Pain Score: 1  Pain Location: back Pain Descriptors / Indicators: Sore Pain Intervention(s): Limited activity within patient's tolerance;Monitored during session;Premedicated before session;Repositioned     Hand Dominance     Extremity/Trunk Assessment Upper Extremity Assessment Upper Extremity Assessment: Overall WFL for tasks assessed           Communication Communication Communication: No difficulties   Cognition Arousal/Alertness: Awake/alert Behavior During Therapy: WFL for tasks assessed/performed Overall Cognitive Status: Within Functional Limits for tasks assessed                                     General Comments       Exercises     Shoulder Instructions      Home Living Family/patient expects to be discharged to::  Private residence Living Arrangements: [place] Shower/Tub: [place]: Handicapped height     Home Equipment: Environmental consultant - 2 wheels;Cane - single point;Hand held shower head;Shower seat          Prior Functioning/Environment Level of Independence: Independent                 OT Problem List:        OT Treatment/Interventions:      OT Goals(Current goals can be found in the care plan section) Acute Rehab OT Goals Patient Stated Goal: go home, be independent OT Goal Formulation: All assessment and education complete, DC therapy  OT Frequency:     Barriers to D/C:            Co-evaluation              AM-PAC OT "6 Clicks" Daily  Activity     Outcome Measure Help from another person eating meals?: None Help from another person taking care of personal grooming?: A Little Help from another person toileting, which includes using toliet, bedpan, or urinal?: A Little Help from another person bathing (including washing, rinsing, drying)?: A Little Help from another person to put on and taking off regular upper body clothing?: A Little Help from another person to put on and taking off regular lower body clothing?: A Little 6 Click Score: 19   End of Session    Activity Tolerance: Patient tolerated treatment well Patient left: in chair;with call bell/phone within reach;with family/visitor present  OT Visit Diagnosis: Muscle weakness (generalized) (M62.81)                Time: 2409-7353 OT Time Calculation (min): 35 min Charges:  OT General Charges $OT Visit: 1 Visit OT Evaluation $OT Eval Low Complexity: 1 Low OT Treatments $Self Care/Home Management : 8-22 mins  Lesle Chris, OTR/L Acute Rehabilitation Services [phone] [facility] pager [phone] office 02/11/2018  [place] 02/11/2018, 9:33 AM

## 2018-02-11 NOTE — Progress Notes (Signed)
Subjective: 1 Day Post-Op Procedure(s) (LRB): L2-3, L3-4 and L4-5 complete decompressive lumbar laminectomies for spinal stenosis, Foraminotomies  for L3, L4, L5 nerve roots. (N/A) Patient reports pain as 0 on 0-10 scale.Doing very well today.Neuro intact.  He said that he does not have any further pain.  Objective: Vital signs in last 24 hours: Temp:  [97.1 F (36.2 C)-98.2 F (36.8 C)] 97.8 F (36.6 C) (01/30 0519) Pulse Rate:  [65-99] 65 (01/30 0519) Resp:  [13-18] 16 (01/30 0519) BP: (116-151)/(62-83) 128/65 (01/30 0519) SpO2:  [98 %-100 %] 100 % (01/30 0519)  Intake/Output from previous day: 01/29 0701 - 01/30 0700 In: 3426.3 [P.O.:320; I.V.:2856.4; IV Piggyback:249.9] Out: 2325 [Urine:2225; Blood:100] Intake/Output this shift: No intake/output data recorded.  Recent Labs    02/08/18 0805  HGB 12.5*   Recent Labs    02/08/18 0805  WBC 13.5*  RBC 3.95*  HCT 38.8*  PLT 351   Recent Labs    02/08/18 0805  NA 140  K 3.7  CL 105  CO2 25  BUN 21  CREATININE 1.25*  GLUCOSE 109*  CALCIUM 9.8   Recent Labs    02/08/18 0805  INR 1.04    Neurologically intact Dorsiflexion/Plantar flexion intact Compartment soft    Assessment/Plan: 1 Day Post-Op Procedure(s) (LRB): L2-3, L3-4 and L4-5 complete decompressive lumbar laminectomies for spinal stenosis, Foraminotomies  for L3, L4, L5 nerve roots. (N/A)  will DC home.   Latanya Maudlin 02/11/2018, 7:33 AM

## 2018-02-11 NOTE — Progress Notes (Signed)
CSW consult-SNF  Plan:Home  Kathrin Greathouse, Marlinda Mike, MSW Clinical Social Worker  [phone] 02/11/2018  9:15 AM

## 2018-02-11 NOTE — Plan of Care (Signed)
  Problem: Activity: Goal: Risk for activity intolerance will decrease Outcome: Progressing   Problem: Nutrition: Goal: Adequate nutrition will be maintained Outcome: Progressing   Problem: Coping: Goal: Level of anxiety will decrease Outcome: Progressing   Problem: Pain Managment: Goal: General experience of comfort will improve Outcome: Progressing   Problem: Safety: Goal: Ability to remain free from injury will improve Outcome: Progressing   Problem: Pain Management: Goal: Pain level will decrease Outcome: Progressing

## 2018-02-12 NOTE — Discharge Summary (Signed)
Physician Discharge Summary   [name] MRN: [id number] DOB/AGE: [birth year] 85 y.o.  Admit date: 02/10/2018 Discharge date: 02/12/2018  Primary Diagnosis:   lumbar spinal stenosis  Admission Diagnoses:  Past Medical History:  Diagnosis Date  . Anemia   . History of prostate cancer 1994  . HLD (hyperlipidemia)   . HTN (hypertension)   . PVC (premature ventricular contraction)    history of   Discharge Diagnoses:   Active Problems:   Spinal stenosis of lumbar region at multiple levels  Procedure:  Procedure(s) (LRB): L2-3, L3-4 and L4-5 complete decompressive lumbar laminectomies for spinal stenosis, Foraminotomies  for L3, L4, L5 nerve roots. (N/A)   Consults: None  HPI: The patient presented with the chief complaint of low back pain. He also developed bilateral leg pain and weakness. He did not improve with conservative measures. CT myelogram showed a severe block at L2-L3 and L4-L5.      Laboratory Data: Hospital Outpatient Visit on 02/08/2018  Component Date Value Ref Range Status  . MRSA, PCR 02/08/2018 NEGATIVE  NEGATIVE Final  . Staphylococcus aureus 02/08/2018 NEGATIVE  NEGATIVE Final   Comment: (NOTE) The Xpert SA Assay (FDA approved for NASAL specimens in patients 52 years of age and older), is one component of a comprehensive surveillance program. It is not intended to diagnose infection nor to guide or monitor treatment. Performed at [facility], [place] [address] [place], [place] [address]   . WBC 02/08/2018 13.5* 4.0 - 10.5 K/uL Final  . RBC 02/08/2018 3.95* 4.22 - 5.81 MIL/uL Final  . Hemoglobin 02/08/2018 12.5* 13.0 - 17.0 g/dL Final  . HCT 02/08/2018 38.8* 39.0 - 52.0 % Final  . MCV 02/08/2018 98.2  80.0 - 100.0 fL Final  . MCH 02/08/2018 31.6  26.0 - 34.0 pg Final  . MCHC 02/08/2018 32.2  30.0 - 36.0 g/dL Final  . RDW 02/08/2018 11.9  11.5 - 15.5 % Final  . Platelets 02/08/2018 351  150 - 400 K/uL Final  .  nRBC 02/08/2018 0.0  0.0 - 0.2 % Final  . Neutrophils Relative % 02/08/2018 84  % Final  . Neutro Abs 02/08/2018 11.4* 1.7 - 7.7 K/uL Final  . Lymphocytes Relative 02/08/2018 9  % Final  . Lymphs Abs 02/08/2018 1.3  0.7 - 4.0 K/uL Final  . Monocytes Relative 02/08/2018 5  % Final  . Monocytes Absolute 02/08/2018 0.6  0.1 - 1.0 K/uL Final  . Eosinophils Relative 02/08/2018 1  % Final  . Eosinophils Absolute 02/08/2018 0.1  0.0 - 0.5 K/uL Final  . Basophils Relative 02/08/2018 0  % Final  . Basophils Absolute 02/08/2018 0.1  0.0 - 0.1 K/uL Final  . Immature Granulocytes 02/08/2018 1  % Final  . Abs Immature Granulocytes 02/08/2018 0.08* 0.00 - 0.07 K/uL Final   Performed at [facility], [place] [address], [place], [place] [address]  . Sodium 02/08/2018 140  135 - 145 mmol/L Final  . Potassium 02/08/2018 3.7  3.5 - 5.1 mmol/L Final  . Chloride 02/08/2018 105  98 - 111 mmol/L Final  . CO2 02/08/2018 25  22 - 32 mmol/L Final  . Glucose, Bld 02/08/2018 109* 70 - 99 mg/dL Final  . BUN 02/08/2018 21  8 - 23 mg/dL Final  . Creatinine, Ser 02/08/2018 1.25* 0.61 - 1.24 mg/dL Final  . Calcium 02/08/2018 9.8  8.9 - 10.3 mg/dL Final  . Total Protein 02/08/2018 7.0  6.5 - 8.1 g/dL Final  .  Albumin 02/08/2018 4.0  3.5 - 5.0 g/dL Final  . AST 02/08/2018 25  15 - 41 U/L Final  . ALT 02/08/2018 21  0 - 44 U/L Final  . Alkaline Phosphatase 02/08/2018 47  38 - 126 U/L Final  . Total Bilirubin 02/08/2018 0.7  0.3 - 1.2 mg/dL Final  . GFR calc non Af Amer 02/08/2018 51* >60 mL/min Final  . GFR calc Af Amer 02/08/2018 60* >60 mL/min Final  . Anion gap 02/08/2018 10  5 - 15 Final   Performed at [facility], [place] [address], [place], [place] [address]  . Prothrombin Time 02/08/2018 13.5  11.4 - 15.2 seconds Final  . INR 02/08/2018 1.04   Final   Performed at [facility], [place] [address] [place], [place] [address]    X-Rays:Dg Lumbar Spine 2-3  Views  Result Date: 02/08/2018 CLINICAL DATA:  Lumbar spine pain. Bilateral leg pain. Preop. EXAM: LUMBAR SPINE - 2-3 VIEW COMPARISON:  Lumbar spine radiographs 08/24/2017 and myelogram 01/01/2018 FINDINGS: There are 5 non rib-bearing lumbar type vertebrae. Trace anterolisthesis of L5 on S1 is again noted. There is minimal right convex curvature of the lumbar spine. No acute fracture is identified. Mild disc space narrowing and moderate endplate osteophyte formation are present throughout the lumbar spine. Surgical clips are present in the pelvis bilaterally. IMPRESSION: Lumbar spondylosis without acute osseous abnormality. Electronically Signed   By: Logan Bores M.D.   On: 02/08/2018 08:38   Dg Spine Portable 1 View  Result Date: 02/10/2018 CLINICAL DATA:  Intraoperative lumbar surgery EXAM: PORTABLE SPINE - 1 VIEW COMPARISON:  None. FINDINGS: One surgical instrument projects over the L3 pedicle. A second surgical instrument projects over the posterior elements at the level of the L4 pedicle. No vertebral compression deformity. Degenerative changes throughout the lumbar spine are stable. IMPRESSION: Intraoperative localization at the L3 and L4 pedicle levels. Electronically Signed   By: Marybelle Killings M.D.   On: 02/10/2018 09:53   Dg Spine Portable 1 View  Result Date: 02/10/2018 CLINICAL DATA:  Intraoperative localization film for patient undergoing lumbar surgery. EXAM: PORTABLE SPINE - 1 VIEW COMPARISON:  Postmyelogram CT scan of the lumbar spine 01/01/2018. FINDINGS: Clamps are in place on the L2 and L3 spinous processes. Probes are at the level of the L2-3 foramina and inferior margin of the L4 pedicles. IMPRESSION: Localization as above. Electronically Signed   By: Inge Rise M.D.   On: 02/10/2018 09:36   Dg Spine Portable 1 View  Result Date: 02/10/2018 CLINICAL DATA:  Lumbar spine surgery.  Intraoperative localization. EXAM: PORTABLE SPINE - 1 VIEW COMPARISON:  Two-view lumbar spine  radiographs 02/08/2018 FINDINGS: Clamps are placed at the L2 and L3 spinous process. IMPRESSION: Intraoperative localization of L2 and L3. Electronically Signed   By: San Morelle M.D.   On: 02/10/2018 08:52   Dg Spine Portable 1 View  Result Date: 02/10/2018 CLINICAL DATA:  Intraoperative lumbar localization EXAM: PORTABLE SPINE - 1 VIEW COMPARISON:  02/08/2018 FINDINGS: Intraoperative film shows needles in the posterior soft tissues just above and just below the L2-3 interspace. Numbering nomenclature is similar to that utilized on prior plain film examination. Multilevel degenerative changes are seen. IMPRESSION: Intraoperative localization as described. Electronically Signed   By: Inez Catalina M.D.   On: 02/10/2018 08:25    EKG: Orders placed or performed in visit on 02/03/18  . EKG 12-Lead     Hospital Course: Patient was admitted to [facility]  and taken to the OR and underwent the above state procedure without complications.  Patient tolerated the procedure well and was later transferred to the recovery room and then to the orthopaedic floor for postoperative care.  They were given PO and IV analgesics for pain control following their surgery.  They were given 24 hours of postoperative antibiotics.   PT was consulted postop to assist with mobility and transfers.  The patient was allowed to be WBAT with therapy and was taught back precautions. Discharge planning was consulted to help with postop disposition and equipment needs.  Patient had a good night on the evening of surgery and started to get up OOB with therapy on day one. Patient was seen in rounds and was ready to go home on day one.  They were given discharge instructions and dressing directions.  They were instructed on when to follow up in the office with Dr.Gioffre.   Diet: Cardiac diet Activity:WBAT Follow-up:in 2 weeks Disposition - Home Discharged Condition: stable   Discharge Instructions    Call MD /  Call 911   Complete by:  As directed    If you experience chest pain or shortness of breath, CALL 911 and be transported to the hospital emergency room.  If you develope a fever above 101 F, pus (white drainage) or increased drainage or redness at the wound, or calf pain, call your surgeon's office.   Constipation Prevention   Complete by:  As directed    Drink plenty of fluids.  Prune juice may be helpful.  You may use a stool softener, such as Colace (over the counter) 100 mg twice a day.  Use MiraLax (over the counter) for constipation as needed.   Diet - low sodium heart healthy   Complete by:  As directed    Discharge instructions   Complete by:  As directed    For the first three days, remove your dressing, and tape a piece of saran wrap over your incision. Take your shower, then remove the saran wrap and put a clean dressing on. After three days you can shower without the saran wrap.  No lifting or excessive bending. No driving while taking pain medications Call Dr. Gladstone Lighter if any wound complications or temperature of 101 degrees F or over.  Call the office for an appointment to see Dr. Gladstone Lighter in two weeks: [phone] and ask for Dr. Charlestine Night nurse, Brunilda Payor.   Increase activity slowly as tolerated   Complete by:  As directed      Allergies as of 02/11/2018      Reactions   Lipitor [atorvastatin] Other (See Comments)   myalgia      Medication List    TAKE these medications   acetaminophen 325 MG tablet Commonly known as:  TYLENOL Take 325 mg by mouth 2 (two) times daily as needed for moderate pain.   amLODipine 5 MG tablet Commonly known as:  NORVASC TAKE 1 TABLET BY MOUTH EVERY DAY What changed:  when to take this   aspirin 81 MG tablet Take 81 mg by mouth every Monday, Wednesday, and Friday.   benazepril 20 MG tablet Commonly known as:  LOTENSIN TAKE 1 TABLET EVERY DAY What changed:  when to take this   Cholecalciferol 25 MCG (1000 UT) capsule Take  1,000 Units by mouth daily.   fenofibrate 54 MG tablet TAKE 1 TABLET BY MOUTH EVERY DAY   fish oil-omega-3 fatty acids 1000 MG capsule Take 1 g by mouth daily.  glucose blood test strip Check BS QD and PRN   hydrochlorothiazide 25 MG tablet Commonly known as:  HYDRODIURIL TAKE 1 TABLET BY MOUTH EVERY DAY   INTEGRA PLUS Caps TAKE 1 CAPSULE BY MOUTH EVERY DAY What changed:  when to take this   methocarbamol 500 MG tablet Commonly known as:  ROBAXIN Take 1 tablet (500 mg total) by mouth every 8 (eight) hours as needed for muscle spasms. Notes to patient:  Muscle Relaxer   oxyCODONE-acetaminophen 5-325 MG tablet Commonly known as:  PERCOCET Take 1 tablet by mouth every 4 (four) hours as needed for up to 7 days for severe pain.   rosuvastatin 5 MG tablet Commonly known as:  CRESTOR Take 5 mg by mouth every Monday, Wednesday, and Friday.   tamsulosin 0.4 MG Caps capsule Commonly known as:  FLOMAX Take 1 capsule (0.4 mg total) by mouth daily.   vitamin C 500 MG tablet Commonly known as:  ASCORBIC ACID Take 500 mg by mouth 2 (two) times daily.      Follow-up Information    Latanya Maudlin, MD. Schedule an appointment as soon as possible for a visit in 2 week(s).   Specialty:  Orthopedic Surgery Contact information: [address] [place] [address] [phone]           Signed: Ardeen Jourdain, PA-C Orthopaedic Surgery 02/12/2018, 8:17 AM

## 2018-03-17 ENCOUNTER — Other Ambulatory Visit: Payer: Self-pay | Admitting: Family Medicine

## 2018-04-21 ENCOUNTER — Other Ambulatory Visit: Payer: Self-pay

## 2018-04-21 ENCOUNTER — Ambulatory Visit (INDEPENDENT_AMBULATORY_CARE_PROVIDER_SITE_OTHER): Payer: Medicare HMO | Admitting: Family Medicine

## 2018-04-21 DIAGNOSIS — E559 Vitamin D deficiency, unspecified: Secondary | ICD-10-CM | POA: Diagnosis not present

## 2018-04-21 DIAGNOSIS — N183 Chronic kidney disease, stage 3 unspecified: Secondary | ICD-10-CM

## 2018-04-21 DIAGNOSIS — C61 Malignant neoplasm of prostate: Secondary | ICD-10-CM

## 2018-04-21 DIAGNOSIS — I700 Atherosclerosis of aorta: Secondary | ICD-10-CM | POA: Diagnosis not present

## 2018-04-21 DIAGNOSIS — M47816 Spondylosis without myelopathy or radiculopathy, lumbar region: Secondary | ICD-10-CM

## 2018-04-21 DIAGNOSIS — E7800 Pure hypercholesterolemia, unspecified: Secondary | ICD-10-CM | POA: Diagnosis not present

## 2018-04-21 DIAGNOSIS — Z9889 Other specified postprocedural states: Secondary | ICD-10-CM | POA: Diagnosis not present

## 2018-04-21 DIAGNOSIS — R69 Illness, unspecified: Secondary | ICD-10-CM | POA: Diagnosis not present

## 2018-04-21 MED ORDER — GLUCOSE BLOOD VI STRP: ORAL_STRIP | 11 refills | Status: DC

## 2018-04-21 NOTE — Progress Notes (Signed)
Virtual Visit via telephone Note I connected with@ on 04/21/18 by telephone and verified that I am speaking with the correct person or authorized healthcare agent using two identifiers. Benjamin Buchanan is currently located at home and there are no unauthorized people in close proximity. I, Redge Gainer, MD, completed this visit while in a private location in my  office.  This visit type was conducted due to national recommendations for restrictions regarding the COVID-19 Pandemic (e.g. social distancing).  This format is felt to be most appropriate for this patient at this time.  All issues noted in this document were discussed and addressed.  No physical exam was performed.    I discussed the limitations, risks, security and privacy concerns of performing an evaluation and management service by telephone and the availability of in person appointments. I also discussed with the patient that there may be a patient responsible charge related to this service. The patient expressed understanding and agreed to proceed.   Date:  04/21/2018    ID:  Erlene Senters      08/05/1928        [id number]   Patient Care Team Patient Care Team: Chipper Herb, MD as PCP - General (Family Medicine) Larey Dresser, MD as Consulting Physician (Cardiology)  Reason for Visit: Primary Care Follow-up     History of Present Illness & Review of Systems:     Benjamin Buchanan is a 85 y.o. year old male primary care patient that presents today for a telehealth visit.  The patient is alert and doing well.  He is thankful he has had the back surgery which caused very little problems with after surgical pain.  He is doing much better.  He still has some discomfort around his waistline in the mornings when he gets up but taking a Tylenol seems to relieve this.  He did have some blood work in the hospital and this was reviewed and his electrolytes were good his creatinine was only slightly elevated at  1.25.  Liver function tests were good and a CBC had a white blood cell count that was slightly elevated and a hemoglobin that was stable for him at 12.5 and he will continue to take his Integra.  The platelet count was adequate.  The patient was at his home when this visit occurred.  He was instructed closely about hand hygiene and pulmonary hygiene.  He did not need refills on any of his medicines.  Other than his test strips.  Review of systems as stated otherwise negative for body systems left unmentioned.   The patient does not have symptoms concerning for COVID-19 infection (fever, chills, cough, or new shortness of breath).      Current Medications (Verified) Allergies as of 04/21/2018      Reactions   Lipitor [atorvastatin] Other (See Comments)   myalgia      Medication List       Accurate as of April 21, 2018  7:52 AM. Always use your most recent med list.        acetaminophen 325 MG tablet Commonly known as:  TYLENOL Take 325 mg by mouth 2 (two) times daily as needed for moderate pain.   amLODipine 5 MG tablet Commonly known as:  NORVASC TAKE 1 TABLET BY MOUTH EVERY DAY   aspirin 81 MG tablet Take 81 mg by mouth every Monday, Wednesday, and Friday.   benazepril 20 MG tablet Commonly known as:  LOTENSIN TAKE  1 TABLET EVERY DAY   Cholecalciferol 25 MCG (1000 UT) capsule Take 1,000 Units by mouth daily.   fenofibrate 54 MG tablet TAKE 1 TABLET BY MOUTH EVERY DAY   fish oil-omega-3 fatty acids 1000 MG capsule Take 1 g by mouth daily.   glucose blood test strip Check BS QD and PRN   hydrochlorothiazide 25 MG tablet Commonly known as:  HYDRODIURIL TAKE 1 TABLET BY MOUTH EVERY DAY   Integra Plus Caps TAKE 1 CAPSULE BY MOUTH EVERY DAY   methocarbamol 500 MG tablet Commonly known as:  Robaxin Take 1 tablet (500 mg total) by mouth every 8 (eight) hours as needed for muscle spasms.   rosuvastatin 5 MG tablet Commonly known as:  CRESTOR Take 5 mg by mouth every  Monday, Wednesday, and Friday.   tamsulosin 0.4 MG Caps capsule Commonly known as:  Flomax Take 1 capsule (0.4 mg total) by mouth daily.   vitamin C 500 MG tablet Commonly known as:  ASCORBIC ACID Take 500 mg by mouth 2 (two) times daily.           Allergies (Verified)    Lipitor [atorvastatin]  Past Medical History Past Medical History:  Diagnosis Date  . Anemia   . History of prostate cancer 1994  . HLD (hyperlipidemia)   . HTN (hypertension)   . PVC (premature ventricular contraction)    history of     Past Surgical History:  Procedure Laterality Date  . HERNIA REPAIR    . INGUINAL HERNIA REPAIR Left 10/23/2016   Procedure: LEFT INGUINAL HERNIA REPAIR WITH MESH  ;  Surgeon: Rolm Bookbinder, MD;  Location: [place];  Service: General;  Laterality: Left;  . INSERTION OF MESH Left 10/23/2016   Procedure: INSERTION OF MESH;  Surgeon: Rolm Bookbinder, MD;  Location: [place];  Service: General;  Laterality: Left;  . LUMBAR LAMINECTOMY/DECOMPRESSION MICRODISCECTOMY N/A 02/10/2018   Procedure: L2-3, L3-4 and L4-5 complete decompressive lumbar laminectomies for spinal stenosis, Foraminotomies  for L3, L4, L5 nerve roots.;  Surgeon: Latanya Maudlin, MD;  Location: [facility];  Service: Orthopedics;  Laterality: N/A;  145min  . PROSTATE SURGERY      Social History   Socioeconomic History  . Marital status: Widowed    Spouse name: Not on file  . Number of children: 2  . Years of education: Not on file  . Highest education level: Not on file  Occupational History  . Not on file  Social Needs  . Financial resource strain: Not on file  . Food insecurity:    Worry: Not on file    Inability: Not on file  . Transportation needs:    Medical: Not on file    Non-medical: Not on file  Tobacco Use  . Smoking status: Never Smoker  . Smokeless tobacco: Never Used  Substance and Sexual Activity  . Alcohol use: No  . Drug use: No  . Sexual  activity: Never  Lifestyle  . Physical activity:    Days per week: Not on file    Minutes per session: Not on file  . Stress: Not on file  Relationships  . Social connections:    Talks on phone: Not on file    Gets together: Not on file    Attends religious service: Not on file    Active member of club or organization: Not on file    Attends meetings of clubs or organizations: Not on file    Relationship status: Not  on file  Other Topics Concern  . Not on file  Social History Narrative   Retired      Family History  Problem Relation Age of Onset  . Cancer Father        prostate cancer  . Hypertension Other        family history      Labs/Other Tests and Data Reviewed:    Wt Readings from Last 3 Encounters:  02/10/18 151 lb (68.5 kg)  02/08/18 151 lb (68.5 kg)  02/03/18 153 lb (69.4 kg)   Temp Readings from Last 3 Encounters:  02/11/18 (!) 97.5 F (36.4 C) (Oral)  02/08/18 98.3 F (36.8 C) (Oral)  10/19/17 (!) 97 F (36.1 C) (Oral)   BP Readings from Last 3 Encounters:  02/11/18 (!) 143/70  02/08/18 (!) 167/88  02/03/18 136/82   Pulse Readings from Last 3 Encounters:  02/11/18 80  02/08/18 85  02/03/18 98     No results found for: HGBA1C Lab Results  Component Value Date   LDLCALC 59 10/19/2017   CREATININE 1.25 (H) 02/08/2018       Chemistry      Component Value Date/Time   NA 140 02/08/2018 0805   NA 143 10/19/2017 0827   K 3.7 02/08/2018 0805   CL 105 02/08/2018 0805   CO2 25 02/08/2018 0805   BUN 21 02/08/2018 0805   BUN 19 10/19/2017 0827   CREATININE 1.25 (H) 02/08/2018 0805      Component Value Date/Time   CALCIUM 9.8 02/08/2018 0805   ALKPHOS 47 02/08/2018 0805   AST 25 02/08/2018 0805   ALT 21 02/08/2018 0805   BILITOT 0.7 02/08/2018 0805   BILITOT 0.4 10/19/2017 0827         OBSERVATIONS/ OBJECTIVE:     The patient was pleasant and alert and had minimal complaints today.  He seems to be recovering from his laminectomy  by the orthopedic surgeon and does have to go back and see him again.  He denies any chest pain pressure tightness or shortness of breath.  He denies any trouble with swallowing heartburn indigestion nausea vomiting diarrhea blood in the stool black tarry bowel movements or change in bowel habits.  He is passing his water well and has had his prostate removed and does not have to go back and see the urologist.  He has nocturia 1-2 times.  His recent lab work was reviewed with him.  He routinely comes back to the office about every 4 months and we will plan to see him back again in 4 months.  He did indicate that he checks his blood pressures at home and they run between 116/84 and a more recent one was 133/72.  His blood sugars at home run between 110 and 112.  His current weight is about 141.  Physical exam deferred due to nature of telephonic visit.  ASSESSMENT & PLAN    Time:   Today, I have spent 25 minutes with the patient via telephone discussing the above including Covid precautions.     Visit Diagnoses: 1. Pure hypercholesterolemia -Continue with Crestor and as aggressive therapeutic lifestyle changes as possible  2. Vitamin D deficiency -Continue with vitamin D replacement  3. Prostate cancer [facility]) -He is doing well with this and has had a prostatectomy and does not go back to see the urologist anymore.  4. Thoracic aortic atherosclerosis ([place]) -He will continue with aggressive therapeutic lifestyle changes and his current statin drug and fenofibrate.  5. Spondylosis of lumbar region without myelopathy or radiculopathy -Patient had a lumbar laminectomy and is in much less pain.  He has some discomfort when he gets up in the morning and Tylenol regular strength seems to relieve this.  He did ask me about taking oxycodone and I told him it would be better to take 2 regular strength Tylenol instead of taking the oxycodone and he understood this.  6. Stage 3 chronic kidney disease (HCC)  -Creatinine remained stable and only slightly elevated.  7. Status post lumbar laminectomy -Follow-up with orthopedics as planned     Follow-up Instructions:  Patient Instructions  The patient should continue with his current treatment regimen and follow as aggressive therapeutic lifestyle changes as possible He should avoid crowds of people and wear protective equipment when going to the grocery store or the pharmacy and wash his hands regularly to promote good hand hygiene and good respiratory hygiene He should drink plenty of water and fluids He should follow-up with orthopedic surgeon as needed   Continue current medications. Continue good therapeutic lifestyle changes which include good diet and exercise. Fall precautions discussed with patient. If an FOBT was given today- please return it to our front desk. If you are over 17 years old - you may need Prevnar 27 or the adult Pneumonia vaccine.  **Flu shots are available--- please call and schedule a FLU-CLINIC appointment**  After your visit with [place] today you will receive a survey in the mail or online from [employer] regarding your care with [place]. Please take a moment to fill this out. Your feedback is very important to [place] as you can help [place] better understand your patient needs as well as improve your experience and satisfaction. WE CARE ABOUT YOU!!!     I discussed the assessment and treatment plan with the patient. The patient was provided an opportunity to ask questions and all were answered. The patient agreed with the plan and demonstrated an understanding of the instructions.   The patient was advised to call back or seek an in-person evaluation if the symptoms worsen or if the condition fails to improve as anticipated.  The above assessment and management plan was discussed with the patient. The patient verbalized understanding of and has agreed to the management plan. Patient is aware to call the clinic if symptoms  persist or worsen. Patient is aware when to return to the clinic for a follow-up visit. Patient educated on when it is appropriate to go to the emergency department.    Chipper Herb, MD [address], [place], [place] [address] Ph [phone]   Arrie Senate MD

## 2018-04-21 NOTE — Patient Instructions (Signed)
The patient should continue with his current treatment regimen and follow as aggressive therapeutic lifestyle changes as possible He should avoid crowds of people and wear protective equipment when going to the grocery store or the pharmacy and wash his hands regularly to promote good hand hygiene and good respiratory hygiene He should drink plenty of water and fluids He should follow-up with orthopedic surgeon as needed

## 2018-04-21 NOTE — Addendum Note (Signed)
Addended by: Zannie Cove on: 04/21/2018 09:53 AM   Modules accepted: Orders

## 2018-04-27 ENCOUNTER — Other Ambulatory Visit: Payer: Self-pay | Admitting: Family Medicine

## 2018-05-16 ENCOUNTER — Other Ambulatory Visit: Payer: Self-pay | Admitting: Family Medicine

## 2018-05-20 ENCOUNTER — Other Ambulatory Visit: Payer: Self-pay | Admitting: Family Medicine

## 2018-06-11 ENCOUNTER — Other Ambulatory Visit: Payer: Self-pay | Admitting: Family Medicine

## 2018-07-06 DIAGNOSIS — M5440 Lumbago with sciatica, unspecified side: Secondary | ICD-10-CM | POA: Diagnosis not present

## 2018-07-21 ENCOUNTER — Other Ambulatory Visit: Payer: Self-pay | Admitting: Family Medicine

## 2018-08-11 ENCOUNTER — Other Ambulatory Visit: Payer: Self-pay | Admitting: Family Medicine

## 2018-09-10 ENCOUNTER — Telehealth (INDEPENDENT_AMBULATORY_CARE_PROVIDER_SITE_OTHER): Payer: Medicare HMO | Admitting: Internal Medicine

## 2018-09-10 ENCOUNTER — Encounter: Payer: Self-pay | Admitting: Internal Medicine

## 2018-09-10 VITALS — BP 130/77 | HR 68 | Ht 71.0 in | Wt 141.0 lb

## 2018-09-10 DIAGNOSIS — I447 Left bundle-branch block, unspecified: Secondary | ICD-10-CM

## 2018-09-10 DIAGNOSIS — I10 Essential (primary) hypertension: Secondary | ICD-10-CM

## 2018-09-10 DIAGNOSIS — E785 Hyperlipidemia, unspecified: Secondary | ICD-10-CM

## 2018-09-10 NOTE — Patient Instructions (Signed)
Medication Instructions:  Your physician recommends that you continue on your current medications as directed. Please refer to the Current Medication list given to you today.  If you need a refill on your cardiac medications before your next appointment, please call your pharmacy.    Follow-Up: At [facility], you and your health needs are our priority.  As part of our continuing mission to provide you with exceptional heart care, we have created designated Provider Care Teams.  These Care Teams include your primary Cardiologist (physician) and Advanced Practice Providers (APPs -  Physician Assistants and Nurse Practitioners) who all work together to provide you with the care you need, when you need it. You will need a follow up appointment in 12 months.  Please call our office 2 months in advance to schedule this appointment.  You may see Dr. Hilty or one of the following Advanced Practice Providers on your designated Care Team: Hao Meng, PA-C . Angela Duke, PA-C    

## 2018-09-10 NOTE — Progress Notes (Signed)
Virtual Visit via Telephone Note   This visit type was conducted due to national recommendations for restrictions regarding the COVID-19 Pandemic (e.g. social distancing) in an effort to limit this patient's exposure and mitigate transmission in our community.  Due to his co-morbid illnesses, this patient is at least at moderate risk for complications without adequate follow up.  This format is felt to be most appropriate for this patient at this time.  The patient did not have access to video technology/had technical difficulties with video requiring transitioning to audio format only (telephone).  All issues noted in this document were discussed and addressed.  No physical exam could be performed with this format.  Please refer to the patient's chart for his  consent to telehealth for [facility].   Evaluation Performed:  Telephone visit  Date:  09/10/2018   ID:  Benjamin Buchanan, DOB 06-17-1928, MRN [id number]  Patient Location:  [place] [address]  Provider location:   [address] [place], [place] [address]  PCP:  No primary care provider on file.  Cardiologist:  No primary care provider on file. Electrophysiologist:  None   Chief Complaint:  No complaints  History of Present Illness:    Benjamin Buchanan is a 85 y.o. male who presents via audio/video conferencing for a telehealth visit today.  This is a pleasant 85 year old male I first saw on 2018.  He had been remotely seen by Dr. Aundra Dubin and has a history of left bundle branch block, hypertension and dyslipidemia.  He was in need of a hernia repair which he ultimately had successfully.  Stress testing at that time demonstrated apical hypokinesis with a fixed inferoseptal apical septal and apical defect suggesting possible small prior infarct.  There was no reversible ischemia.  He was asymptomatic and remains asymptomatic.  Subsequently was seen by Almyra Deforest, PA-C for cardiac clearance regarding  lumbar surgery.  He was cleared and successfully had surgery in January 2020 -since then he has had no more back pain.  He remains asymptomatic from a cardiac standpoint denies chest pain or worsening shortness of breath with exertion.  The patient does not have symptoms concerning for COVID-19 infection (fever, chills, cough, or new SHORTNESS OF BREATH).     Prior CV studies:   The following studies were reviewed today:  Chart reviewed  PMHx:  Past Medical History:  Diagnosis Date   Anemia    History of prostate cancer 1994   HLD (hyperlipidemia)    HTN (hypertension)    PVC (premature ventricular contraction)    history of    Past Surgical History:  Procedure Laterality Date   HERNIA REPAIR     INGUINAL HERNIA REPAIR Left 10/23/2016   Procedure: LEFT INGUINAL HERNIA REPAIR WITH MESH  ;  Surgeon: Rolm Bookbinder, MD;  Location: [place];  Service: General;  Laterality: Left;   INSERTION OF MESH Left 10/23/2016   Procedure: INSERTION OF MESH;  Surgeon: Rolm Bookbinder, MD;  Location: [place];  Service: General;  Laterality: Left;   LUMBAR LAMINECTOMY/DECOMPRESSION MICRODISCECTOMY N/A 02/10/2018   Procedure: L2-3, L3-4 and L4-5 complete decompressive lumbar laminectomies for spinal stenosis, Foraminotomies  for L3, L4, L5 nerve roots.;  Surgeon: Latanya Maudlin, MD;  Location: [facility];  Service: Orthopedics;  Laterality: N/A;  130min   PROSTATE SURGERY      FAMHx:  Family History  Problem Relation Age of Onset   Cancer Father  prostate cancer   Hypertension Other        family history    SOCHx:   reports that he has never smoked. He has never used smokeless tobacco. He reports that he does not drink alcohol or use drugs.  ALLERGIES:  Allergies  Allergen Reactions   Lipitor [Atorvastatin] Other (See Comments)    myalgia    MEDS:  Current Meds  Medication Sig   acetaminophen (TYLENOL) 325 MG tablet Take  325 mg by mouth 2 (two) times daily as needed for moderate pain.   amLODipine (NORVASC) 5 MG tablet TAKE 1 TABLET BY MOUTH EVERY DAY   aspirin 81 MG tablet Take 81 mg by mouth every Monday, Wednesday, and Friday.    benazepril (LOTENSIN) 20 MG tablet TAKE 1 TABLET BY MOUTH EVERY DAY   Cholecalciferol 1000 UNITS capsule Take 1,000 Units by mouth daily.   FeFum-FePoly-FA-B Cmp-C-Biot (INTEGRA PLUS) CAPS TAKE 1 CAPSULE BY MOUTH EVERY DAY   fenofibrate 54 MG tablet TAKE 1 TABLET BY MOUTH EVERY DAY   fish oil-omega-3 fatty acids 1000 MG capsule Take 1 g by mouth daily.   glucose blood test strip Check BS QD and PRN   hydrochlorothiazide (HYDRODIURIL) 25 MG tablet TAKE 1 TABLET BY MOUTH EVERY DAY   rosuvastatin (CRESTOR) 5 MG tablet TAKE 1 TABLET BY MOUTH EVERY DAY IN THE EVENING   vitamin C (ASCORBIC ACID) 500 MG tablet Take 500 mg by mouth 2 (two) times daily.      ROS: Pertinent items noted in HPI and remainder of comprehensive ROS otherwise negative.  Labs/Other Tests and Data Reviewed:    Recent Labs: 02/08/2018: ALT 21; BUN 21; Creatinine, Ser 1.25; Hemoglobin 12.5; Platelets 351; Potassium 3.7; Sodium 140   Recent Lipid Panel Lab Results  Component Value Date/Time   CHOL 146 10/19/2017 08:27 AM   TRIG 155 (H) 10/19/2017 08:27 AM   TRIG 93 02/15/2015 08:28 AM   HDL 56 10/19/2017 08:27 AM   HDL 55 02/15/2015 08:28 AM   CHOLHDL 2.6 10/19/2017 08:27 AM   LDLCALC 59 10/19/2017 08:27 AM   LDLCALC 90 07/18/2013 08:06 AM    Wt Readings from Last 3 Encounters:  09/10/18 141 lb (64 kg)  02/10/18 151 lb (68.5 kg)  02/08/18 151 lb (68.5 kg)     Exam:    Vital Signs:  BP 130/77    Pulse 68    Ht 5\' 11"  (1.803 m)    Wt 141 lb (64 kg)    BMI 19.67 kg/m    Exam not performed due to telephone visit  ASSESSMENT & PLAN:    1. LBBB - small fixed apical/apical septal infarct, no ischemia by myoview (2018) 2. Hypertension 3. Dyslipidemia  [name] may have had some  coronary disease or possible artifactual findings on a Myoview stress test with a small fixed apical and apical septal defect.  He also has hypertension and dyslipidemia which are well managed.  He has no chest pain.  He continues to do well and has undergone 2 surgeries without incident over the past couple of years.  No changes were made to his medicines today.  He is transitioning to a new primary care provider since his doctor retired.  Plan follow-up with me annually or sooner as necessary.  COVID-19 Education: The signs and symptoms of COVID-19 were discussed with the patient and how to seek care for testing (follow up with PCP or arrange E-visit).  The importance of social distancing was discussed  today.  Patient Risk:   After full review of this patients clinical status, I feel that they are at least moderate risk at this time.  Time:   Today, I have spent 25 minutes with the patient with telehealth technology discussing hypertension, dyslipidemia, abnormal Myoview, left bundle branch block.     Medication Adjustments/Labs and Tests Ordered: Current medicines are reviewed at length with the patient today.  Concerns regarding medicines are outlined above.   Tests Ordered: No orders of the defined types were placed in this encounter.   Medication Changes: No orders of the defined types were placed in this encounter.   Disposition:  in 1 year(s)  Pixie Casino, MD, [facility], [place] Director of the Advanced Lipid Disorders &  Cardiovascular Risk Reduction Clinic Diplomate of the American Board of Clinical Lipidology Attending Cardiologist  Direct Dial: [phone]   Fax: [phone]  Website:  [email or web address]  Pixie Casino, MD  09/10/2018 8:31 AM

## 2018-10-06 DIAGNOSIS — R69 Illness, unspecified: Secondary | ICD-10-CM | POA: Diagnosis not present

## 2018-10-29 ENCOUNTER — Ambulatory Visit (INDEPENDENT_AMBULATORY_CARE_PROVIDER_SITE_OTHER): Payer: Medicare HMO

## 2018-10-29 ENCOUNTER — Other Ambulatory Visit: Payer: Self-pay

## 2018-10-29 DIAGNOSIS — Z23 Encounter for immunization: Secondary | ICD-10-CM

## 2018-11-12 ENCOUNTER — Other Ambulatory Visit: Payer: Self-pay | Admitting: Family Medicine

## 2018-11-19 ENCOUNTER — Other Ambulatory Visit: Payer: Self-pay

## 2018-11-20 ENCOUNTER — Other Ambulatory Visit: Payer: Self-pay | Admitting: Physician Assistant

## 2018-11-22 ENCOUNTER — Ambulatory Visit (INDEPENDENT_AMBULATORY_CARE_PROVIDER_SITE_OTHER): Payer: Medicare HMO | Admitting: Family Medicine

## 2018-11-22 ENCOUNTER — Other Ambulatory Visit: Payer: Self-pay | Admitting: Family Medicine

## 2018-11-22 ENCOUNTER — Encounter: Payer: Self-pay | Admitting: Family Medicine

## 2018-11-22 ENCOUNTER — Other Ambulatory Visit: Payer: Self-pay

## 2018-11-22 VITALS — BP 137/63 | HR 74 | Temp 98.0°F | Resp 20 | Ht 71.0 in | Wt 141.0 lb

## 2018-11-22 DIAGNOSIS — M5137 Other intervertebral disc degeneration, lumbosacral region: Secondary | ICD-10-CM

## 2018-11-22 DIAGNOSIS — D508 Other iron deficiency anemias: Secondary | ICD-10-CM

## 2018-11-22 DIAGNOSIS — N1831 Chronic kidney disease, stage 3a: Secondary | ICD-10-CM | POA: Insufficient documentation

## 2018-11-22 DIAGNOSIS — E7800 Pure hypercholesterolemia, unspecified: Secondary | ICD-10-CM | POA: Diagnosis not present

## 2018-11-22 DIAGNOSIS — E559 Vitamin D deficiency, unspecified: Secondary | ICD-10-CM

## 2018-11-22 DIAGNOSIS — I10 Essential (primary) hypertension: Secondary | ICD-10-CM

## 2018-11-22 DIAGNOSIS — I700 Atherosclerosis of aorta: Secondary | ICD-10-CM

## 2018-11-22 DIAGNOSIS — D649 Anemia, unspecified: Secondary | ICD-10-CM | POA: Insufficient documentation

## 2018-11-22 DIAGNOSIS — H6123 Impacted cerumen, bilateral: Secondary | ICD-10-CM | POA: Diagnosis not present

## 2018-11-22 NOTE — Patient Instructions (Addendum)
Earwax Buildup, Adult The ears produce a substance called earwax that helps keep bacteria out of the ear and protects the skin in the ear canal. Occasionally, earwax can build up in the ear and cause discomfort or hearing loss. What increases the risk? This condition is more likely to develop in people who:  Are male.  Are elderly.  Naturally produce more earwax.  Clean their ears often with cotton swabs.  Use earplugs often.  Use in-ear headphones often.  Wear hearing aids.  Have narrow ear canals.  Have earwax that is overly thick or sticky.  Have eczema.  Are dehydrated.  Have excess hair in the ear canal. What are the signs or symptoms? Symptoms of this condition include:  Reduced or muffled hearing.  A feeling of fullness in the ear or feeling that the ear is plugged.  Fluid coming from the ear.  Ear pain.  Ear itch.  Ringing in the ear.  Coughing.  An obvious piece of earwax that can be seen inside the ear canal. How is this diagnosed? This condition may be diagnosed based on:  Your symptoms.  Your medical history.  An ear exam. During the exam, your health care provider will look into your ear with an instrument called an otoscope. You may have tests, including a hearing test. How is this treated? This condition may be treated by:  Using ear drops to soften the earwax.  Having the earwax removed by a health care provider. The health care provider may: ? Flush the ear with water. ? Use an instrument that has a loop on the end (curette). ? Use a suction device.  Surgery to remove the wax buildup. This may be done in severe cases. Follow these instructions at home:   Take over-the-counter and prescription medicines only as told by your health care provider.  Do not put any objects, including cotton swabs, into your ear. You can clean the opening of your ear canal with a washcloth or facial tissue.  Follow instructions from your health care  provider about cleaning your ears. Do not over-clean your ears.  Drink enough fluid to keep your urine clear or pale yellow. This will help to thin the earwax.  Keep all follow-up visits as told by your health care provider. If earwax builds up in your ears often or if you use hearing aids, consider seeing your health care provider for routine, preventive ear cleanings. Ask your health care provider how often you should schedule your cleanings.  If you have hearing aids, clean them according to instructions from the manufacturer and your health care provider. Contact a health care provider if:  You have ear pain.  You develop a fever.  You have blood, pus, or other fluid coming from your ear.  You have hearing loss.  You have ringing in your ears that does not go away.  Your symptoms do not improve with treatment.  You feel like the room is spinning (vertigo). Summary  Earwax can build up in the ear and cause discomfort or hearing loss.  The most common symptoms of this condition include reduced or muffled hearing and a feeling of fullness in the ear or feeling that the ear is plugged.  This condition may be diagnosed based on your symptoms, your medical history, and an ear exam.  This condition may be treated by using ear drops to soften the earwax or by having the earwax removed by a health care provider.  Do not put any   objects, including cotton swabs, into your ear. You can clean the opening of your ear canal with a washcloth or facial tissue. This information is not intended to replace advice given to you by your health care provider. Make sure you discuss any questions you have with your health care provider. Document Released: 02/07/2004 Document Revised: 12/12/2016 Document Reviewed: 03/12/2016 Elsevier Patient Education  2020 [employer]  

## 2018-11-22 NOTE — Progress Notes (Signed)
Subjective:  [name], male    DOB: [birth year], 85 y.o.   MRN: [id number]  Patient Care Team: Baruch Gouty, FNP as PCP - General (Family Medicine) Larey Dresser, MD as Consulting Physician (Cardiology)   Chief Complaint:  Medical Management of Chronic Issues (6 mo ), Hypertension, and Hyperlipidemia   HPI: Benjamin Buchanan is a 85 y.o. male presenting on 11/22/2018 for Medical Management of Chronic Issues (6 mo ), Hypertension, and Hyperlipidemia    1. Essential hypertension Compliant with medications. No weakness, confusion, leg swelling, headaches, shortness of breath, or palpitations. No side effects from medications.   2. Pure hypercholesterolemia Does watch diet. Is active daily. Compliant with medications without associated side effects.   3. Thoracic aortic atherosclerosis (HCC) On ASA and statin therapy. No chest pain, shortness of breath, or palpitations.   4. Vitamin D deficiency Pt is taking oral repletion therapy. Denies bone pain and tenderness, muscle weakness, fracture, and difficulty walking. Lab Results  Component Value Date   VD25OH 41.7 10/19/2017   VD25OH 44.0 04/17/2017   VD25OH 49.3 11/13/2016   Lab Results  Component Value Date   CALCIUM 9.8 02/08/2018    5. Degeneration of lumbosacral intervertebral disc Ongoing lower back pain and stiffness with radiation into lower legs at times. States this is worse when he is walking but gets better as he is active. States the pain is relieved when sitting. He does take Tylenol once or twice daily as needed for the pain and this is very beneficial.   6. Other iron deficiency anemia Does take iron repletion therapy and tolerates well. No abnormal bleeding, melena, or hematochezia.   7. Stage 3a chronic kidney disease No anuria or decreased urination. No confusion, weakness, edema, or fatigue.    Relevant past medical, surgical, family, and social history reviewed and updated  as indicated.  Allergies and medications reviewed and updated. Date reviewed: Chart in Epic.   Past Medical History:  Diagnosis Date  . Anemia   . History of prostate cancer 1994  . HLD (hyperlipidemia)   . HTN (hypertension)   . PVC (premature ventricular contraction)    history of    Past Surgical History:  Procedure Laterality Date  . HERNIA REPAIR    . INGUINAL HERNIA REPAIR Left 10/23/2016   Procedure: LEFT INGUINAL HERNIA REPAIR WITH MESH  ;  Surgeon: Rolm Bookbinder, MD;  Location: [facility];  Service: General;  Laterality: Left;  . INSERTION OF MESH Left 10/23/2016   Procedure: INSERTION OF MESH;  Surgeon: Rolm Bookbinder, MD;  Location: [facility];  Service: General;  Laterality: Left;  . LUMBAR LAMINECTOMY/DECOMPRESSION MICRODISCECTOMY N/A 02/10/2018   Procedure: L2-3, L3-4 and L4-5 complete decompressive lumbar laminectomies for spinal stenosis, Foraminotomies  for L3, L4, L5 nerve roots.;  Surgeon: Latanya Maudlin, MD;  Location: [facility];  Service: Orthopedics;  Laterality: N/A;  176mn  . PROSTATE SURGERY      Social History   Socioeconomic History  . Marital status: Widowed    Spouse name: Not on file  . Number of children: 2  . Years of education: Not on file  . Highest education level: Not on file  Occupational History  . Not on file  Social Needs  . Financial resource strain: Not on file  . Food insecurity    Worry: Not on file    Inability: Not on file  . Transportation needs    Medical: Not  on file    Non-medical: Not on file  Tobacco Use  . Smoking status: Never Smoker  . Smokeless tobacco: Never Used  Substance and Sexual Activity  . Alcohol use: No  . Drug use: No  . Sexual activity: Never  Lifestyle  . Physical activity    Days per week: Not on file    Minutes per session: Not on file  . Stress: Not on file  Relationships  . Social Herbalist on phone: Not on file    Gets together: Not on  file    Attends religious service: Not on file    Active member of club or organization: Not on file    Attends meetings of clubs or organizations: Not on file    Relationship status: Not on file  . Intimate partner violence    Fear of current or ex partner: Not on file    Emotionally abused: Not on file    Physically abused: Not on file    Forced sexual activity: Not on file  Other Topics Concern  . Not on file  Social History Narrative   Retired     Outpatient Encounter Medications as of 11/22/2018  Medication Sig  . acetaminophen (TYLENOL) 325 MG tablet Take 325 mg by mouth 2 (two) times daily as needed for moderate pain.  Marland Kitchen amLODipine (NORVASC) 5 MG tablet TAKE 1 TABLET BY MOUTH EVERY DAY  . aspirin 81 MG tablet Take 81 mg by mouth every Monday, Wednesday, and Friday.   . benazepril (LOTENSIN) 20 MG tablet TAKE 1 TABLET BY MOUTH EVERY DAY  . Cholecalciferol 1000 UNITS capsule Take 1,000 Units by mouth daily.  Marland Kitchen FeFum-FePoly-FA-B Cmp-C-Biot (INTEGRA PLUS) CAPS TAKE 1 CAPSULE BY MOUTH EVERY DAY  . fenofibrate 54 MG tablet TAKE 1 TABLET BY MOUTH EVERY DAY  . fish oil-omega-3 fatty acids 1000 MG capsule Take 1 g by mouth daily.  Marland Kitchen glucose blood test strip Check BS QD and PRN  . hydrochlorothiazide (HYDRODIURIL) 25 MG tablet TAKE 1 TABLET BY MOUTH EVERY DAY  . rosuvastatin (CRESTOR) 5 MG tablet TAKE 1 TABLET BY MOUTH EVERY DAY IN THE EVENING  . vitamin C (ASCORBIC ACID) 500 MG tablet Take 500 mg by mouth 2 (two) times daily.    No facility-administered encounter medications on file as of 11/22/2018.     Allergies  Allergen Reactions  . Lipitor [Atorvastatin] Other (See Comments)    myalgia    Review of Systems  Constitutional: Negative for activity change, appetite change, chills, diaphoresis, fatigue, fever and unexpected weight change.  HENT: Positive for hearing loss.   Eyes: Negative.  Negative for photophobia and visual disturbance.  Respiratory: Negative for cough,  chest tightness and shortness of breath.   Cardiovascular: Negative for chest pain, palpitations and leg swelling.  Gastrointestinal: Negative for abdominal distention, abdominal pain, anal bleeding, blood in stool, constipation, diarrhea, nausea, rectal pain and vomiting.  Endocrine: Negative.  Negative for polydipsia, polyphagia and polyuria.  Genitourinary: Negative for decreased urine volume, difficulty urinating, dysuria, flank pain, frequency, hematuria and urgency.  Musculoskeletal: Positive for arthralgias and back pain. Negative for myalgias.  Skin: Negative.   Allergic/Immunologic: Negative.   Neurological: Negative for dizziness, tremors, seizures, syncope, facial asymmetry, speech difficulty, weakness, light-headedness, numbness and headaches.  Hematological: Negative.  Negative for adenopathy. Does not bruise/bleed easily.  Psychiatric/Behavioral: Negative for confusion, decreased concentration, hallucinations, sleep disturbance and suicidal ideas.  All other systems reviewed and are negative.  Objective:  BP 137/63   Pulse 74   Temp 98 F (36.7 C)   Resp 20   Ht _0  (1.803 m)   Wt 141 lb (64 kg)   SpO2 100%   BMI 19.67 kg/m    Wt Readings from Last 3 Encounters:  11/22/18 141 lb (64 kg)  09/10/18 141 lb (64 kg)  02/10/18 151 lb (68.5 kg)    Physical Exam Vitals signs and nursing note reviewed.  Constitutional:      General: He is not in acute distress.    Appearance: Normal appearance. He is well-developed and well-groomed. He is not ill-appearing, toxic-appearing or diaphoretic.  HENT:     Head: Normocephalic and atraumatic.     Jaw: There is normal jaw occlusion.     Right Ear: Decreased hearing (slightly decreased) noted. There is impacted cerumen. Tympanic membrane is not erythematous.     Left Ear: Decreased hearing (slightly decreased) noted. There is impacted cerumen. Tympanic membrane is not erythematous.     Nose: Nose normal.      Mouth/Throat:     Lips: Pink.     Mouth: Mucous membranes are moist.     Pharynx: Oropharynx is clear. Uvula midline.  Eyes:     General: Lids are normal.     Extraocular Movements: Extraocular movements intact.     Conjunctiva/sclera: Conjunctivae normal.     Pupils: Pupils are equal, round, and reactive to light.  Neck:     Musculoskeletal: Normal range of motion and neck supple.     Thyroid: No thyroid mass, thyromegaly or thyroid tenderness.     Vascular: No carotid bruit or JVD.     Trachea: Trachea and phonation normal.  Cardiovascular:     Rate and Rhythm: Normal rate and regular rhythm.     Chest Wall: PMI is not displaced.     Pulses: Normal pulses.     Heart sounds: Normal heart sounds. No murmur. No friction rub. No gallop.   Pulmonary:     Effort: Pulmonary effort is normal. No respiratory distress.     Breath sounds: Normal breath sounds. No wheezing.  Abdominal:     General: Abdomen is flat. Bowel sounds are normal. There is no distension or abdominal bruit.     Palpations: Abdomen is soft. There is no hepatomegaly or splenomegaly.     Tenderness: There is no abdominal tenderness. There is no right CVA tenderness or left CVA tenderness.     Hernia: No hernia is present.  Musculoskeletal: Normal range of motion.     Right lower leg: No edema.     Left lower leg: No edema.  Lymphadenopathy:     Cervical: No cervical adenopathy.  Skin:    General: Skin is warm and dry.     Capillary Refill: Capillary refill takes less than 2 seconds.     Coloration: Skin is not cyanotic, jaundiced or pale.     Findings: No rash.  Neurological:     General: No focal deficit present.     Mental Status: He is alert and oriented to person, place, and time.     Cranial Nerves: Cranial nerves are intact. No cranial nerve deficit.     Sensory: Sensation is intact. No sensory deficit.     Motor: Motor function is intact. No weakness.     Coordination: Coordination is intact. Coordination  normal.     Gait: Gait is intact. Gait normal.     Deep Tendon Reflexes: Reflexes  are normal and symmetric. Reflexes normal.  Psychiatric:        Attention and Perception: Attention and perception normal.        Mood and Affect: Mood and affect normal.        Speech: Speech normal.        Behavior: Behavior normal. Behavior is cooperative.        Thought Content: Thought content normal.        Cognition and Memory: Cognition and memory normal.        Judgment: Judgment normal.     Results for orders placed or performed during the hospital encounter of 02/08/18  Surgical pcr screen   Specimen: Nasal Mucosa; Nasal Swab  Result Value Ref Range   MRSA, PCR NEGATIVE NEGATIVE   Staphylococcus aureus NEGATIVE NEGATIVE  CBC WITH DIFFERENTIAL  Result Value Ref Range   WBC 13.5 (H) 4.0 - 10.5 K/uL   RBC 3.95 (L) 4.22 - 5.81 MIL/uL   Hemoglobin 12.5 (L) 13.0 - 17.0 g/dL   HCT 38.8 (L) 39.0 - 52.0 %   MCV 98.2 80.0 - 100.0 fL   MCH 31.6 26.0 - 34.0 pg   MCHC 32.2 30.0 - 36.0 g/dL   RDW 11.9 11.5 - 15.5 %   Platelets 351 150 - 400 K/uL   nRBC 0.0 0.0 - 0.2 %   Neutrophils Relative % 84 %   Neutro Abs 11.4 (H) 1.7 - 7.7 K/uL   Lymphocytes Relative 9 %   Lymphs Abs 1.3 0.7 - 4.0 K/uL   Monocytes Relative 5 %   Monocytes Absolute 0.6 0.1 - 1.0 K/uL   Eosinophils Relative 1 %   Eosinophils Absolute 0.1 0.0 - 0.5 K/uL   Basophils Relative 0 %   Basophils Absolute 0.1 0.0 - 0.1 K/uL   Immature Granulocytes 1 %   Abs Immature Granulocytes 0.08 (H) 0.00 - 0.07 K/uL  Comprehensive metabolic panel  Result Value Ref Range   Sodium 140 135 - 145 mmol/L   Potassium 3.7 3.5 - 5.1 mmol/L   Chloride 105 98 - 111 mmol/L   CO2 25 22 - 32 mmol/L   Glucose, Bld 109 (H) 70 - 99 mg/dL   BUN 21 8 - 23 mg/dL   Creatinine, Ser 1.25 (H) 0.61 - 1.24 mg/dL   Calcium 9.8 8.9 - 10.3 mg/dL   Total Protein 7.0 6.5 - 8.1 g/dL   Albumin 4.0 3.5 - 5.0 g/dL   AST 25 15 - 41 U/L   ALT 21 0 - 44 U/L    Alkaline Phosphatase 47 38 - 126 U/L   Total Bilirubin 0.7 0.3 - 1.2 mg/dL   GFR calc non Af Amer 51 (L) >60 mL/min   GFR calc Af Amer 60 (L) >60 mL/min   Anion gap 10 5 - 15  Protime-INR  Result Value Ref Range   Prothrombin Time 13.5 11.4 - 15.2 seconds   INR 1.04      Ear Cerumen Removal  Date/Time: 11/22/2018 8:42 AM Performed by: Baruch Gouty, FNP Authorized by: Baruch Gouty, FNP   Anesthesia: Local Anesthetic: none Location details: left ear and right ear Patient tolerance: patient tolerated the procedure well with no immediate complications Procedure type: irrigation  Sedation: Patient sedated: no     Pertinent labs & imaging results that were available during my care of the patient were reviewed by me and considered in my medical decision making.  Assessment & Plan:  Tra[place]e was seen today for  medical management of chronic issues, hypertension and hyperlipidemia.  Diagnoses and all orders for this visit:  Essential hypertension BP is controlled. Changes were not made in regimen today. Goal BP is 130/80. Pt aware to report any persistent high or low readings. DASH diet and exercise encouraged. Exercise at least 150 minutes per week and increase as tolerated. Goal BMI > 25. Stress management encouraged. Avoid nicotine and tobacco product use. Avoid excessive alcohol and NSAID's. Avoid more than 2000 mg of sodium daily. Medications as prescribed. Follow up as scheduled.  -     CMP14+EGFR -     CBC with Differential/Platelet -     Thyroid Panel With TSH  Pure hypercholesterolemia Diet encouraged - increase intake of fresh fruits and vegetables, increase intake of lean proteins. Bake, broil, or grill foods. Avoid fried, greasy, and fatty foods. Avoid fast foods. Increase intake of fiber-rich whole grains. Exercise encouraged - at least 150 minutes per week and advance as tolerated.  Goal BMI < 25. Continue medications as prescribed. Follow up in 3-6 months as discussed.   -     Lipid panel  Thoracic aortic atherosclerosis (HCC) Continue ASA and statin therapy.   Vitamin D deficiency Labs pending. Continue repletion therapy. If indicated, will change repletion dosage. Eat foods rich in Vit D including milk, orange juice, yogurt with vitamin D added, salmon or mackerel, canned tuna fish, cereals with vitamin D added, and cod liver oil. Get out in the sun but make sure to wear at least SPF 30 sunscreen.  -     CBC with Differential/Platelet -     Vitamin D 25 hydroxy  Degeneration of lumbosacral intervertebral disc Tolerating well. Does take tylenol as needed for the pain and stiffness. Symptomatic care discussed.   Other iron deficiency anemia Continue iron repletion therapy. Labs pending.  -     CBC with Differential/Platelet  Stage 3a chronic kidney disease Labs pending. Report any decreased urine output or anuria.  -     CMP14+EGFR  Bilateral impacted cerumen Bilateral ears irrigated in office. Biltaeral TM WNL post irrigation. No dizziness or tinnitus. Tolerated well. Discussed prevention of earwax buildup.     Continue all other maintenance medications.  Follow up plan: Return in about 6 months (around 05/22/2019), or if symptoms worsen or fail to improve.  Continue healthy lifestyle choices, including diet (rich in fruits, vegetables, and lean proteins, and low in salt and simple carbohydrates) and exercise (at least 30 minutes of moderate physical activity daily).  Educational handout given for earwax buildup   The above assessment and management plan was discussed with the patient. The patient verbalized understanding of and has agreed to the management plan. Patient is aware to call the clinic if they develop any new symptoms or if symptoms persist or worsen. Patient is aware when to return to the clinic for a follow-up visit. Patient educated on when it is appropriate to go to the emergency department.   Monia Pouch, FNP-C [facility] [phone]

## 2018-11-23 ENCOUNTER — Other Ambulatory Visit: Payer: Self-pay | Admitting: Family Medicine

## 2018-11-23 ENCOUNTER — Telehealth: Payer: Self-pay | Admitting: Family Medicine

## 2018-11-23 LAB — CMP14+EGFR
ALT: 13 IU/L (ref 0–44)
AST: 20 IU/L (ref 0–40)
Albumin/Globulin Ratio: 2.3 — ABNORMAL HIGH (ref 1.2–2.2)
Albumin: 4.3 g/dL (ref 3.6–4.6)
Alkaline Phosphatase: 54 IU/L (ref 39–117)
BUN/Creatinine Ratio: 14 (ref 10–24)
BUN: 18 mg/dL (ref 8–27)
Bilirubin Total: 0.4 mg/dL (ref 0.0–1.2)
CO2: 23 mmol/L (ref 20–29)
Calcium: 9.2 mg/dL (ref 8.6–10.2)
Chloride: 108 mmol/L — ABNORMAL HIGH (ref 96–106)
Creatinine, Ser: 1.32 mg/dL — ABNORMAL HIGH (ref 0.76–1.27)
GFR calc Af Amer: 55 mL/min/{1.73_m2} — ABNORMAL LOW (ref >59)
GFR calc non Af Amer: 48 mL/min/{1.73_m2} — ABNORMAL LOW (ref >59)
Globulin, Total: 1.9 g/dL (ref 1.5–4.5)
Glucose: 119 mg/dL — ABNORMAL HIGH (ref 65–99)
Potassium: 4.3 mmol/L (ref 3.5–5.2)
Sodium: 143 mmol/L (ref 134–144)
Total Protein: 6.2 g/dL (ref 6.0–8.5)

## 2018-11-23 LAB — THYROID PANEL WITH TSH
Free Thyroxine Index: 2.3 (ref 1.2–4.9)
T3 Uptake Ratio: 32 % (ref 24–39)
T4, Total: 7.1 ug/dL (ref 4.5–12.0)
TSH: 0.674 u[IU]/mL (ref 0.450–4.500)

## 2018-11-23 LAB — CBC WITH DIFFERENTIAL/PLATELET
Basophils Absolute: 0.1 10*3/uL (ref 0.0–0.2)
Basos: 1 %
EOS (ABSOLUTE): 0.1 10*3/uL (ref 0.0–0.4)
Eos: 2 %
Hematocrit: 33.8 % — ABNORMAL LOW (ref 37.5–51.0)
Hemoglobin: 11 g/dL — ABNORMAL LOW (ref 13.0–17.7)
Immature Grans (Abs): 0 10*3/uL (ref 0.0–0.1)
Immature Granulocytes: 0 %
Lymphocytes Absolute: 1.2 10*3/uL (ref 0.7–3.1)
Lymphs: 16 %
MCH: 30.8 pg (ref 26.6–33.0)
MCHC: 32.5 g/dL (ref 31.5–35.7)
MCV: 95 fL (ref 79–97)
Monocytes Absolute: 0.5 10*3/uL (ref 0.1–0.9)
Monocytes: 7 %
Neutrophils Absolute: 5.7 10*3/uL (ref 1.4–7.0)
Neutrophils: 74 %
Platelets: 308 10*3/uL (ref 150–450)
RBC: 3.57 x10E6/uL — ABNORMAL LOW (ref 4.14–5.80)
RDW: 12.3 % (ref 11.6–15.4)
WBC: 7.6 10*3/uL (ref 3.4–10.8)

## 2018-11-23 LAB — LIPID PANEL
Chol/HDL Ratio: 2.4 ratio (ref 0.0–5.0)
Cholesterol, Total: 133 mg/dL (ref 100–199)
HDL: 55 mg/dL (ref >39)
LDL Chol Calc (NIH): 59 mg/dL (ref 0–99)
Triglycerides: 105 mg/dL (ref 0–149)
VLDL Cholesterol Cal: 19 mg/dL (ref 5–40)

## 2018-11-23 LAB — VITAMIN D 25 HYDROXY (VIT D DEFICIENCY, FRACTURES): Vit D, 25-Hydroxy: 44.1 ng/mL (ref 30.0–100.0)

## 2018-11-23 NOTE — Telephone Encounter (Signed)
Refer to result note 

## 2018-12-02 ENCOUNTER — Ambulatory Visit (INDEPENDENT_AMBULATORY_CARE_PROVIDER_SITE_OTHER): Payer: Medicare HMO | Admitting: *Deleted

## 2018-12-02 ENCOUNTER — Other Ambulatory Visit: Payer: Self-pay

## 2018-12-02 DIAGNOSIS — Z0000 Encounter for general adult medical examination without abnormal findings: Secondary | ICD-10-CM | POA: Diagnosis not present

## 2018-12-02 NOTE — Patient Instructions (Signed)
Preventive Care 83 Years and Older, Male Preventive care refers to lifestyle choices and visits with your health care provider that can promote health and wellness. This includes:  A yearly physical exam. This is also called an annual well check.  Regular dental and eye exams.  Immunizations.  Screening for certain conditions.  Healthy lifestyle choices, such as diet and exercise. What can I expect for my preventive care visit? Physical exam Your health care provider will check:  Height and weight. These may be used to calculate body mass index (BMI), which is a measurement that tells if you are at a healthy weight.  Heart rate and blood pressure.  Your skin for abnormal spots. Counseling Your health care provider may ask you questions about:  Alcohol, tobacco, and drug use.  Emotional well-being.  Home and relationship well-being.  Sexual activity.  Eating habits.  History of falls.  Memory and ability to understand (cognition).  Work and work Statistician. What immunizations do I need?  Influenza (flu) vaccine  This is recommended every year. Tetanus, diphtheria, and pertussis (Tdap) vaccine  You may need a Td booster every 10 years. Varicella (chickenpox) vaccine  You may need this vaccine if you have not already been vaccinated. Zoster (shingles) vaccine  You may need this after age 50. Pneumococcal conjugate (PCV13) vaccine  One dose is recommended after age 24. Pneumococcal polysaccharide (PPSV23) vaccine  One dose is recommended after age 33. Measles, mumps, and rubella (MMR) vaccine  You may need at least one dose of MMR if you were born in 1957 or later. You may also need a second dose. Meningococcal conjugate (MenACWY) vaccine  You may need this if you have certain conditions. Hepatitis A vaccine  You may need this if you have certain conditions or if you travel or work in places where you may be exposed to hepatitis A. Hepatitis B vaccine   You may need this if you have certain conditions or if you travel or work in places where you may be exposed to hepatitis B. Haemophilus influenzae type b (Hib) vaccine  You may need this if you have certain conditions. You may receive vaccines as individual doses or as more than one vaccine together in one shot (combination vaccines). Talk with your health care provider about the risks and benefits of combination vaccines. What tests do I need? Blood tests  Lipid and cholesterol levels. These may be checked every 5 years, or more frequently depending on your overall health.  Hepatitis C test.  Hepatitis B test. Screening  Lung cancer screening. You may have this screening every year starting at age 74 if you have a 30-pack-year history of smoking and currently smoke or have quit within the past 15 years.  Colorectal cancer screening. All adults should have this screening starting at age 57 and continuing until age 54. Your health care provider may recommend screening at age 47 if you are at increased risk. You will have tests every 1-10 years, depending on your results and the type of screening test.  Prostate cancer screening. Recommendations will vary depending on your family history and other risks.  Diabetes screening. This is done by checking your blood sugar (glucose) after you have not eaten for a while (fasting). You may have this done every 1-3 years.  Abdominal aortic aneurysm (AAA) screening. You may need this if you are a current or former smoker.  Sexually transmitted disease (STD) testing. Follow these instructions at home: Eating and drinking  Eat  a diet that includes fresh fruits and vegetables, whole grains, lean protein, and low-fat dairy products. Limit your intake of foods with high amounts of sugar, saturated fats, and salt.  Take vitamin and mineral supplements as recommended by your health care provider.  Do not drink alcohol if your health care provider  tells you not to drink.  If you drink alcohol: ? Limit how much you have to 0-2 drinks a day. ? Be aware of how much alcohol is in your drink. In the [place] one drink equals one 12 oz bottle of beer (355 mL), one 5 oz glass of wine (148 mL), or one 1 oz glass of hard liquor (44 mL). Lifestyle  Take daily care of your teeth and gums.  Stay active. Exercise for at least 30 minutes on 5 or more days each week.  Do not use any products that contain nicotine or tobacco, such as cigarettes, e-cigarettes, and chewing tobacco. If you need help quitting, ask your health care provider.  If you are sexually active, practice safe sex. Use a condom or other form of protection to prevent STIs (sexually transmitted infections).  Talk with your health care provider about taking a low-dose aspirin or statin. What's next?  Visit your health care provider once a year for a well check visit.  Ask your health care provider how often you should have your eyes and teeth checked.  Stay up to date on all vaccines. This information is not intended to replace advice given to you by your health care provider. Make sure you discuss any questions you have with your health care provider. Document Released: 01/26/2015 Document Revised: 12/24/2017 Document Reviewed: 12/24/2017 Elsevier Patient Education  2020 Elsevier Inc.  

## 2018-12-02 NOTE — Progress Notes (Signed)
MEDICARE ANNUAL WELLNESS VISIT  12/02/2018  Telephone Visit Disclaimer This Medicare AWV was conducted by telephone due to national recommendations for restrictions regarding the COVID-19 Pandemic (e.g. social distancing).  I verified, using two identifiers, that I am speaking with Benjamin Buchanan or their authorized healthcare agent. I discussed the limitations, risks, security, and privacy concerns of performing an evaluation and management service by telephone and the potential availability of an in-person appointment in the future. The patient expressed understanding and agreed to proceed.   Subjective:  Benjamin Buchanan is a 85 y.o. male patient of Rakes, Connye Burkitt, FNP who had a Medicare Annual Wellness Visit today via telephone. Benjamin Buchanan is Retired and lives alone after his wife passed away a little over 2 years ago. 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 he has 1 living child and 1 that passed. he reports that he is socially active and does interact with friends/family regularly. he is moderately physically active and enjoys sitting on his front porch swing watching the cars go by.  Patient Care Team: Baruch Gouty, FNP as PCP - General (Family Medicine) Larey Dresser, MD as Consulting Physician (Cardiology)  Advanced Directives 12/02/2018 02/10/2018 02/08/2018 10/23/2016 10/16/2016 09/05/2016  Does Patient Have a Medical Advance Directive? No Yes Yes Yes Yes Yes  Type of Advance Directive - [place];Living will [place];Living will Living will - Living will  Does patient want to make changes to medical advance directive? - No - Patient declined No - Patient declined No - Patient declined - -  Copy of [place] in Chart? - No - copy requested No - copy requested No - copy requested No - copy requested -  Would patient like information on  creating a medical advance directive? No - Patient declined - - - - [facility] Utilization Over the Past 12 Months: # of hospitalizations or ER visits: 0 # of surgeries: 1  Review of Systems    Patient reports that his overall health is unchanged compared to last year.  History obtained from chart review  Patient Reported Readings (BP, Pulse, CBG, Weight, etc) none  Pain Assessment Pain : No/denies pain     Current Medications & Allergies (verified) Allergies as of 12/02/2018      Reactions   Lipitor [atorvastatin] Other (See Comments)   myalgia      Medication List       Accurate as of December 02, 2018  9:02 AM. If you have any questions, ask your nurse or doctor.        acetaminophen 325 MG tablet Commonly known as: TYLENOL Take 325 mg by mouth 2 (two) times daily as needed for moderate pain.   amLODipine 5 MG tablet Commonly known as: NORVASC TAKE 1 TABLET BY MOUTH EVERY DAY   aspirin 81 MG tablet Take 81 mg by mouth every Monday, Wednesday, and Friday.   benazepril 20 MG tablet Commonly known as: LOTENSIN TAKE 1 TABLET BY MOUTH EVERY DAY   Cholecalciferol 25 MCG (1000 UT) capsule Take 1,000 Units by mouth daily.   fenofibrate 54 MG tablet TAKE 1 TABLET BY MOUTH EVERY DAY   fish oil-omega-3 fatty acids 1000 MG capsule Take 1 g by mouth daily.   glucose blood test strip Check BS QD and PRN   hydrochlorothiazide 25 MG tablet  Commonly known as: HYDRODIURIL TAKE 1 TABLET BY MOUTH EVERY DAY   Integra Plus Caps TAKE 1 CAPSULE BY MOUTH EVERY DAY   rosuvastatin 5 MG tablet Commonly known as: CRESTOR TAKE 1 TABLET BY MOUTH EVERY DAY IN THE EVENING   vitamin C 500 MG tablet Commonly known as: ASCORBIC ACID Take 500 mg by mouth 2 (two) times daily.       History (reviewed): Past Medical History:  Diagnosis Date  . Anemia   . History of prostate cancer 1994  . HLD (hyperlipidemia)   . HTN (hypertension)   . PVC (premature ventricular  contraction)    history of   Past Surgical History:  Procedure Laterality Date  . HERNIA REPAIR    . INGUINAL HERNIA REPAIR Left 10/23/2016   Procedure: LEFT INGUINAL HERNIA REPAIR WITH MESH  ;  Surgeon: Rolm Bookbinder, MD;  Location: [facility];  Service: General;  Laterality: Left;  . INSERTION OF MESH Left 10/23/2016   Procedure: INSERTION OF MESH;  Surgeon: Rolm Bookbinder, MD;  Location: [facility];  Service: General;  Laterality: Left;  . LUMBAR LAMINECTOMY/DECOMPRESSION MICRODISCECTOMY N/A 02/10/2018   Procedure: L2-3, L3-4 and L4-5 complete decompressive lumbar laminectomies for spinal stenosis, Foraminotomies  for L3, L4, L5 nerve roots.;  Surgeon: Latanya Maudlin, MD;  Location: [facility];  Service: Orthopedics;  Laterality: N/A;  139min  . PROSTATE SURGERY     Family History  Problem Relation Age of Onset  . Cancer Father        prostate cancer  . Hypertension Other        family history   Social History   Socioeconomic History  . Marital status: Widowed    Spouse name: Not on file  . Number of children: 2  . Years of education: 10  . Highest education level: 10th grade  Occupational History  . Occupation: retired  Scientific laboratory technician  . Financial resource strain: Not hard at all  . Food insecurity    Worry: Never true    Inability: Never true  . Transportation needs    Medical: No    Non-medical: No  Tobacco Use  . Smoking status: Never Smoker  . Smokeless tobacco: Never Used  Substance and Sexual Activity  . Alcohol use: No  . Drug use: No  . Sexual activity: Not Currently  Lifestyle  . Physical activity    Days per week: 7 days    Minutes per session: 30 min  . Stress: Not at all  Relationships  . Social connections    Talks on phone: More than three times a week    Gets together: More than three times a week    Attends religious service: More than 4 times per year    Active member of club or organization: No    Attends  meetings of clubs or organizations: Never    Relationship status: Widowed  Other Topics Concern  . Not on file  Social History Narrative   Retired     Activities of Daily Living In your present state of health, do you have any difficulty performing the following activities: 12/02/2018 02/10/2018  Hearing? N N  Vision? N N  Comment wears reading glasses-hasn't had an eye exam in almost 3 years per pt -  Difficulty concentrating or making decisions? N N  Walking or climbing stairs? N N  Comment he doesn't climb stairs a lot-he has 6 steps out his backdoor and he tries not to go  out the back door -  Dressing or bathing? N N  Doing errands, shopping? N N  Preparing Food and eating ? N -  Using the Toilet? N -  In the past six months, have you accidently leaked urine? N -  Do you have problems with loss of bowel control? N -  Managing your Medications? N -  Comment has a pill box that he does for the week -  Managing your Finances? N -  Housekeeping or managing your Housekeeping? N -  Some recent data might be hidden    Patient Education/ Literacy How often do you need to have someone help you when you read instructions, pamphlets, or other written materials from your doctor or pharmacy?: 1 - Never What is the last grade level you completed in school?: 10th  Exercise Current Exercise Habits: Home exercise routine, Type of exercise: walking, Time (Minutes): 30, Frequency (Times/Week): 7, Weekly Exercise (Minutes/Week): 210, Intensity: Mild, Exercise limited by: None identified  Diet Patient reports consuming 2 meals a day and 1 snack(s) a day Patient reports that his primary diet is: Regular Patient reports that she does have regular access to food.   Depression Screen PHQ 2/9 Scores 12/02/2018 11/22/2018 10/19/2017 09/07/2017 08/24/2017 07/21/2017 04/17/2017  PHQ - 2 Score 0 0 0 0 1 0 0     Fall Risk Fall Risk  12/02/2018 11/22/2018 10/19/2017 09/07/2017 08/24/2017  Falls in the past  year? 0 0 No No No     Objective:  Benjamin Buchanan seemed alert and oriented and he participated appropriately during our telephone visit.  Blood Pressure Weight BMI  BP Readings from Last 3 Encounters:  11/22/18 137/63  09/10/18 130/77  02/11/18 (!) 143/70   Wt Readings from Last 3 Encounters:  11/22/18 141 lb (64 kg)  09/10/18 141 lb (64 kg)  02/10/18 151 lb (68.5 kg)   BMI Readings from Last 1 Encounters:  11/22/18 19.67 kg/m    *Unable to obtain current vital signs, weight, and BMI due to telephone visit type  Hearing/Vision  . Benjamin Buchanan did not seem to have difficulty with hearing/understanding during the telephone conversation . Reports that he has not had a formal eye exam by an eye care professional within the past year . Reports that he has not had a formal hearing evaluation within the past year *Unable to fully assess hearing and vision during telephone visit type  Cognitive Function: 6CIT Screen 12/02/2018  What Year? 0 points  What month? 0 points  What time? 0 points  Count back from 20 4 points  Months in reverse 0 points  Repeat phrase 2 points  Total Score 6   (Normal:0-7, Significant for Dysfunction: >8)  Normal Cognitive Function Screening: Yes   Immunization & Health Maintenance Record Immunization History  Administered Date(s) Administered  . Fluad Quad(high Dose 65+) 10/29/2018  . Influenza, High Dose Seasonal PF 11/18/2016, 10/19/2017  . Influenza,inj,Quad PF,6+ Mos 12/14/2012, 11/01/2013, 12/06/2014  . Influenza-Unspecified 11/29/2008, 10/24/2009, 10/24/2010, 10/20/2011, 11/09/2015  . Pneumococcal Conjugate-13 07/19/2013  . Pneumococcal Polysaccharide-23 10/24/2009  . Td 10/24/2010  . Zoster 11/05/2011    Health Maintenance  Topic Date Due  . COLONOSCOPY  02/22/2019 (Originally 11/14/2015)  . TETANUS/TDAP  10/23/2020  . INFLUENZA VACCINE  Completed  . PNA vac Low Risk Adult  Completed       Assessment  This is a routine  wellness examination for Benjamin Buchanan.  Health Maintenance: Due or Overdue There are no preventive care reminders  to display for this patient.  Benjamin Buchanan does not need a referral for Community Assistance: Care Management:   no Social Work:    no Prescription Assistance:  no Nutrition/Diabetes Education:  no   Plan:  Personalized Goals Goals Addressed            This Visit's Progress   . DIET - INCREASE WATER INTAKE       Try to drink 6-8 glasses of water daily      Personalized Health Maintenance & Screening Recommendations  All health recommendations and screenings are up to date  Lung Cancer Screening Recommended: no (Low Dose CT Chest recommended if Age 68-80 years, 30 pack-year currently smoking OR have quit w/in past 15 years) Hepatitis C Screening recommended: no HIV Screening recommended: no  Advanced Directives: Written information was not prepared per patient's request.  Referrals & Orders No orders of the defined types were placed in this encounter.   Follow-up Plan . Follow-up with Baruch Gouty, FNP as planned   I have personally reviewed and noted the following in the patient's chart:   . Medical and social history . Use of alcohol, tobacco or illicit drugs  . Current medications and supplements . Functional ability and status . Nutritional status . Physical activity . Advanced directives . List of other physicians . Hospitalizations, surgeries, and ER visits in previous 12 months . Vitals . Screenings to include cognitive, depression, and falls . Referrals and appointments  In addition, I have reviewed and discussed with Benjamin Buchanan certain preventive protocols, quality metrics, and best practice recommendations. A written personalized care plan for preventive services as well as general preventive health recommendations is available and can be mailed to the patient at his request.      Milas Hock, LPN   [phone]

## 2018-12-23 ENCOUNTER — Telehealth: Payer: Self-pay | Admitting: Family Medicine

## 2018-12-23 NOTE — Telephone Encounter (Signed)
Nurse from [facility] called stating that she wanted to make Dr Thayer Ohm aware that patient has not refilled his Rosuvastatin for Oct, Nov, or Dec and also the Benazepril has not been refilled for Oct. Does not need a call back.

## 2019-01-04 DIAGNOSIS — R69 Illness, unspecified: Secondary | ICD-10-CM | POA: Diagnosis not present

## 2019-02-22 DIAGNOSIS — Z23 Encounter for immunization: Secondary | ICD-10-CM | POA: Diagnosis not present

## 2019-03-22 DIAGNOSIS — Z23 Encounter for immunization: Secondary | ICD-10-CM | POA: Diagnosis not present

## 2019-04-01 DIAGNOSIS — R69 Illness, unspecified: Secondary | ICD-10-CM | POA: Diagnosis not present

## 2019-04-07 ENCOUNTER — Encounter: Payer: Self-pay | Admitting: *Deleted

## 2019-04-11 ENCOUNTER — Other Ambulatory Visit: Payer: Self-pay | Admitting: Family Medicine

## 2019-04-11 ENCOUNTER — Telehealth: Payer: Self-pay | Admitting: Family Medicine

## 2019-04-11 DIAGNOSIS — I700 Atherosclerosis of aorta: Secondary | ICD-10-CM

## 2019-04-11 DIAGNOSIS — E7800 Pure hypercholesterolemia, unspecified: Secondary | ICD-10-CM

## 2019-04-11 MED ORDER — ROSUVASTATIN CALCIUM 5 MG PO TABS: ORAL_TABLET | ORAL | 0 refills | Status: DC

## 2019-04-11 NOTE — Telephone Encounter (Signed)
Patient aware and verbalizes understanding.  States he has been taking his crestor and aware he needs to be taking it.

## 2019-04-11 NOTE — Telephone Encounter (Signed)
Will send in refill, please let pt know he should be taking this.

## 2019-04-16 ENCOUNTER — Other Ambulatory Visit: Payer: Self-pay | Admitting: Family Medicine

## 2019-05-13 ENCOUNTER — Other Ambulatory Visit: Payer: Self-pay | Admitting: *Deleted

## 2019-05-13 MED ORDER — BENAZEPRIL HCL 20 MG PO TABS: 20.0000 mg | ORAL_TABLET | Freq: Every day | ORAL | 0 refills | Status: DC

## 2019-05-18 ENCOUNTER — Other Ambulatory Visit: Payer: Self-pay | Admitting: *Deleted

## 2019-05-18 MED ORDER — FENOFIBRATE 54 MG PO TABS: 54.0000 mg | ORAL_TABLET | Freq: Every day | ORAL | 0 refills | Status: DC

## 2019-05-18 MED ORDER — HYDROCHLOROTHIAZIDE 25 MG PO TABS: 25.0000 mg | ORAL_TABLET | Freq: Every day | ORAL | 0 refills | Status: DC

## 2019-05-23 ENCOUNTER — Ambulatory Visit (INDEPENDENT_AMBULATORY_CARE_PROVIDER_SITE_OTHER): Payer: Medicare HMO | Admitting: Family

## 2019-05-23 ENCOUNTER — Ambulatory Visit: Payer: Medicare HMO | Admitting: Family Medicine

## 2019-05-23 ENCOUNTER — Other Ambulatory Visit: Payer: Self-pay

## 2019-05-23 ENCOUNTER — Encounter: Payer: Self-pay | Admitting: Family

## 2019-05-23 VITALS — BP 145/78 | HR 82 | Temp 98.0°F | Ht 71.0 in | Wt 143.0 lb

## 2019-05-23 DIAGNOSIS — I10 Essential (primary) hypertension: Secondary | ICD-10-CM

## 2019-05-23 DIAGNOSIS — I447 Left bundle-branch block, unspecified: Secondary | ICD-10-CM | POA: Diagnosis not present

## 2019-05-23 DIAGNOSIS — E559 Vitamin D deficiency, unspecified: Secondary | ICD-10-CM | POA: Diagnosis not present

## 2019-05-23 DIAGNOSIS — E7800 Pure hypercholesterolemia, unspecified: Secondary | ICD-10-CM | POA: Diagnosis not present

## 2019-05-23 DIAGNOSIS — N1831 Chronic kidney disease, stage 3a: Secondary | ICD-10-CM | POA: Diagnosis not present

## 2019-05-23 DIAGNOSIS — Z8546 Personal history of malignant neoplasm of prostate: Secondary | ICD-10-CM

## 2019-05-23 DIAGNOSIS — M5137 Other intervertebral disc degeneration, lumbosacral region: Secondary | ICD-10-CM

## 2019-05-23 DIAGNOSIS — I700 Atherosclerosis of aorta: Secondary | ICD-10-CM | POA: Diagnosis not present

## 2019-05-23 DIAGNOSIS — D508 Other iron deficiency anemias: Secondary | ICD-10-CM | POA: Diagnosis not present

## 2019-05-23 DIAGNOSIS — M51379 Other intervertebral disc degeneration, lumbosacral region without mention of lumbar back pain or lower extremity pain: Secondary | ICD-10-CM

## 2019-05-23 NOTE — Patient Instructions (Signed)

## 2019-05-23 NOTE — Progress Notes (Signed)
Subjective:    [name], male    DOB: 1928/12/30, 86 y.o.   MRN: [id number]  Chief Complaint  Patient presents with  . Medical Management of Chronic Issues   PT presents to the office today for chronic follow up.  Hypertension This is a chronic problem. The current episode started more than 1 year ago. The problem has been waxing and waning since onset. The problem is controlled. Associated symptoms include malaise/fatigue. Pertinent negatives include no peripheral edema or shortness of breath. Risk factors for coronary artery disease include dyslipidemia and male gender. The current treatment provides moderate improvement. Compliance problems include medication cost.   Hyperlipidemia This is a chronic problem. The current episode started more than 1 year ago. The problem is controlled. Recent lipid tests were reviewed and are normal. Pertinent negatives include no shortness of breath. Current antihyperlipidemic treatment includes statins and fibric acid derivatives. The current treatment provides mild improvement of lipids. Risk factors for coronary artery disease include dyslipidemia, male sex, hypertension and a sedentary lifestyle.  Anemia Presents for follow-up visit. Symptoms include malaise/fatigue.  Arthritis Presents for follow-up visit. He complains of pain. Affected locations include the right hip and left hip (back). His pain is at a severity of 8/10.      Review of Systems  Constitutional: Positive for malaise/fatigue.  Respiratory: Negative for shortness of breath.   Musculoskeletal: Positive for arthritis.  All other systems reviewed and are negative.      Objective:   Physical Exam Vitals reviewed.  Constitutional:      General: He is not in acute distress.    Appearance: He is well-developed.  HENT:     Head: Normocephalic.     Right Ear: Tympanic membrane normal.     Left Ear: Tympanic membrane normal.  Eyes:     General:      Right eye: No discharge.        Left eye: No discharge.     Pupils: Pupils are equal, round, and reactive to light.  Neck:     Thyroid: No thyromegaly.  Cardiovascular:     Rate and Rhythm: Normal rate and regular rhythm.     Heart sounds: Normal heart sounds. No murmur.  Pulmonary:     Effort: Pulmonary effort is normal. No respiratory distress.     Breath sounds: Normal breath sounds. No wheezing.  Abdominal:     General: Bowel sounds are normal. There is no distension.     Palpations: Abdomen is soft.     Tenderness: There is no abdominal tenderness.  Musculoskeletal:        General: No tenderness. Normal range of motion.     Cervical back: Normal range of motion and neck supple.  Skin:    General: Skin is warm and dry.     Findings: No erythema or rash.  Neurological:     Mental Status: He is alert and oriented to person, place, and time.     Cranial Nerves: No cranial nerve deficit.     Deep Tendon Reflexes: Reflexes are normal and symmetric.  Psychiatric:        Behavior: Behavior normal.        Thought Content: Thought content normal.        Judgment: Judgment normal.       BP (!) 145/78   Pulse 82   Temp 98 F (36.7 C) (Temporal)   Ht '5\' 11"'  (1.803 m)   Wt 143 lb (64.9  kg)   SpO2 97%   BMI 19.94 kg/m      Assessment & Plan:  Benjamin Buchanan comes in today with chief complaint of Medical Management of Chronic Issues   Diagnosis and orders addressed:  1. Essential hypertension - CMP14+EGFR - CBC with Differential/Platelet  2. LBBB (left bundle branch block) - CMP14+EGFR - CBC with Differential/Platelet  3. Stage 3a chronic kidney disease - CMP14+EGFR - CBC with Differential/Platelet  4. Thoracic aortic atherosclerosis (HCC) - CMP14+EGFR - CBC with Differential/Platelet  5. Degeneration of lumbosacral intervertebral disc - CMP14+EGFR - CBC with Differential/Platelet  6. History of prostate cancer - CMP14+EGFR - CBC with  Differential/Platelet  7. Pure hypercholesterolemia -Will stop Fenofibrate  Low fat diet and continue Crestor and Omega-3 - CMP14+EGFR - CBC with Differential/Platelet  8. Vitamin D deficiency - CMP14+EGFR - CBC with Differential/Platelet  9. Other iron deficiency anemia - CMP14+EGFR - CBC with Differential/Platelet   Labs pending Health Maintenance reviewed Diet and exercise encouraged  Follow up plan: 6 months    Evelina Dun, FNP

## 2019-05-24 LAB — CBC WITH DIFFERENTIAL/PLATELET
Basophils Absolute: 0 10*3/uL (ref 0.0–0.2)
Basos: 1 %
EOS (ABSOLUTE): 0.1 10*3/uL (ref 0.0–0.4)
Eos: 1 %
Hematocrit: 33.8 % — ABNORMAL LOW (ref 37.5–51.0)
Hemoglobin: 11.2 g/dL — ABNORMAL LOW (ref 13.0–17.7)
Immature Grans (Abs): 0 10*3/uL (ref 0.0–0.1)
Immature Granulocytes: 0 %
Lymphocytes Absolute: 1.4 10*3/uL (ref 0.7–3.1)
Lymphs: 19 %
MCH: 31.5 pg (ref 26.6–33.0)
MCHC: 33.1 g/dL (ref 31.5–35.7)
MCV: 95 fL (ref 79–97)
Monocytes Absolute: 0.5 10*3/uL (ref 0.1–0.9)
Monocytes: 7 %
Neutrophils Absolute: 5.5 10*3/uL (ref 1.4–7.0)
Neutrophils: 72 %
Platelets: 312 10*3/uL (ref 150–450)
RBC: 3.56 x10E6/uL — ABNORMAL LOW (ref 4.14–5.80)
RDW: 11.8 % (ref 11.6–15.4)
WBC: 7.6 10*3/uL (ref 3.4–10.8)

## 2019-05-24 LAB — CMP14+EGFR
ALT: 14 IU/L (ref 0–44)
AST: 22 IU/L (ref 0–40)
Albumin/Globulin Ratio: 1.7 (ref 1.2–2.2)
Albumin: 4 g/dL (ref 3.6–4.6)
Alkaline Phosphatase: 52 IU/L (ref 39–117)
BUN/Creatinine Ratio: 17 (ref 10–24)
BUN: 22 mg/dL (ref 8–27)
Bilirubin Total: 0.3 mg/dL (ref 0.0–1.2)
CO2: 22 mmol/L (ref 20–29)
Calcium: 9.7 mg/dL (ref 8.6–10.2)
Chloride: 107 mmol/L — ABNORMAL HIGH (ref 96–106)
Creatinine, Ser: 1.28 mg/dL — ABNORMAL HIGH (ref 0.76–1.27)
GFR calc Af Amer: 57 mL/min/{1.73_m2} — ABNORMAL LOW (ref >59)
GFR calc non Af Amer: 49 mL/min/{1.73_m2} — ABNORMAL LOW (ref >59)
Globulin, Total: 2.3 g/dL (ref 1.5–4.5)
Glucose: 119 mg/dL — ABNORMAL HIGH (ref 65–99)
Potassium: 4.4 mmol/L (ref 3.5–5.2)
Sodium: 145 mmol/L — ABNORMAL HIGH (ref 134–144)
Total Protein: 6.3 g/dL (ref 6.0–8.5)

## 2019-06-10 DIAGNOSIS — I10 Essential (primary) hypertension: Secondary | ICD-10-CM | POA: Diagnosis not present

## 2019-06-10 DIAGNOSIS — Z008 Encounter for other general examination: Secondary | ICD-10-CM | POA: Diagnosis not present

## 2019-06-10 DIAGNOSIS — Z7982 Long term (current) use of aspirin: Secondary | ICD-10-CM | POA: Diagnosis not present

## 2019-06-10 DIAGNOSIS — E785 Hyperlipidemia, unspecified: Secondary | ICD-10-CM | POA: Diagnosis not present

## 2019-06-10 DIAGNOSIS — M1990 Unspecified osteoarthritis, unspecified site: Secondary | ICD-10-CM | POA: Diagnosis not present

## 2019-06-10 DIAGNOSIS — G8929 Other chronic pain: Secondary | ICD-10-CM | POA: Diagnosis not present

## 2019-06-10 DIAGNOSIS — N529 Male erectile dysfunction, unspecified: Secondary | ICD-10-CM | POA: Diagnosis not present

## 2019-06-10 DIAGNOSIS — Z8546 Personal history of malignant neoplasm of prostate: Secondary | ICD-10-CM | POA: Diagnosis not present

## 2019-06-29 ENCOUNTER — Other Ambulatory Visit: Payer: Self-pay | Admitting: Family Medicine

## 2019-06-30 DIAGNOSIS — R69 Illness, unspecified: Secondary | ICD-10-CM | POA: Diagnosis not present

## 2019-07-07 ENCOUNTER — Other Ambulatory Visit: Payer: Self-pay

## 2019-07-07 ENCOUNTER — Emergency Department (HOSPITAL_COMMUNITY): Payer: Medicare HMO

## 2019-07-07 ENCOUNTER — Emergency Department (HOSPITAL_COMMUNITY)
Admission: EM | Admit: 2019-07-07 | Discharge: 2019-07-07 | Disposition: A | Discharge status: Home / Self Care | Payer: Medicare HMO | Attending: Emergency Medicine | Admitting: Emergency Medicine

## 2019-07-07 ENCOUNTER — Encounter (HOSPITAL_COMMUNITY): Payer: Self-pay

## 2019-07-07 DIAGNOSIS — Z8546 Personal history of malignant neoplasm of prostate: Secondary | ICD-10-CM | POA: Insufficient documentation

## 2019-07-07 DIAGNOSIS — Y929 Unspecified place or not applicable: Secondary | ICD-10-CM | POA: Diagnosis not present

## 2019-07-07 DIAGNOSIS — S6992XA Unspecified injury of left wrist, hand and finger(s), initial encounter: Secondary | ICD-10-CM | POA: Diagnosis present

## 2019-07-07 DIAGNOSIS — N1831 Chronic kidney disease, stage 3a: Secondary | ICD-10-CM | POA: Diagnosis not present

## 2019-07-07 DIAGNOSIS — I129 Hypertensive chronic kidney disease with stage 1 through stage 4 chronic kidney disease, or unspecified chronic kidney disease: Secondary | ICD-10-CM | POA: Insufficient documentation

## 2019-07-07 DIAGNOSIS — Y999 Unspecified external cause status: Secondary | ICD-10-CM | POA: Insufficient documentation

## 2019-07-07 DIAGNOSIS — Y9389 Activity, other specified: Secondary | ICD-10-CM | POA: Insufficient documentation

## 2019-07-07 DIAGNOSIS — M79642 Pain in left hand: Secondary | ICD-10-CM | POA: Diagnosis not present

## 2019-07-07 DIAGNOSIS — W312XXA Contact with powered woodworking and forming machines, initial encounter: Secondary | ICD-10-CM | POA: Insufficient documentation

## 2019-07-07 DIAGNOSIS — Z7982 Long term (current) use of aspirin: Secondary | ICD-10-CM | POA: Diagnosis not present

## 2019-07-07 DIAGNOSIS — S60222A Contusion of left hand, initial encounter: Secondary | ICD-10-CM | POA: Diagnosis not present

## 2019-07-07 MED ORDER — TRAMADOL HCL 50 MG PO TABS
50.0000 mg | ORAL_TABLET | Freq: Once | ORAL | Status: AC
  Administered 2019-07-07: 50 mg via ORAL
  Filled 2019-07-07: qty 1

## 2019-07-07 MED ORDER — TRAMADOL HCL 50 MG PO TABS: 50.0000 mg | ORAL_TABLET | Freq: Four times a day (QID) | ORAL | 0 refills | Status: DC | PRN

## 2019-07-07 NOTE — Discharge Instructions (Addendum)
Wear the splint as needed for comfort.  Use ice on the sore area 3-4 times a day for 30 minutes for 2 days, after that use heat.  Follow-up with your doctor for checkup if you are not better in a few days.

## 2019-07-07 NOTE — ED Provider Notes (Signed)
[facility] Provider Note   CSN: [id number] Arrival date & time: 07/07/19  5093     History Chief Complaint  Patient presents with  . Hand Pain    Benjamin Buchanan is a 86 y.o. male.  HPI He presents for evaluation of pain with decreased ability to move the fingers of left hand, since an incident while starting a chainsaw, yesterday.  He states he was pulling on the starting cord when suddenly the cord handle was pulled from his hand, injuring his wrist and fingers.  Since then he has noticed decreased ability to flex the fingers of the left hand.  He also has mild pain and swelling in the volar left wrist region.  There are no other known modifying factors.    Past Medical History:  Diagnosis Date  . Anemia   . History of prostate cancer 1994  . HLD (hyperlipidemia)   . HTN (hypertension)   . PVC (premature ventricular contraction)    history of    Patient Active Problem List   Diagnosis Date Noted  . Absolute anemia 11/22/2018  . Stage 3a chronic kidney disease 11/22/2018  . Spinal stenosis of lumbar region at multiple levels 02/10/2018  . Degeneration of lumbosacral intervertebral disc 10/21/2017  . LBBB (left bundle branch block) 09/05/2016  . Vitamin D deficiency 02/21/2015  . Elevated serum creatinine 08/15/2014  . Thoracic aortic atherosclerosis ([place]) 02/07/2014  . History of prostate cancer 09/06/2012  . Essential hypertension 01/20/2012  . Hyperlipidemia 01/20/2012  . Cardiovascular risk factor 01/20/2012  . PVC's (premature ventricular contractions) 01/20/2012    Past Surgical History:  Procedure Laterality Date  . HERNIA REPAIR    . INGUINAL HERNIA REPAIR Left 10/23/2016   Procedure: LEFT INGUINAL HERNIA REPAIR WITH MESH  ;  Surgeon: Rolm Bookbinder, MD;  Location: [facility];  Service: General;  Laterality: Left;  . INSERTION OF MESH Left 10/23/2016   Procedure: INSERTION OF MESH;  Surgeon: Rolm Bookbinder,  MD;  Location: [facility];  Service: General;  Laterality: Left;  . LUMBAR LAMINECTOMY/DECOMPRESSION MICRODISCECTOMY N/A 02/10/2018   Procedure: L2-3, L3-4 and L4-5 complete decompressive lumbar laminectomies for spinal stenosis, Foraminotomies  for L3, L4, L5 nerve roots.;  Surgeon: Latanya Maudlin, MD;  Location: [facility];  Service: Orthopedics;  Laterality: N/A;  139min  . PROSTATE SURGERY         Family History  Problem Relation Age of Onset  . Cancer Father        prostate cancer  . Hypertension Other        family history    Social History   Tobacco Use  . Smoking status: Never Smoker  . Smokeless tobacco: Never Used  Vaping Use  . Vaping Use: Never used  Substance Use Topics  . Alcohol use: No  . Drug use: No    Home Medications Prior to Admission medications   Medication Sig Start Date End Date Taking? Authorizing Provider  acetaminophen (TYLENOL) 325 MG tablet Take 325 mg by mouth 2 (two) times daily as needed for moderate pain.    [provider]  amLODipine (NORVASC) 5 MG tablet TAKE 1 TABLET BY MOUTH EVERY DAY 04/18/19   Baruch Gouty, FNP  aspirin 81 MG tablet Take 81 mg by mouth every Monday, Wednesday, and Friday.     [provider]  benazepril (LOTENSIN) 20 MG tablet Take 1 tablet (20 mg total) by mouth daily. 05/13/19   Loman Brooklyn,  FNP  Cholecalciferol 1000 UNITS capsule Take 1,000 Units by mouth daily.    [provider]  FeFum-FePoly-FA-B Cmp-C-Biot (INTEGRA PLUS) CAPS TAKE 1 CAPSULE BY MOUTH EVERY DAY 11/24/18   Rakes, Connye Burkitt, FNP  fish oil-omega-3 fatty acids 1000 MG capsule Take 1 g by mouth daily.    [provider]  hydrochlorothiazide (HYDRODIURIL) 25 MG tablet Take 1 tablet (25 mg total) by mouth daily. 05/18/19   Janora Norlander, DO  ONETOUCH ULTRA test strip CHECK BLOOD SUGAR EVERY DAY AND AS NEEDED 06/30/19   Evelina Dun A, FNP  rosuvastatin (CRESTOR) 5 MG tablet TAKE 1 TABLET BY MOUTH EVERY  DAY IN THE EVENING 04/11/19   Rakes, Connye Burkitt, FNP  traMADol (ULTRAM) 50 MG tablet Take 1 tablet (50 mg total) by mouth every 6 (six) hours as needed for moderate pain. 07/07/19   Daleen Bo, MD  vitamin C (ASCORBIC ACID) 500 MG tablet Take 500 mg by mouth 2 (two) times daily.     [provider]    Allergies    Lipitor [atorvastatin]  Review of Systems   Review of Systems  All other systems reviewed and are negative.   Physical Exam Updated Vital Signs BP (!) 174/71 (BP Location: Right Arm)   Pulse 74   Temp 98.3 F (36.8 C) (Oral)   Resp 18   Ht 5\' 10"  (1.778 m)   Wt 64 kg   SpO2 97%   BMI 20.23 kg/m   Physical Exam Vitals and nursing note reviewed.  Constitutional:      General: He is not in acute distress.    Appearance: He is well-developed. He is not ill-appearing.  HENT:     Head: Normocephalic and atraumatic.     Right Ear: External ear normal.     Left Ear: External ear normal.  Eyes:     Conjunctiva/sclera: Conjunctivae normal.     Pupils: Pupils are equal, round, and reactive to light.  Neck:     Trachea: Phonation normal.  Cardiovascular:     Rate and Rhythm: Normal rate.  Pulmonary:     Effort: Pulmonary effort is normal.  Abdominal:     Palpations: Abdomen is soft.  Musculoskeletal:     Cervical back: Normal range of motion and neck supple.     Comments: Left hand/wrist-area of redness over the vulvar, distal ulna, with slight swelling.  This area is minimally tender to palpation.  He is able to flex the fingers of the left hand however arthritis primarily in the left first MCP joint (which is similar to right hand), prevents full flexion of the fingers of the left hand.  He has neurovascularly intact distally in the fingers of the left hand.  There are no areas of pain swelling or deformity of the left forearm or left elbow.  Skin:    General: Skin is warm and dry.  Neurological:     Mental Status: He is alert and oriented to person, place,  and time.     Cranial Nerves: No cranial nerve deficit.     Sensory: No sensory deficit.     Motor: No abnormal muscle tone.     Coordination: Coordination normal.  Psychiatric:        Mood and Affect: Mood normal.        Behavior: Behavior normal.        Thought Content: Thought content normal.        Judgment: Judgment normal.  ED Results / Procedures / Treatments   Labs (all labs ordered are listed, but only abnormal results are displayed) Labs Reviewed - No data to display  EKG None  Radiology DG Hand Complete Left  Result Date: 07/07/2019 CLINICAL DATA:  Left hand pain, swelling and erythema after injury yesterday EXAM: LEFT HAND - COMPLETE 3+ VIEW COMPARISON:  None. FINDINGS: No fracture or dislocation. No suspicious focal osseous lesions. Severe first carpometacarpal joint osteoarthritis. Mild second through fourth the IP joint osteoarthritis. Tiny 3 mm ovoid metallic density soft tissue focus in the volar left hand between the distal second and third metacarpals. IMPRESSION: 1. No fracture or dislocation. 2. Tiny ovoid metallic density soft tissue focus in the volar left hand between the distal second and third metacarpals, cannot exclude tiny foreign body. 3. Polyarticular osteoarthritis, severe at the first carpometacarpal joint. Electronically Signed   By: Ilona Sorrel M.D.   On: 07/07/2019 10:18    Procedures Procedures (including critical care time)  Medications Ordered in ED Medications  traMADol (ULTRAM) tablet 50 mg (50 mg Oral Given 07/07/19 1107)    ED Course  I have reviewed the triage vital signs and the nursing notes.  Pertinent labs & imaging results that were available during my care of the patient were reviewed by me and considered in my medical decision making (see chart for details).    MDM Rules/Calculators/A&P                           Patient Vitals for the past 24 hrs:  BP Temp Temp src Pulse Resp SpO2 Height Weight  07/07/19 1003 (!)  174/71 -- -- -- -- -- -- --  07/07/19 1000 -- -- -- -- -- -- 5\' 10"  (1.778 m) 64 kg  07/07/19 0959 -- 98.3 F (36.8 C) Oral 74 18 97 % -- --    10:22 AM Reevaluation with update and discussion. After initial assessment and treatment, an updated evaluation reveals no change in status, findings discussed with patient and the male friend, with him.  All questions answered. Daleen Bo   Medical Decision Making:  This patient is presenting for evaluation of injury of left hand and wrist, which does require a range of treatment options, and is a complaint that involves a moderate risk of morbidity and mortality. The differential diagnoses include fracture, contusion, sprain. I decided to review old records, and in summary healthy elderly male, with a injury yesterday.  I obtained additional historical information from a friend with the patient.   Radiologic Tests Ordered, included left hand x-ray.  I independently Visualized: Radiographic images, which show degenerative changes, first MCP joint.  Incidental foreign body, right finger 3.  No fracture or dislocation.    Critical Interventions-clinical evaluation, x-ray imaging, observation reassessment  After These Interventions, the Patient was reevaluated and was found stable for discharge.  Suspect left hand sprain, trying to start a chainsaw.  Doubt fracture, tendinitis or soft tissue infection.  Incidental foreign body not associated with a skin defect likely old.  No evidence of fracture.  Patient to be treated symptomatically with wrist splint  CRITICAL CARE-no Performed by: Daleen Bo  Nursing Notes Reviewed/ Care Coordinated Applicable Imaging Reviewed Interpretation of Laboratory Data incorporated into ED treatment  The patient appears reasonably screened and/or stabilized for discharge and I doubt any other medical condition or other [facility] requiring further screening, evaluation, or treatment in the ED at this time prior to  discharge.  Plan: Home Medications-continue usual medicine and use Tylenol for pain; Home Treatments-wrist splint, cryotherapy; return here if the recommended treatment, does not improve the symptoms; Recommended follow up-PCP, 1 week and as needed     Final Clinical Impression(s) / ED Diagnoses Final diagnoses:  Contusion of left hand, initial encounter    Rx / DC Orders ED Discharge Orders         Ordered    traMADol (ULTRAM) 50 MG tablet  Every 6 hours PRN     Discontinue  Reprint     07/07/19 1057           Daleen Bo, MD 07/07/19 1725

## 2019-07-07 NOTE — ED Triage Notes (Signed)
Pt reports pull starting a chain saw yesterday and it jerked out of his hand.  Reports pain to palm of left hand and difficulty making a fist.

## 2019-07-13 ENCOUNTER — Other Ambulatory Visit: Payer: Self-pay

## 2019-07-13 MED ORDER — AMLODIPINE BESYLATE 5 MG PO TABS: 5.0000 mg | ORAL_TABLET | Freq: Every day | ORAL | 0 refills | Status: DC

## 2019-08-06 ENCOUNTER — Other Ambulatory Visit: Payer: Self-pay | Admitting: Family Medicine

## 2019-08-11 ENCOUNTER — Other Ambulatory Visit: Payer: Self-pay

## 2019-08-11 ENCOUNTER — Other Ambulatory Visit: Payer: Self-pay | Admitting: Family Medicine

## 2019-08-11 MED ORDER — INTEGRA PLUS PO CAPS: ORAL_CAPSULE | ORAL | 1 refills | Status: DC

## 2019-08-23 IMAGING — XA DG MYELOGRAPHY LUMBAR INJ LUMBOSACRAL
13 of 19 series · 13 of 19 positions shown · non-contrast
Comparison: None

CLINICAL DATA: Low back pain. BILATERAL leg weakness, intermittent.
Suspected spinal stenosis with neurogenic claudication.
TECHNIQUE: Contiguous axial images were obtained through the Lumbar spine after
the intrathecal infusion of infusion. Coronal and sagittal
reconstructions were obtained of the axial image sets.

[Series 1: w lumbar spine ap · 0.15mm/px · 1 of 1 slices shown]
[im 1/1]
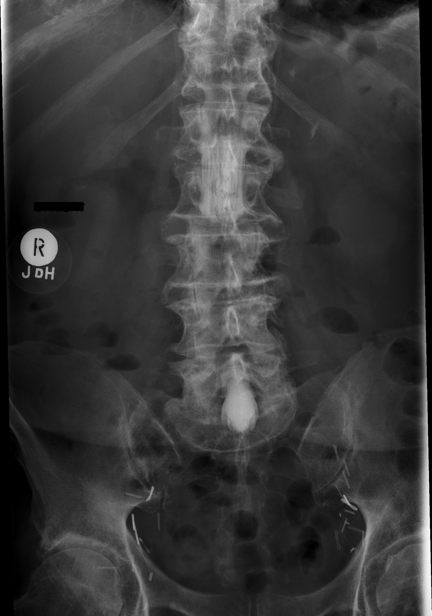

[Series 2: w lumbar spine lat · 0.15mm/px · 1 of 1 slices shown]
[im 1/1]
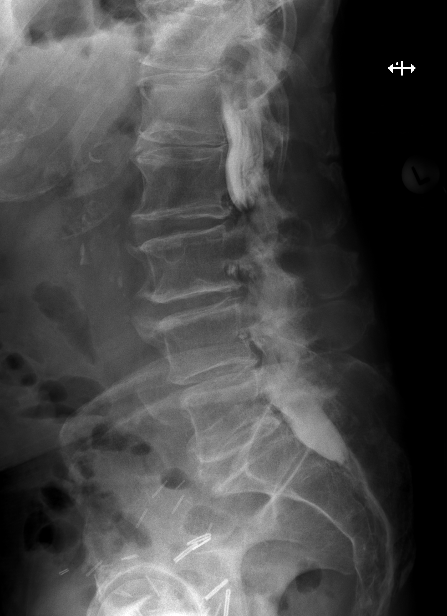

[Series 3: vasc standard · 1 of 1 slices shown (1 of 10)]
[im 1/1]
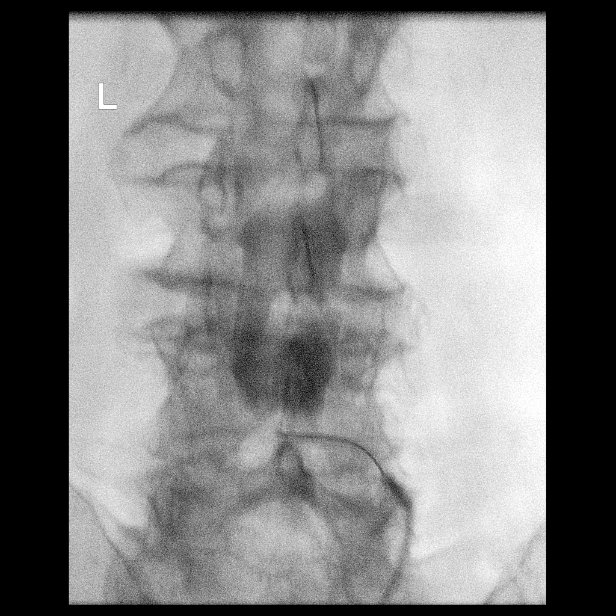

[Series 4: w lumbar spine extension · 0.15mm/px · 1 of 1 slices shown]
[im 1/1]
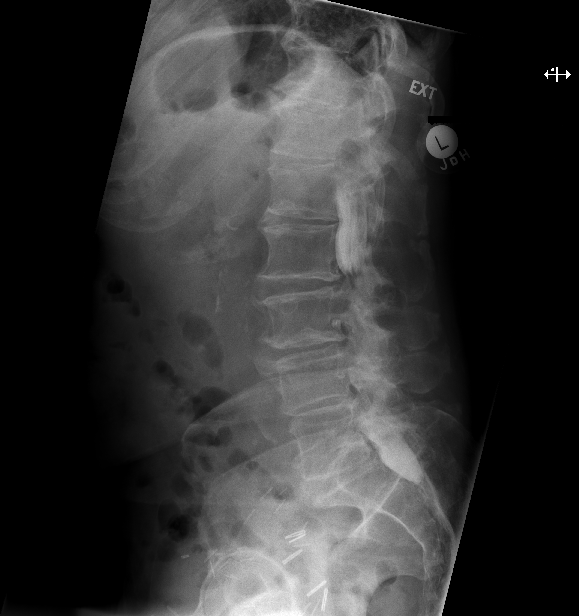

[Series 4: vasc standard · 1 of 1 slices shown (2 of 10)]
[im 1/1]
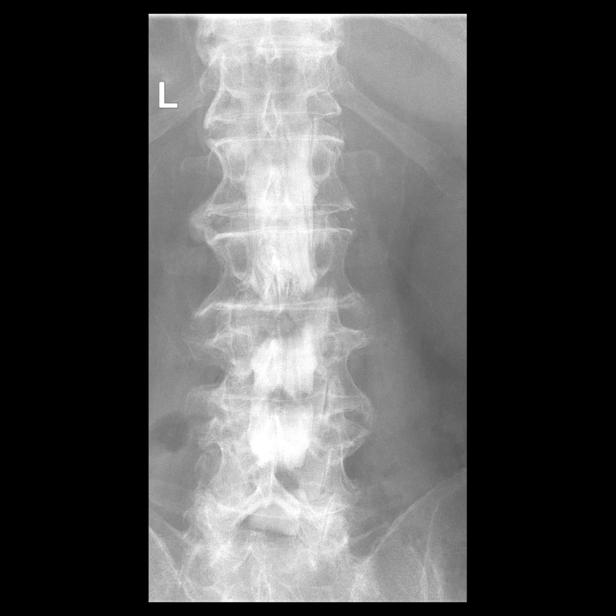

[Series 6: vasc standard · 1 of 1 slices shown (3 of 10)]
[im 1/1]
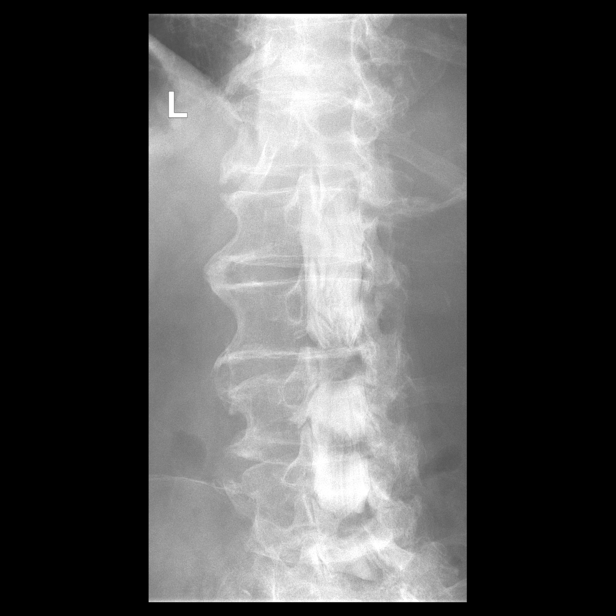

[Series 7: vasc standard · 1 of 1 slices shown (4 of 10)]
[im 1/1]
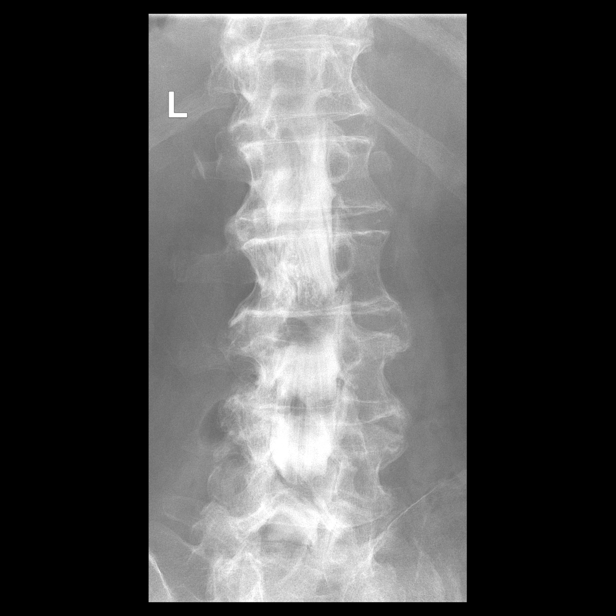

[Series 8: vasc standard · 1 of 1 slices shown (5 of 10)]
[im 1/1]
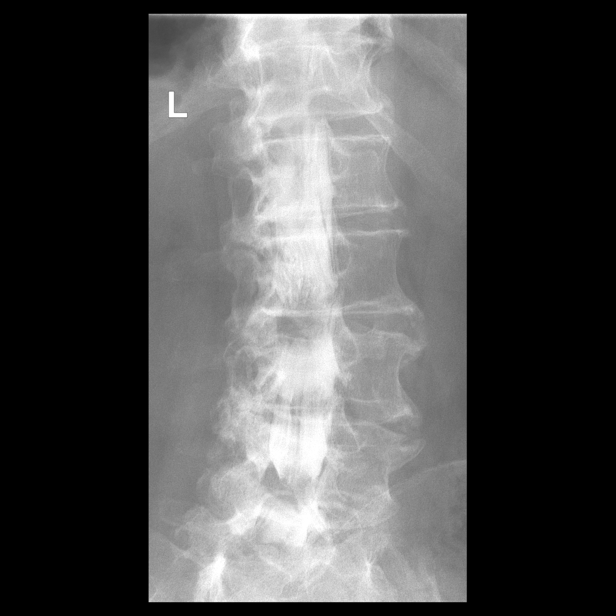

[Series 10: vasc standard · 1 of 1 slices shown (6 of 10)]
[im 1/1]
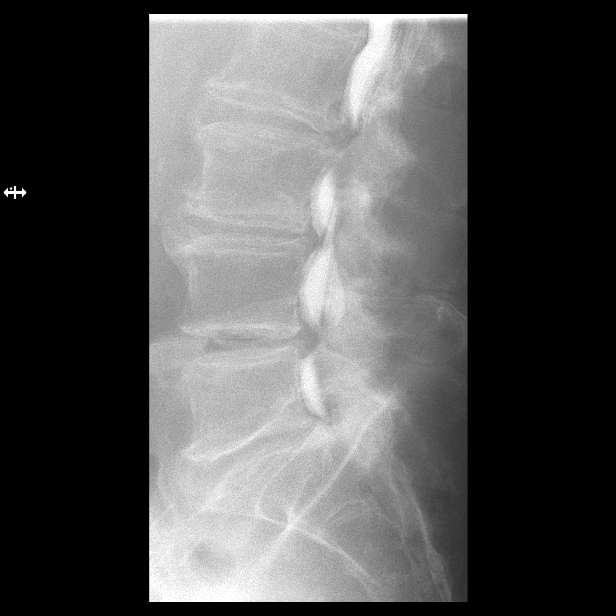

[Series 11: vasc standard · 1 of 1 slices shown (7 of 10)]
[im 1/1]
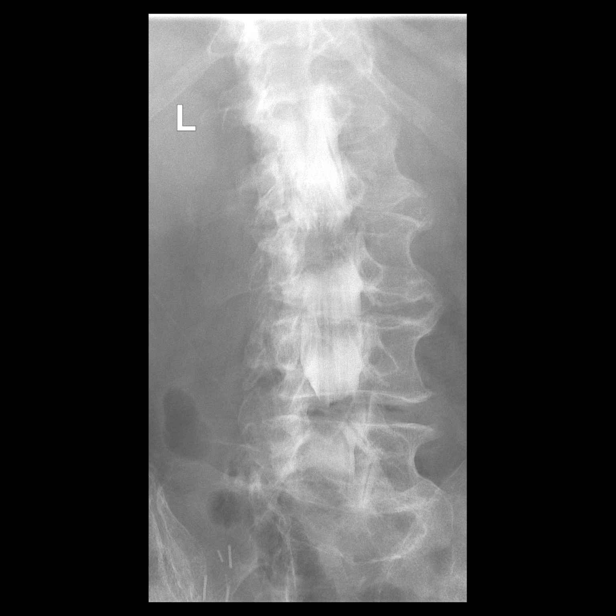

[Series 13: vasc standard · 1 of 1 slices shown (8 of 10)]
[im 1/1]
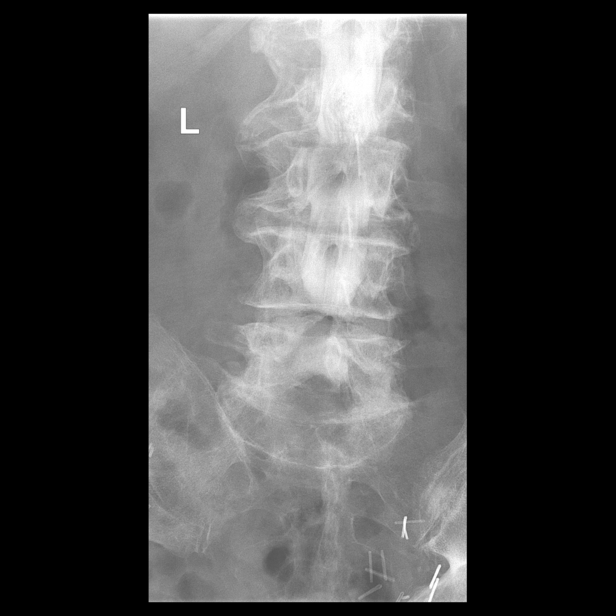

[Series 14: vasc standard · 1 of 1 slices shown (9 of 10)]
[im 1/1]
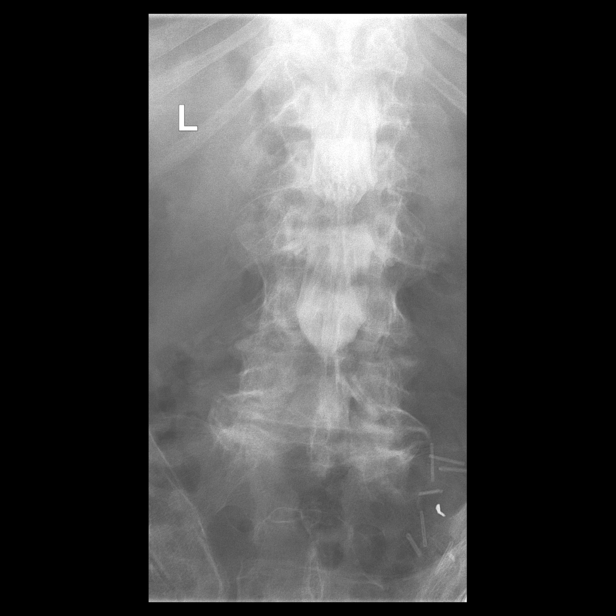

[Series 16: vasc standard · 1 of 1 slices shown (10 of 10)]
[im 1/1]
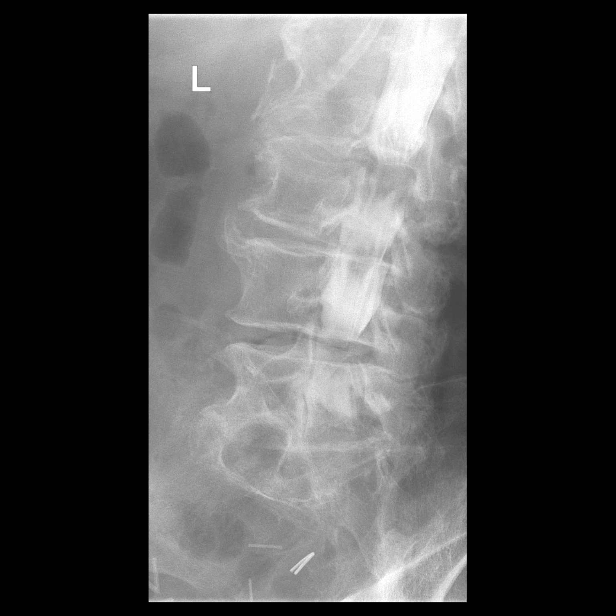

[13 of 19 positions shown; findings below may reference images not displayed]

EXAM:
LUMBAR MYELOGRAM

FLUOROSCOPY TIME:  38 seconds corresponding to a Dose Area Product
of 267.66 Gy*m2

PROCEDURE:
After thorough discussion of risks and benefits of the procedure
including bleeding, infection, injury to nerves, blood vessels,
adjacent structures as well as headache and CSF leak, written and
oral informed consent was obtained. Consent was obtained by Dr. Buitron
Stonebwoy. Time out form was completed.

Patient was positioned prone on the fluoroscopy table. Local
anesthesia was provided with 1% lidocaine without epinephrine after
prepped and draped in the usual sterile fashion. Puncture was
performed at L4-5 using a 3 1/2 inch 22-gauge spinal needle via
midline approach. Using a single pass through the dura, the needle
was placed within the thecal sac, with return of clear CSF. 15 mL of
Isovue [id number] was injected into the thecal sac, with normal
opacification of the nerve roots and cauda equina consistent with
free flow within the subarachnoid space.

I personally performed the lumbar puncture and administered the
intrathecal contrast. I also personally supervised acquisition of
the myelogram images.
FINDINGS: LUMBAR MYELOGRAM FINDINGS:

Good opacification of the lumbar subarachnoid space. There is severe
spinal stenosis at L2-3 and L4-5. BILATERAL L3 and BILATERAL L5
neural impingement is observed, respectively. Moderate to severe
spinal stenosis is observed at L3-4. Effacement of both L4 nerve
roots. No definite stenosis at L5-S1 although disc space narrowing
and osseous spurring is observed.

There is 1-2 mm anterolisthesis L5-S1, otherwise anatomic alignment.
This is unchanged with patient in standing flexion and extension.

CT LUMBAR MYELOGRAM FINDINGS:

Segmentation: Normal.

Alignment: 1-2 mm anterolisthesis L5-S1 is facet mediated. Trace
compensatory retrolisthesis L1-2.

Vertebrae: No worrisome osseous lesion.

Conus medullaris: Normal in size and location. Serpiginous nerve
roots opposite L2 reflect severe stenosis at the L2-3 level.

Paraspinal tissues: Aortic atherosclerosis.

Disc levels:

L1-L2: Disc space narrowing. Osseous spurring. Trace retrolisthesis.
Mild facet arthropathy. No impingement.

L2-L3: Preserved disc height. Large calcified central disc
extrusion, with osseous spurring and disc material extending into
both foramina. Posterior element hypertrophy is prominent. Critical
spinal stenosis, near complete block. Severe BILATERAL L3 neural
impingement. Neural foramina are sufficiently patent.

L3-L4: Preserved disc height. Broad-based central disc extrusion,
partially calcified, extending to both foramina with osseous
spurring. Large extraforaminal protrusion with bony overgrowth
extends into the LEFT psoas. Posterior element hypertrophy with
ligamentum flavum infolding. Severe stenosis. BILATERAL L3 and L4
neural impingement.

L4-L5: Preserved disc height. Vacuum phenomenon. Large central disc
extrusion with osseous spurring. Large extraforaminal protrusion is
prominent to the RIGHT. Posterior element hypertrophy. Critical
spinal stenosis, near complete block. BILATERAL L4 and L5 neural
impingement is present.

L5-S1: 2 mm anterolisthesis. Slight disc space narrowing. Facet
overgrowth and slight ligamentum flavum calcification. No stenosis
or subarticular zone narrowing. Significant BILATERAL foraminal
narrowing is observed, LEFT greater than RIGHT.
IMPRESSION: LUMBAR MYELOGRAM IMPRESSION:

Severe spinal stenosis at L2-3 and L4-5 multifactorial.

1-2 mm of degenerative anterolisthesis at L5-S1. No areas of
malalignment nor is there dynamic instability.

CT LUMBAR MYELOGRAM IMPRESSION:

Critical spinal stenosis at L2-3 and at L4-5, secondary to central
disc extrusions, and posterior element hypertrophy. Severe BILATERAL
subarticular zone narrowing at both levels. BILATERAL foraminal
narrowing at the L4-5 level is superimposed.

Severe stenosis at L3-4, also multifactorial. BILATERAL L3 and L4
neural impingement at this level.

2 mm anterolisthesis L5-S1 is unchanged from myelography both prone
and upright. No stenosis or subarticular zone narrowing, but
BILATERAL foraminal narrowing could affect either L5 nerve roots,
and is worse on the LEFT.

Aortic Atherosclerosis ([id number]-ZRZ.Z).

## 2019-09-07 ENCOUNTER — Other Ambulatory Visit: Payer: Self-pay | Admitting: Family Medicine

## 2019-09-09 ENCOUNTER — Other Ambulatory Visit: Payer: Self-pay | Admitting: Family Medicine

## 2019-09-27 DIAGNOSIS — R69 Illness, unspecified: Secondary | ICD-10-CM | POA: Diagnosis not present

## 2019-10-02 IMAGING — DX DG SPINE 1V PORT
1 series · 1 of 1 positions shown · non-contrast
Comparison: Postmyelogram CT scan of the lumbar spine 01/01/2018.

CLINICAL DATA: Intraoperative localization film for patient
undergoing lumbar surgery.

EXAM:
PORTABLE SPINE - 1 VIEW

[l-spine lat]
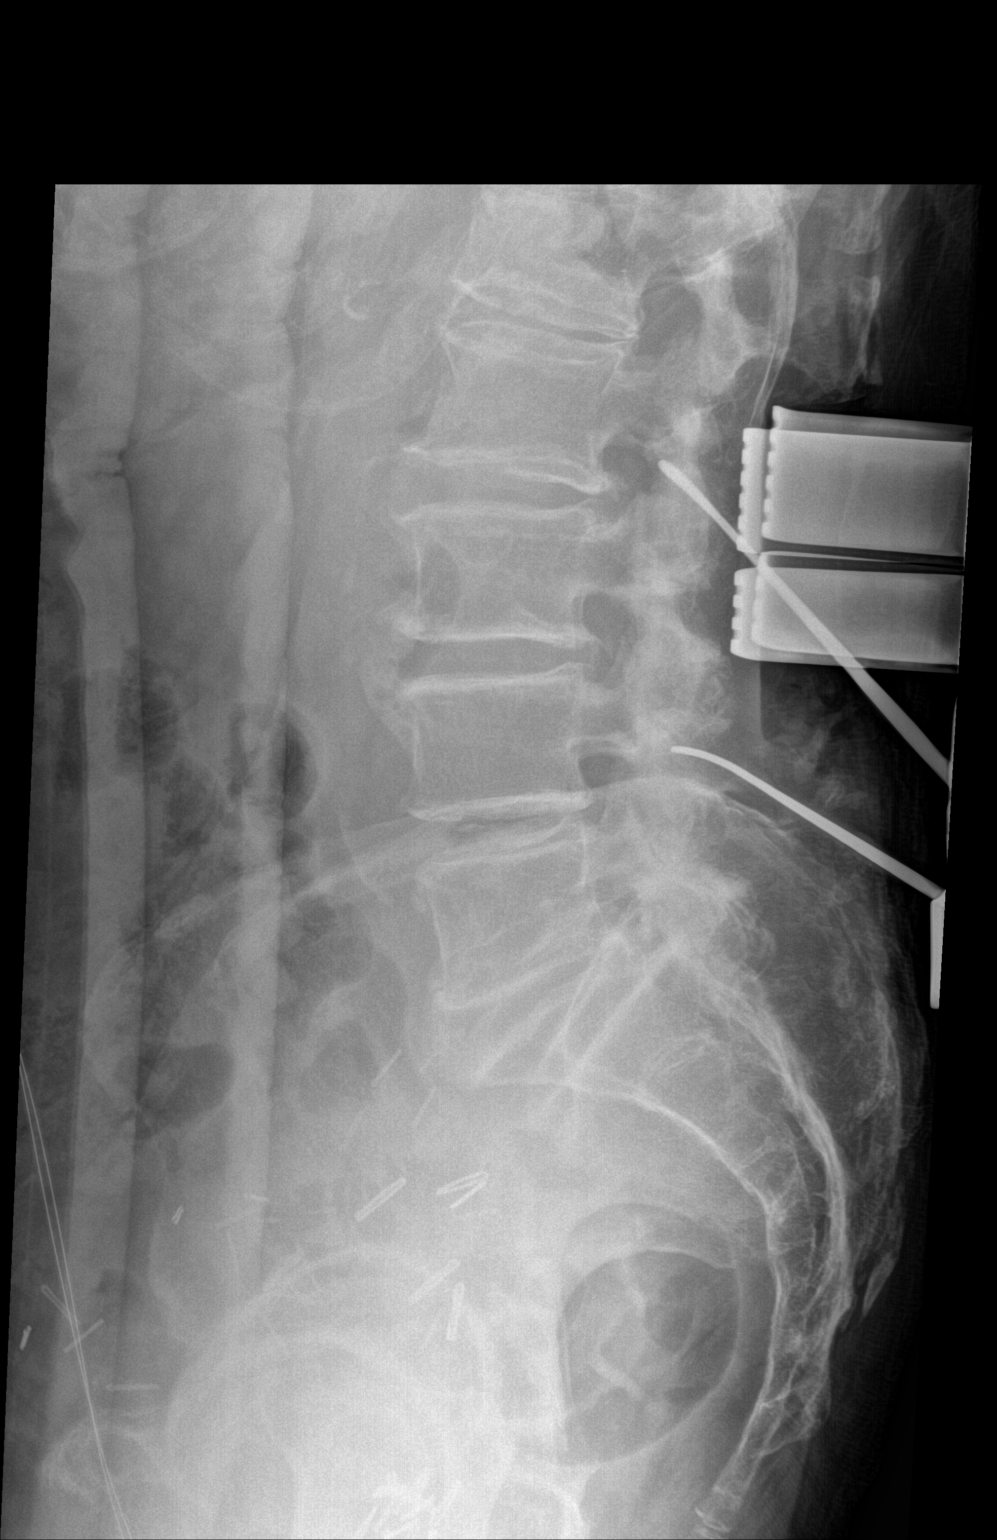

[1 of 1 positions shown; findings below may reference images not displayed]

FINDINGS: Clamps are in place on the L2 and L3 spinous processes. Probes are
at the level of the L2-3 foramina and inferior margin of the L4
pedicles.
IMPRESSION: Localization as above.

## 2019-10-10 ENCOUNTER — Other Ambulatory Visit: Payer: Self-pay | Admitting: Family

## 2019-10-10 DIAGNOSIS — I700 Atherosclerosis of aorta: Secondary | ICD-10-CM

## 2019-10-10 DIAGNOSIS — E7800 Pure hypercholesterolemia, unspecified: Secondary | ICD-10-CM

## 2019-10-10 MED ORDER — ROSUVASTATIN CALCIUM 5 MG PO TABS: ORAL_TABLET | ORAL | 0 refills | Status: DC

## 2019-10-10 NOTE — Addendum Note (Signed)
Addended by: Antonietta Barcelona D on: 10/10/2019 02:52 PM   Modules accepted: Orders

## 2019-11-04 ENCOUNTER — Other Ambulatory Visit: Payer: Self-pay | Admitting: Family

## 2019-11-21 ENCOUNTER — Ambulatory Visit (INDEPENDENT_AMBULATORY_CARE_PROVIDER_SITE_OTHER): Payer: Medicare HMO | Admitting: Family

## 2019-11-21 ENCOUNTER — Encounter: Payer: Self-pay | Admitting: Family

## 2019-11-21 ENCOUNTER — Other Ambulatory Visit: Payer: Self-pay

## 2019-11-21 VITALS — BP 130/75 | HR 67 | Temp 97.7°F | Ht 70.0 in | Wt 137.0 lb

## 2019-11-21 DIAGNOSIS — I10 Essential (primary) hypertension: Secondary | ICD-10-CM | POA: Diagnosis not present

## 2019-11-21 DIAGNOSIS — N1831 Chronic kidney disease, stage 3a: Secondary | ICD-10-CM

## 2019-11-21 DIAGNOSIS — E559 Vitamin D deficiency, unspecified: Secondary | ICD-10-CM

## 2019-11-21 DIAGNOSIS — I700 Atherosclerosis of aorta: Secondary | ICD-10-CM

## 2019-11-21 DIAGNOSIS — E7800 Pure hypercholesterolemia, unspecified: Secondary | ICD-10-CM

## 2019-11-21 DIAGNOSIS — I447 Left bundle-branch block, unspecified: Secondary | ICD-10-CM

## 2019-11-21 MED ORDER — AMLODIPINE BESYLATE 5 MG PO TABS: 5.0000 mg | ORAL_TABLET | Freq: Every day | ORAL | 1 refills | Status: DC

## 2019-11-21 MED ORDER — HYDROCHLOROTHIAZIDE 25 MG PO TABS: 25.0000 mg | ORAL_TABLET | Freq: Every day | ORAL | 1 refills | Status: DC

## 2019-11-21 NOTE — Progress Notes (Signed)
Pat

## 2019-11-21 NOTE — Progress Notes (Signed)
Subjective:    [name], male    DOB: 24-Jun-1928, 86 y.o.   MRN: [id number]  Chief Complaint  Patient presents with  . Medical Management of Chronic Issues     fasting, wants to see if he can come off any meds    Pt presents to the office today for chronic follow up.  Hypertension This is a chronic problem. The current episode started more than 1 year ago. The problem has been waxing and waning since onset. The problem is uncontrolled. Associated symptoms include malaise/fatigue. Pertinent negatives include no peripheral edema or shortness of breath. Risk factors for coronary artery disease include dyslipidemia and sedentary lifestyle. The current treatment provides moderate improvement. Hypertensive end-organ damage includes CAD/MI.  Arthritis Presents for follow-up visit. He complains of pain and stiffness. Affected location: back. His pain is at a severity of 8/10.  Hyperlipidemia This is a chronic problem. The current episode started more than 1 year ago. The problem is controlled. Recent lipid tests were reviewed and are normal. Pertinent negatives include no shortness of breath. Current antihyperlipidemic treatment includes statins. The current treatment provides moderate improvement of lipids. Risk factors for coronary artery disease include dyslipidemia, male sex, hypertension and a sedentary lifestyle.  Anemia Presents for follow-up visit. Symptoms include malaise/fatigue.      Review of Systems  Constitutional: Positive for malaise/fatigue.  Respiratory: Negative for shortness of breath.   Musculoskeletal: Positive for arthritis and stiffness.  All other systems reviewed and are negative.      Objective:   Physical Exam Vitals reviewed.  Constitutional:      General: He is not in acute distress.    Appearance: He is well-developed.  HENT:     Head: Normocephalic.     Right Ear: Tympanic membrane normal.     Left Ear: Tympanic membrane normal.    Eyes:     General:        Right eye: No discharge.        Left eye: No discharge.     Pupils: Pupils are equal, round, and reactive to light.  Neck:     Thyroid: No thyromegaly.  Cardiovascular:     Rate and Rhythm: Normal rate and regular rhythm.     Heart sounds: Normal heart sounds. No murmur heard.   Pulmonary:     Effort: Pulmonary effort is normal. No respiratory distress.     Breath sounds: Normal breath sounds. No wheezing.  Abdominal:     General: Bowel sounds are normal. There is no distension.     Palpations: Abdomen is soft.     Tenderness: There is no abdominal tenderness.  Musculoskeletal:        General: No tenderness. Normal range of motion.     Cervical back: Normal range of motion and neck supple.  Skin:    General: Skin is warm and dry.     Findings: No erythema or rash.  Neurological:     Mental Status: He is alert and oriented to person, place, and time.     Cranial Nerves: No cranial nerve deficit.     Deep Tendon Reflexes: Reflexes are normal and symmetric.  Psychiatric:        Behavior: Behavior normal.        Thought Content: Thought content normal.        Judgment: Judgment normal.       BP (!) 161/71   Pulse 74   Temp 97.7 F (36.5 C) (  Temporal)   Ht _0  (1.778 m)   Wt 137 lb (62.1 kg)   SpO2 98%   BMI 19.66 kg/m      Assessment & Plan:  Antoni Stefan comes in today with chief complaint of Medical Management of Chronic Issues ( fasting, wants to see if he can come off any meds )   Diagnosis and orders addressed:  1. Essential hypertension - hydrochlorothiazide (HYDRODIURIL) 25 MG tablet; Take 1 tablet (25 mg total) by mouth daily.  Dispense: 90 tablet; Refill: 1 - amLODipine (NORVASC) 5 MG tablet; Take 1 tablet (5 mg total) by mouth daily.  Dispense: 90 tablet; Refill: 1 - CMP14+EGFR - CBC with Differential/Platelet  2. LBBB (left bundle branch block) - CMP14+EGFR - CBC with Differential/Platelet  3. Thoracic  aortic atherosclerosis (HCC) - CMP14+EGFR - CBC with Differential/Platelet  4. Stage 3a chronic kidney disease ([facility]) - CMP14+EGFR - CBC with Differential/Platelet  5. Pure hypercholesterolemia - CMP14+EGFR - CBC with Differential/Platelet - Lipid panel  6. Vitamin D deficiency - CMP14+EGFR - VITAMIN D 25 Hydroxy (Vit-D Deficiency, Fractures)   Labs pending Health Maintenance reviewed Diet and exercise encouraged  Follow up plan: 6 months   Evelina Dun, FNP

## 2019-11-21 NOTE — Patient Instructions (Signed)

## 2019-11-22 DIAGNOSIS — R69 Illness, unspecified: Secondary | ICD-10-CM | POA: Diagnosis not present

## 2019-11-22 LAB — CMP14+EGFR
ALT: 17 IU/L (ref 0–44)
AST: 20 IU/L (ref 0–40)
Albumin/Globulin Ratio: 2 (ref 1.2–2.2)
Albumin: 4.2 g/dL (ref 3.6–4.6)
Alkaline Phosphatase: 66 IU/L (ref 44–121)
BUN/Creatinine Ratio: 18 (ref 10–24)
BUN: 20 mg/dL (ref 8–27)
Bilirubin Total: 0.5 mg/dL (ref 0.0–1.2)
CO2: 21 mmol/L (ref 20–29)
Calcium: 9.2 mg/dL (ref 8.6–10.2)
Chloride: 107 mmol/L — ABNORMAL HIGH (ref 96–106)
Creatinine, Ser: 1.12 mg/dL (ref 0.76–1.27)
GFR calc Af Amer: 67 mL/min/{1.73_m2} (ref >59)
GFR calc non Af Amer: 58 mL/min/{1.73_m2} — ABNORMAL LOW (ref >59)
Globulin, Total: 2.1 g/dL (ref 1.5–4.5)
Glucose: 94 mg/dL (ref 65–99)
Potassium: 3.8 mmol/L (ref 3.5–5.2)
Sodium: 143 mmol/L (ref 134–144)
Total Protein: 6.3 g/dL (ref 6.0–8.5)

## 2019-11-22 LAB — CBC WITH DIFFERENTIAL/PLATELET
Basophils Absolute: 0.1 10*3/uL (ref 0.0–0.2)
Basos: 1 %
EOS (ABSOLUTE): 0.2 10*3/uL (ref 0.0–0.4)
Eos: 3 %
Hematocrit: 35.4 % — ABNORMAL LOW (ref 37.5–51.0)
Hemoglobin: 12.2 g/dL — ABNORMAL LOW (ref 13.0–17.7)
Immature Grans (Abs): 0 10*3/uL (ref 0.0–0.1)
Immature Granulocytes: 0 %
Lymphocytes Absolute: 1.4 10*3/uL (ref 0.7–3.1)
Lymphs: 17 %
MCH: 32.6 pg (ref 26.6–33.0)
MCHC: 34.5 g/dL (ref 31.5–35.7)
MCV: 95 fL (ref 79–97)
Monocytes Absolute: 0.6 10*3/uL (ref 0.1–0.9)
Monocytes: 7 %
Neutrophils Absolute: 6.3 10*3/uL (ref 1.4–7.0)
Neutrophils: 72 %
Platelets: 289 10*3/uL (ref 150–450)
RBC: 3.74 x10E6/uL — ABNORMAL LOW (ref 4.14–5.80)
RDW: 12.2 % (ref 11.6–15.4)
WBC: 8.6 10*3/uL (ref 3.4–10.8)

## 2019-11-22 LAB — LIPID PANEL
Chol/HDL Ratio: 2.6 ratio (ref 0.0–5.0)
Cholesterol, Total: 143 mg/dL (ref 100–199)
HDL: 55 mg/dL (ref >39)
LDL Chol Calc (NIH): 68 mg/dL (ref 0–99)
Triglycerides: 112 mg/dL (ref 0–149)
VLDL Cholesterol Cal: 20 mg/dL (ref 5–40)

## 2019-11-22 LAB — VITAMIN D 25 HYDROXY (VIT D DEFICIENCY, FRACTURES): Vit D, 25-Hydroxy: 44.8 ng/mL (ref 30.0–100.0)

## 2019-12-05 ENCOUNTER — Ambulatory Visit (INDEPENDENT_AMBULATORY_CARE_PROVIDER_SITE_OTHER): Payer: Medicare HMO | Admitting: *Deleted

## 2019-12-05 VITALS — Ht 70.0 in | Wt 136.9 lb

## 2019-12-05 DIAGNOSIS — Z0000 Encounter for general adult medical examination without abnormal findings: Secondary | ICD-10-CM

## 2019-12-05 NOTE — Patient Instructions (Signed)
  [place] Maintenance Summary and Written Plan of Care  Benjamin Buchanan ,  Thank you for allowing me to perform your Medicare Annual Wellness Visit and for your ongoing commitment to your health.   Health Maintenance & Immunization History Health Maintenance  Topic Date Due  . INFLUENZA VACCINE  04/12/2020 (Originally 08/14/2019)  . COLONOSCOPY  11/20/2020 (Originally 11/14/2015)  . TETANUS/TDAP  10/23/2020  . COVID-19 Vaccine  Completed  . PNA vac Low Risk Adult  Completed   Immunization History  Administered Date(s) Administered  . Fluad Quad(high Dose 65+) 10/29/2018  . Influenza, High Dose Seasonal PF 11/18/2016, 10/19/2017  . Influenza,inj,Quad PF,6+ Mos 12/14/2012, 11/01/2013, 12/06/2014  . Influenza-Unspecified 11/29/2008, 10/24/2009, 10/24/2010, 10/20/2011, 11/09/2015  . Moderna SARS-COVID-2 Vaccination 02/14/2019, 03/14/2019  . Pneumococcal Conjugate-13 07/19/2013  . Pneumococcal Polysaccharide-23 10/24/2009  . Td 10/24/2010  . Zoster 11/05/2011    These are the patient goals that we discussed: Goals Addressed            This Visit's Progress   . Exercise 3x per week (30 min per time)       12/05/2019 AWV Goal: Exercise for General Health   Patient will verbalize understanding of the benefits of increased physical activity:  Exercising regularly is important. It will improve your overall fitness, flexibility, and endurance.  Regular exercise also will improve your overall health. It can help you control your weight, reduce stress, and improve your bone density.  Over the next year, patient will increase physical activity as tolerated with a goal of at least 150 minutes of moderate physical activity per week.   You can tell that you are exercising at a moderate intensity if your heart starts beating faster and you start breathing faster but can still hold a conversation.  Moderate-intensity exercise ideas include:  Walking 1 mile (1.6  km) in about 15 minutes  Biking  Hiking  Golfing  Dancing  Water aerobics  Patient will verbalize understanding of everyday activities that increase physical activity by providing examples like the following: ? Yard work, such as: ? Pushing a Conservation officer, nature ? Raking and bagging leaves ? Washing your car ? Pushing a stroller ? Shoveling snow ? Gardening ? Washing windows or floors  Patient will be able to explain general safety guidelines for exercising:   Before you start a new exercise program, talk with your health care provider.  Do not exercise so much that you hurt yourself, feel dizzy, or get very short of breath.  Wear comfortable clothes and wear shoes with good support.  Drink plenty of water while you exercise to prevent dehydration or heat stroke.  Work out until your breathing and your heartbeat get faster.         This is a list of Health Maintenance Items that are overdue or due now: There are no preventive care reminders to display for this patient.   Orders/Referrals Placed Today: No orders of the defined types were placed in this encounter.  (Contact our referral department at [phone] if you have not spoken with someone about your referral appointment within the next 5 days)    Follow-up Plan Follow up with Evelina Dun, FNP as scheduled Try to exercise for at least 30 minutes, 3 times weekly

## 2019-12-05 NOTE — Progress Notes (Signed)
MEDICARE ANNUAL WELLNESS VISIT  12/05/2019  Telephone Visit Disclaimer This Medicare AWV was conducted by telephone due to national recommendations for restrictions regarding the COVID-19 Pandemic (e.g. social distancing).  I verified, using two identifiers, that I am speaking with Benjamin Buchanan or their authorized healthcare agent. I discussed the limitations, risks, security, and privacy concerns of performing an evaluation and management service by telephone and the potential availability of an in-person appointment in the future. The patient expressed understanding and agreed to proceed.  Location of Patient: Home Location of Provider (nurse):  Office  Subjective:    Benjamin Buchanan is a 86 y.o. male patient of Hawks, Theador Hawthorne, FNP who had a Medicare Annual Wellness Visit today via telephone. Vipul is Retired and lives alone. he has 1 child. he reports that he is socially active and does interact with friends/family regularly. he is not physically active and enjoys music.  Patient Care Team: Sharion Balloon, FNP as PCP - General (Family Medicine) Larey Dresser, MD as Consulting Physician (Cardiology)  Advanced Directives 12/05/2019 07/07/2019 12/02/2018 02/10/2018 02/08/2018 10/23/2016 10/16/2016  Does Patient Have a Medical Advance Directive? Yes No;Yes No Yes Yes Yes Yes  Type of Paramedic of [place];Living will Living will;Healthcare Power of [place];Living will [place];Living will Living will -  Does patient want to make changes to medical advance directive? No - Patient declined - - No - Patient declined No - Patient declined No - Patient declined -  Copy of [place] in Chart? No - copy requested - - No - copy requested No - copy requested No - copy requested No - copy requested  Would patient like information on creating a medical advance directive? - - No -  Patient declined - - - [facility] Utilization Over the Past 12 Months: # of hospitalizations or ER visits: 0 # of surgeries: 1  Review of Systems    Patient reports that his overall health is unchanged compared to last year.  History obtained from chart review and the patient General ROS: negative  Patient Reported Readings (BP, Pulse, CBG, Weight, etc) none  Pain Assessment Pain : No/denies pain     Current Medications & Allergies (verified) Allergies as of 12/05/2019      Reactions   Lipitor [atorvastatin] Other (See Comments)   myalgia      Medication List       Accurate as of December 05, 2019  9:06 AM. If you have any questions, ask your nurse or doctor.        acetaminophen 325 MG tablet Commonly known as: TYLENOL Take 325 mg by mouth 2 (two) times daily as needed for moderate pain.   amLODipine 5 MG tablet Commonly known as: NORVASC Take 1 tablet (5 mg total) by mouth daily.   aspirin 81 MG tablet Take 81 mg by mouth every Monday, Wednesday, and Friday.   benazepril 20 MG tablet Commonly known as: LOTENSIN TAKE 1 TABLET BY MOUTH EVERY DAY   Cholecalciferol 25 MCG (1000 UT) capsule Take 1,000 Units by mouth daily.   hydrochlorothiazide 25 MG tablet Commonly known as: HYDRODIURIL Take 1 tablet (25 mg total) by mouth daily.   Integra Plus Caps TAKE 1 CAPSULE BY MOUTH EVERY DAY   OneTouch Ultra test strip Generic drug: glucose blood CHECK BLOOD SUGAR EVERY DAY AND AS NEEDED   rosuvastatin 5 MG tablet Commonly  known as: CRESTOR TAKE 1 TABLET BY MOUTH EVERY DAY IN THE EVENING   vitamin C 500 MG tablet Commonly known as: ASCORBIC ACID Take 500 mg by mouth 2 (two) times daily.       History (reviewed): Past Medical History:  Diagnosis Date  . Anemia   . History of prostate cancer 1994  . HLD (hyperlipidemia)   . HTN (hypertension)   . PVC (premature ventricular contraction)    history of   Past Surgical History:  Procedure  Laterality Date  . HERNIA REPAIR    . INGUINAL HERNIA REPAIR Left 10/23/2016   Procedure: LEFT INGUINAL HERNIA REPAIR WITH MESH  ;  Surgeon: Rolm Bookbinder, MD;  Location: [facility];  Service: General;  Laterality: Left;  . INSERTION OF MESH Left 10/23/2016   Procedure: INSERTION OF MESH;  Surgeon: Rolm Bookbinder, MD;  Location: [facility];  Service: General;  Laterality: Left;  . LUMBAR LAMINECTOMY/DECOMPRESSION MICRODISCECTOMY N/A 02/10/2018   Procedure: L2-3, L3-4 and L4-5 complete decompressive lumbar laminectomies for spinal stenosis, Foraminotomies  for L3, L4, L5 nerve roots.;  Surgeon: Latanya Maudlin, MD;  Location: [facility];  Service: Orthopedics;  Laterality: N/A;  159min  . PROSTATE SURGERY     Family History  Problem Relation Age of Onset  . Cancer Father        prostate cancer  . Hypertension Other        family history   Social History   Socioeconomic History  . Marital status: Widowed    Spouse name: Not on file  . Number of children: 2  . Years of education: 10  . Highest education level: 10th grade  Occupational History  . Occupation: retired  Tobacco Use  . Smoking status: Never Smoker  . Smokeless tobacco: Never Used  Vaping Use  . Vaping Use: Never used  Substance and Sexual Activity  . Alcohol use: No  . Drug use: No  . Sexual activity: Not Currently  Other Topics Concern  . Not on file  Social History Narrative   Retired    Scientist, physiological Strain: [place]   . Difficulty of Paying Living Expenses: Not hard at all  Food Insecurity: No Food Insecurity  . Worried About Charity fundraiser in the Last Year: Never true  . Ran Out of Food in the Last Year: Never true  Transportation Needs: No Transportation Needs  . Lack of Transportation (Medical): No  . Lack of Transportation (Non-Medical): No  Physical Activity: Inactive  . Days of Exercise per Week: 0 days  . Minutes of  Exercise per Session: 0 min  Stress: No Stress Concern Present  . Feeling of Stress : Not at all  Social Connections: Moderately Isolated  . Frequency of Communication with Friends and Family: More than three times a week  . Frequency of Social Gatherings with Friends and Family: More than three times a week  . Attends Religious Services: More than 4 times per year  . Active Member of Clubs or Organizations: No  . Attends Archivist Meetings: Never  . Marital Status: Widowed    Activities of Daily Living In your present state of health, do you have any difficulty performing the following activities: 12/05/2019  Hearing? N  Vision? N  Difficulty concentrating or making decisions? N  Walking or climbing stairs? Y  Comment Back surgery 1 year ago  Dressing or bathing? N  Doing errands, shopping?  N  Preparing Food and eating ? N  Using the Toilet? N  In the past six months, have you accidently leaked urine? N  Do you have problems with loss of bowel control? N  Managing your Medications? N  Managing your Finances? N  Housekeeping or managing your Housekeeping? N  Some recent data might be hidden    Patient Education/ Literacy How often do you need to have someone help you when you read instructions, pamphlets, or other written materials from your doctor or pharmacy?: 1 - Never What is the last grade level you completed in school?: 9th Grade  Exercise Current Exercise Habits: The patient does not participate in regular exercise at present, Exercise limited by: None identified  Diet Patient reports consuming 3 meals a day and 2 snack(s) a day Patient reports that his primary diet is: Regular Patient reports that she does have regular access to food.   Depression Screen PHQ 2/9 Scores 12/05/2019 11/21/2019 05/23/2019 12/02/2018 11/22/2018 10/19/2017 09/07/2017  PHQ - 2 Score 0 0 0 0 0 0 0     Fall Risk Fall Risk  12/05/2019 11/21/2019 05/23/2019 12/02/2018 11/22/2018    Falls in the past year? 0 0 0 0 0  Number falls in past yr: 0 - - - -  Injury with Fall? 0 - - - -  Risk for fall due to : No Fall Risks - - - -  Follow up Falls evaluation completed - - - -     Objective:  Benjamin Buchanan seemed alert and oriented and he participated appropriately during our telephone visit.  Blood Pressure Weight BMI  BP Readings from Last 3 Encounters:  11/21/19 130/75  07/07/19 (!) 174/71  05/23/19 (!) 145/78   Wt Readings from Last 3 Encounters:  12/05/19 136 lb 14.5 oz (62.1 kg)  11/21/19 137 lb (62.1 kg)  07/07/19 141 lb (64 kg)   BMI Readings from Last 1 Encounters:  12/05/19 19.64 kg/m    *Unable to obtain current vital signs, weight, and BMI due to telephone visit type  Hearing/Vision  . Khalif did  seem to have difficulty with hearing/understanding during the telephone conversation . Reports that he has not had a formal eye exam by an eye care professional within the past year . Reports that he has not had a formal hearing evaluation within the past year *Unable to fully assess hearing and vision during telephone visit type  Cognitive Function: 6CIT Screen 12/05/2019 12/02/2018  What Year? 0 points 0 points  What month? 0 points 0 points  What time? 0 points 0 points  Count back from 20 0 points 4 points  Months in reverse 2 points 0 points  Repeat phrase 0 points 2 points  Total Score 2 6   (Normal:0-7, Significant for Dysfunction: >8)  Normal Cognitive Function Screening: Yes   Immunization & Health Maintenance Record Immunization History  Administered Date(s) Administered  . Fluad Quad(high Dose 65+) 10/29/2018  . Influenza, High Dose Seasonal PF 11/18/2016, 10/19/2017  . Influenza,inj,Quad PF,6+ Mos 12/14/2012, 11/01/2013, 12/06/2014  . Influenza-Unspecified 11/29/2008, 10/24/2009, 10/24/2010, 10/20/2011, 11/09/2015  . Moderna SARS-COVID-2 Vaccination 02/14/2019, 03/14/2019  . Pneumococcal Conjugate-13 07/19/2013  .  Pneumococcal Polysaccharide-23 10/24/2009  . Td 10/24/2010  . Zoster 11/05/2011    Health Maintenance  Topic Date Due  . INFLUENZA VACCINE  04/12/2020 (Originally 08/14/2019)  . COLONOSCOPY  11/20/2020 (Originally 11/14/2015)  . TETANUS/TDAP  10/23/2020  . COVID-19 Vaccine  Completed  . PNA vac Low Risk  Adult  Completed       Assessment  This is a routine wellness examination for Keoki Mchargue.  Health Maintenance: Due or Overdue There are no preventive care reminders to display for this patient.  Benjamin Buchanan does not need a referral for Community Assistance: Care Management:   no Social Work:    no Prescription Assistance:  no Nutrition/Diabetes Education:  no   Plan:  Personalized Goals Goals Addressed            This Visit's Progress   . Exercise 3x per week (30 min per time)       12/05/2019 AWV Goal: Exercise for General Health   Patient will verbalize understanding of the benefits of increased physical activity:  Exercising regularly is important. It will improve your overall fitness, flexibility, and endurance.  Regular exercise also will improve your overall health. It can help you control your weight, reduce stress, and improve your bone density.  Over the next year, patient will increase physical activity as tolerated with a goal of at least 150 minutes of moderate physical activity per week.   You can tell that you are exercising at a moderate intensity if your heart starts beating faster and you start breathing faster but can still hold a conversation.  Moderate-intensity exercise ideas include:  Walking 1 mile (1.6 km) in about 15 minutes  Biking  Hiking  Golfing  Dancing  Water aerobics  Patient will verbalize understanding of everyday activities that increase physical activity by providing examples like the following: ? Yard work, such as: ? Pushing a Conservation officer, nature ? Raking and bagging leaves ? Washing your car ? Pushing a  stroller ? Shoveling snow ? Gardening ? Washing windows or floors  Patient will be able to explain general safety guidelines for exercising:   Before you start a new exercise program, talk with your health care provider.  Do not exercise so much that you hurt yourself, feel dizzy, or get very short of breath.  Wear comfortable clothes and wear shoes with good support.  Drink plenty of water while you exercise to prevent dehydration or heat stroke.  Work out until your breathing and your heartbeat get faster.       Personalized Health Maintenance & Screening Recommendations  Influenza vaccine Colorectal cancer screening  Lung Cancer Screening Recommended: no (Low Dose CT Chest recommended if Age 65-80 years, 30 pack-year currently smoking OR have quit w/in past 15 years) Hepatitis C Screening recommended: no HIV Screening recommended: no  Advanced Directives: Written information was not prepared per patient's request.  Referrals & Orders No orders of the defined types were placed in this encounter.   Follow-up Plan . Follow-up with Sharion Balloon, FNP as planned    I have personally reviewed and noted the following in the patient's chart:   . Medical and social history . Use of alcohol, tobacco or illicit drugs  . Current medications and supplements . Functional ability and status . Nutritional status . Physical activity . Advanced directives . List of other physicians . Hospitalizations, surgeries, and ER visits in previous 12 months . Vitals . Screenings to include cognitive, depression, and falls . Referrals and appointments  In addition, I have reviewed and discussed with Benjamin Buchanan certain preventive protocols, quality metrics, and best practice recommendations. A written personalized care plan for preventive services as well as general preventive health recommendations is available and can be mailed to the patient at his request.  Wardell Heath, LPN  [id number]  AVS printed and mailed to patient

## 2019-12-26 ENCOUNTER — Other Ambulatory Visit: Payer: Self-pay | Admitting: Family Medicine

## 2019-12-26 DIAGNOSIS — R69 Illness, unspecified: Secondary | ICD-10-CM | POA: Diagnosis not present

## 2020-02-10 ENCOUNTER — Telehealth: Payer: Self-pay

## 2020-02-10 DIAGNOSIS — I700 Atherosclerosis of aorta: Secondary | ICD-10-CM

## 2020-02-10 DIAGNOSIS — E7800 Pure hypercholesterolemia, unspecified: Secondary | ICD-10-CM

## 2020-02-10 MED ORDER — ROSUVASTATIN CALCIUM 5 MG PO TABS: ORAL_TABLET | ORAL | 0 refills | Status: DC

## 2020-02-10 NOTE — Telephone Encounter (Signed)
Patient takes Crestor MWF

## 2020-03-27 DIAGNOSIS — I129 Hypertensive chronic kidney disease with stage 1 through stage 4 chronic kidney disease, or unspecified chronic kidney disease: Secondary | ICD-10-CM | POA: Diagnosis not present

## 2020-03-27 DIAGNOSIS — G8929 Other chronic pain: Secondary | ICD-10-CM | POA: Diagnosis not present

## 2020-03-27 DIAGNOSIS — E785 Hyperlipidemia, unspecified: Secondary | ICD-10-CM | POA: Diagnosis not present

## 2020-03-27 DIAGNOSIS — M5430 Sciatica, unspecified side: Secondary | ICD-10-CM | POA: Diagnosis not present

## 2020-03-27 DIAGNOSIS — Z8249 Family history of ischemic heart disease and other diseases of the circulatory system: Secondary | ICD-10-CM | POA: Diagnosis not present

## 2020-03-27 DIAGNOSIS — N529 Male erectile dysfunction, unspecified: Secondary | ICD-10-CM | POA: Diagnosis not present

## 2020-03-27 DIAGNOSIS — M1990 Unspecified osteoarthritis, unspecified site: Secondary | ICD-10-CM | POA: Diagnosis not present

## 2020-03-27 DIAGNOSIS — Z7982 Long term (current) use of aspirin: Secondary | ICD-10-CM | POA: Diagnosis not present

## 2020-03-27 DIAGNOSIS — N189 Chronic kidney disease, unspecified: Secondary | ICD-10-CM | POA: Diagnosis not present

## 2020-03-27 DIAGNOSIS — Z809 Family history of malignant neoplasm, unspecified: Secondary | ICD-10-CM | POA: Diagnosis not present

## 2020-03-30 ENCOUNTER — Ambulatory Visit (INDEPENDENT_AMBULATORY_CARE_PROVIDER_SITE_OTHER): Payer: Medicare HMO | Admitting: Family Medicine

## 2020-03-30 ENCOUNTER — Encounter: Payer: Self-pay | Admitting: Family Medicine

## 2020-03-30 DIAGNOSIS — J069 Acute upper respiratory infection, unspecified: Secondary | ICD-10-CM | POA: Diagnosis not present

## 2020-03-30 MED ORDER — BENZONATATE 100 MG PO CAPS: 100.0000 mg | ORAL_CAPSULE | Freq: Two times a day (BID) | ORAL | 0 refills | Status: DC | PRN

## 2020-03-30 MED ORDER — DEXTROMETHORPHAN-GUAIFENESIN 10-100 MG/5ML PO SYRP: 5.0000 mL | ORAL_SOLUTION | Freq: Two times a day (BID) | ORAL | 0 refills | Status: DC

## 2020-03-30 NOTE — Progress Notes (Signed)
Virtual Visit via telephone Note  I connected with Benjamin Buchanan on 03/30/20 at 1358 by telephone and verified that I am speaking with the correct person using two identifiers. Benjamin Buchanan is currently located at home and patient are currently with her during visit. The provider, Fransisca Kaufmann Dettinger, MD is located in their office at time of visit.  Call ended at 1409  I discussed the limitations, risks, security and privacy concerns of performing an evaluation and management service by telephone and the availability of in person appointments. I also discussed with the patient that there may be a patient responsible charge related to this service. The patient expressed understanding and agreed to proceed.   History and Present Illness: Patient is having a cough and cold.  He is using cough medicine and cough drops and they are not helping.  He feels it is not improving.  He has this for 2 days.  He slept good and then had coughing spells and it is not coming up.  He denies fevers or chills or shortness of breath or wheezing.  He has covid and flu shots. He has never had asthma or lung disease.   No diagnosis found.  Outpatient Encounter Medications as of 03/30/2020  Medication Sig  . acetaminophen (TYLENOL) 325 MG tablet Take 325 mg by mouth 2 (two) times daily as needed for moderate pain.  Marland Kitchen amLODipine (NORVASC) 5 MG tablet Take 1 tablet (5 mg total) by mouth daily.  Marland Kitchen aspirin 81 MG tablet Take 81 mg by mouth every Monday, Wednesday, and Friday.   . benazepril (LOTENSIN) 20 MG tablet TAKE 1 TABLET BY MOUTH EVERY DAY  . Cholecalciferol 1000 UNITS capsule Take 1,000 Units by mouth daily.  Marland Kitchen FeFum-FePoly-FA-B Cmp-C-Biot (INTEGRA PLUS) CAPS TAKE 1 CAPSULE BY MOUTH EVERY DAY  . hydrochlorothiazide (HYDRODIURIL) 25 MG tablet Take 1 tablet (25 mg total) by mouth daily.  Glory Rosebush ULTRA test strip CHECK BLOOD SUGAR EVERY DAY AND AS NEEDED  . rosuvastatin (CRESTOR) 5 MG tablet TAKE  1 TABLET BY MOUTH MWF  . vitamin C (ASCORBIC ACID) 500 MG tablet Take 500 mg by mouth 2 (two) times daily.    No facility-administered encounter medications on file as of 03/30/2020.    Review of Systems  Constitutional: Negative for chills and fever.  HENT: Positive for congestion, postnasal drip and rhinorrhea. Negative for ear discharge, ear pain, sinus pressure, sneezing, sore throat and voice change.   Eyes: Negative for pain, discharge, redness and visual disturbance.  Respiratory: Positive for cough. Negative for shortness of breath and wheezing.   Cardiovascular: Negative for chest pain and leg swelling.  Musculoskeletal: Negative for gait problem.  Skin: Negative for rash.  All other systems reviewed and are negative.   Observations/Objective: Patient sounds comfortable and in no acute distress  Assessment and Plan: Problem List Items Addressed This Visit   None   Visit Diagnoses    Viral upper respiratory infection    -  Primary   Relevant Medications   Dextromethorphan-guaiFENesin (TUSSIN DM) 10-100 MG/5ML liquid   benzonatate (TESSALON) 100 MG capsule      Will treat like upper respiratory give 7 to help with cough, if anything worsens or does not improve he will give [place] call back. Follow up plan: Return if symptoms worsen or fail to improve.     I discussed the assessment and treatment plan with the patient. The patient was provided an opportunity to ask questions and all were  answered. The patient agreed with the plan and demonstrated an understanding of the instructions.   The patient was advised to call back or seek an in-person evaluation if the symptoms worsen or if the condition fails to improve as anticipated.  The above assessment and management plan was discussed with the patient. The patient verbalized understanding of and has agreed to the management plan. Patient is aware to call the clinic if symptoms persist or worsen. Patient is aware when to return  to the clinic for a follow-up visit. Patient educated on when it is appropriate to go to the emergency department.    I provided 11 minutes of non-face-to-face time during this encounter.    Worthy Rancher, MD

## 2020-05-22 ENCOUNTER — Ambulatory Visit (INDEPENDENT_AMBULATORY_CARE_PROVIDER_SITE_OTHER): Payer: Medicare HMO | Admitting: Family

## 2020-05-22 ENCOUNTER — Other Ambulatory Visit: Payer: Self-pay

## 2020-05-22 ENCOUNTER — Encounter: Payer: Self-pay | Admitting: Family

## 2020-05-22 VITALS — BP 150/66 | HR 77 | Temp 97.7°F | Ht 70.0 in | Wt 137.8 lb

## 2020-05-22 DIAGNOSIS — I10 Essential (primary) hypertension: Secondary | ICD-10-CM | POA: Diagnosis not present

## 2020-05-22 DIAGNOSIS — E559 Vitamin D deficiency, unspecified: Secondary | ICD-10-CM

## 2020-05-22 DIAGNOSIS — N1831 Chronic kidney disease, stage 3a: Secondary | ICD-10-CM

## 2020-05-22 DIAGNOSIS — E7800 Pure hypercholesterolemia, unspecified: Secondary | ICD-10-CM

## 2020-05-22 NOTE — Patient Instructions (Signed)
Health Maintenance After Age 85 After age 85, you are at a higher risk for certain long-term diseases and infections as well as injuries from falls. Falls are a major cause of broken bones and head injuries in people who are older than age 85. Getting regular preventive care can help to keep you healthy and well. Preventive care includes getting regular testing and making lifestyle changes as recommended by your health care provider. Talk with your health care provider about:  Which screenings and tests you should have. A screening is a test that checks for a disease when you have no symptoms.  A diet and exercise plan that is right for you. What should I know about screenings and tests to prevent falls? Screening and testing are the best ways to find a health problem early. Early diagnosis and treatment give you the best chance of managing medical conditions that are common after age 85. Certain conditions and lifestyle choices may make you more likely to have a fall. Your health care provider may recommend:  Regular vision checks. Poor vision and conditions such as cataracts can make you more likely to have a fall. If you wear glasses, make sure to get your prescription updated if your vision changes.  Medicine review. Work with your health care provider to regularly review all of the medicines you are taking, including over-the-counter medicines. Ask your health care provider about any side effects that may make you more likely to have a fall. Tell your health care provider if any medicines that you take make you feel dizzy or sleepy.  Osteoporosis screening. Osteoporosis is a condition that causes the bones to get weaker. This can make the bones weak and cause them to break more easily.  Blood pressure screening. Blood pressure changes and medicines to control blood pressure can make you feel dizzy.  Strength and balance checks. Your health care provider may recommend certain tests to check your  strength and balance while standing, walking, or changing positions.  Foot health exam. Foot pain and numbness, as well as not wearing proper footwear, can make you more likely to have a fall.  Depression screening. You may be more likely to have a fall if you have a fear of falling, feel emotionally low, or feel unable to do activities that you used to do.  Alcohol use screening. Using too much alcohol can affect your balance and may make you more likely to have a fall. What actions can I take to lower my risk of falls? General instructions  Talk with your health care provider about your risks for falling. Tell your health care provider if: ? You fall. Be sure to tell your health care provider about all falls, even ones that seem minor. ? You feel dizzy, sleepy, or off-balance.  Take over-the-counter and prescription medicines only as told by your health care provider. These include any supplements.  Eat a healthy diet and maintain a healthy weight. A healthy diet includes low-fat dairy products, low-fat (lean) meats, and fiber from whole grains, beans, and lots of fruits and vegetables. Home safety  Remove any tripping hazards, such as rugs, cords, and clutter.  Install safety equipment such as grab bars in bathrooms and safety rails on stairs.  Keep rooms and walkways well-lit. Activity  Follow a regular exercise program to stay fit. This will help you maintain your balance. Ask your health care provider what types of exercise are appropriate for you.  If you need a cane or walker,   use it as recommended by your health care provider.  Wear supportive shoes that have nonskid soles.   Lifestyle  Do not drink alcohol if your health care provider tells you not to drink.  If you drink alcohol, limit how much you have: ? 0-1 drink a day for women. ? 0-2 drinks a day for men.  Be aware of how much alcohol is in your drink. In the [place] one drink equals one typical bottle of beer (12  oz), one-half glass of wine (5 oz), or one shot of hard liquor (1 oz).  Do not use any products that contain nicotine or tobacco, such as cigarettes and e-cigarettes. If you need help quitting, ask your health care provider. Summary  Having a healthy lifestyle and getting preventive care can help to protect your health and wellness after age 85.  Screening and testing are the best way to find a health problem early and help you avoid having a fall. Early diagnosis and treatment give you the best chance for managing medical conditions that are more common for people who are older than age 85.  Falls are a major cause of broken bones and head injuries in people who are older than age 85. Take precautions to prevent a fall at home.  Work with your health care provider to learn what changes you can make to improve your health and wellness and to prevent falls. This information is not intended to replace advice given to you by your health care provider. Make sure you discuss any questions you have with your health care provider. Document Revised: 04/22/2018 Document Reviewed: 11/12/2016 Elsevier Patient Education  2021 Elsevier Inc.  

## 2020-05-22 NOTE — Progress Notes (Signed)
Subjective:    [name], male    DOB: 1928-10-05, 87 y.o.   MRN: [id number]  Chief Complaint  Patient presents with  . Medical Management of Chronic Issues    No concerns, fasting    Pt presents to the office today for chronic follow up. He reports he has not taken his medications this morning. His BP is slightly elevated.  Hypertension This is a chronic problem. The current episode started more than 1 year ago. The problem has been resolved since onset. The problem is controlled. Pertinent negatives include no malaise/fatigue, peripheral edema or shortness of breath. Risk factors for coronary artery disease include dyslipidemia and male gender. The current treatment provides moderate improvement. There is no history of CVA or heart failure.  Hyperlipidemia This is a chronic problem. The current episode started more than 1 year ago. The problem is controlled. Recent lipid tests were reviewed and are normal. Pertinent negatives include no shortness of breath. Current antihyperlipidemic treatment includes statins. The current treatment provides moderate improvement of lipids. Risk factors for coronary artery disease include dyslipidemia, male sex, hypertension and a sedentary lifestyle.      Review of Systems  Constitutional: Negative for malaise/fatigue.  Respiratory: Negative for shortness of breath.   All other systems reviewed and are negative.      Objective:   Physical Exam Vitals reviewed.  Constitutional:      General: He is not in acute distress.    Appearance: He is well-developed.  HENT:     Head: Normocephalic.     Right Ear: Tympanic membrane normal.     Left Ear: Tympanic membrane normal.  Eyes:     General:        Right eye: No discharge.        Left eye: No discharge.     Pupils: Pupils are equal, round, and reactive to light.  Neck:     Thyroid: No thyromegaly.  Cardiovascular:     Rate and Rhythm: Normal rate and regular rhythm.      Heart sounds: Normal heart sounds. No murmur heard.   Pulmonary:     Effort: Pulmonary effort is normal. No respiratory distress.     Breath sounds: Normal breath sounds. No wheezing.  Abdominal:     General: Bowel sounds are normal. There is no distension.     Palpations: Abdomen is soft.     Tenderness: There is no abdominal tenderness.  Musculoskeletal:        General: No tenderness. Normal range of motion.     Cervical back: Normal range of motion and neck supple.  Skin:    General: Skin is warm and dry.     Findings: No erythema or rash.  Neurological:     Mental Status: He is alert and oriented to person, place, and time.     Cranial Nerves: No cranial nerve deficit.     Deep Tendon Reflexes: Reflexes are normal and symmetric.  Psychiatric:        Behavior: Behavior normal.        Thought Content: Thought content normal.        Judgment: Judgment normal.       BP (!) 150/66   Pulse 77   Temp 97.7 F (36.5 C) (Temporal)   Ht '5\' 10"'  (1.778 m)   Wt 137 lb 12.8 oz (62.5 kg)   BMI 19.77 kg/m      Assessment & Plan:  Benjamin Buchanan comes  in today with chief complaint of Medical Management of Chronic Issues (No concerns, fasting )   Diagnosis and orders addressed:  1. Essential hypertension -Will hold HCTZ 25 mg RTO in 2-4 weeks  - CMP14+EGFR - CBC with Differential/Platelet  2. Stage 3a chronic kidney disease (HCC) - CMP14+EGFR - CBC with Differential/Platelet  3. Pure hypercholesterolemia - CMP14+EGFR - CBC with Differential/Platelet  4. Vitamin D deficiency - CMP14+EGFR - CBC with Differential/Platelet   Labs pending Health Maintenance reviewed Diet and exercise encouraged  Follow up plan: 2 weeks    Evelina Dun, FNP

## 2020-06-05 ENCOUNTER — Ambulatory Visit: Payer: Medicare HMO | Admitting: *Deleted

## 2020-06-05 ENCOUNTER — Other Ambulatory Visit: Payer: Self-pay

## 2020-06-05 DIAGNOSIS — I10 Essential (primary) hypertension: Secondary | ICD-10-CM

## 2020-06-05 NOTE — Progress Notes (Signed)
Pt comes in today wanting BP checked. Was taken off his HCTZ about 2 wks ago, feels like it has been steadily going up since he has been off of it Right arm 173/80 p 75 Left arm 162/71 p 68  He takes his meds in the morning then takes his BP about 10ish. Last couple readings are  06/02/20 830 am 148/88 06/03/20 10 am 157/85 06/04/20 8 am 157/86   1130 am 151/81  Please advise him if he should continue to remain off of his HCTZ   His next appt with Alyse Low is on 06/22/20

## 2020-06-20 ENCOUNTER — Other Ambulatory Visit: Payer: Self-pay | Admitting: Family

## 2020-06-20 DIAGNOSIS — I10 Essential (primary) hypertension: Secondary | ICD-10-CM

## 2020-06-22 ENCOUNTER — Encounter: Payer: Self-pay | Admitting: Family

## 2020-06-22 ENCOUNTER — Other Ambulatory Visit: Payer: Self-pay

## 2020-06-22 ENCOUNTER — Ambulatory Visit (INDEPENDENT_AMBULATORY_CARE_PROVIDER_SITE_OTHER): Payer: Medicare HMO | Admitting: Family

## 2020-06-22 VITALS — BP 166/77 | HR 97 | Temp 97.6°F | Ht 70.0 in | Wt 138.0 lb

## 2020-06-22 DIAGNOSIS — I10 Essential (primary) hypertension: Secondary | ICD-10-CM | POA: Diagnosis not present

## 2020-06-22 DIAGNOSIS — N1831 Chronic kidney disease, stage 3a: Secondary | ICD-10-CM | POA: Diagnosis not present

## 2020-06-22 DIAGNOSIS — E559 Vitamin D deficiency, unspecified: Secondary | ICD-10-CM | POA: Diagnosis not present

## 2020-06-22 DIAGNOSIS — E7800 Pure hypercholesterolemia, unspecified: Secondary | ICD-10-CM | POA: Diagnosis not present

## 2020-06-22 LAB — CMP14+EGFR
ALT: 11 IU/L (ref 0–44)
AST: 18 IU/L (ref 0–40)
Albumin/Globulin Ratio: 2.2 (ref 1.2–2.2)
Albumin: 4.4 g/dL (ref 3.5–4.6)
Alkaline Phosphatase: 74 IU/L (ref 44–121)
BUN/Creatinine Ratio: 18 (ref 10–24)
BUN: 18 mg/dL (ref 10–36)
Bilirubin Total: 0.6 mg/dL (ref 0.0–1.2)
CO2: 22 mmol/L (ref 20–29)
Calcium: 9.1 mg/dL (ref 8.6–10.2)
Chloride: 107 mmol/L — ABNORMAL HIGH (ref 96–106)
Creatinine, Ser: 0.98 mg/dL (ref 0.76–1.27)
Globulin, Total: 2 g/dL (ref 1.5–4.5)
Glucose: 95 mg/dL (ref 65–99)
Potassium: 3.9 mmol/L (ref 3.5–5.2)
Sodium: 145 mmol/L — ABNORMAL HIGH (ref 134–144)
Total Protein: 6.4 g/dL (ref 6.0–8.5)
eGFR: 73 mL/min/{1.73_m2} (ref >59)

## 2020-06-22 LAB — CBC WITH DIFFERENTIAL/PLATELET
Basophils Absolute: 0 10*3/uL (ref 0.0–0.2)
Basos: 1 %
EOS (ABSOLUTE): 0.1 10*3/uL (ref 0.0–0.4)
Eos: 2 %
Hematocrit: 36.1 % — ABNORMAL LOW (ref 37.5–51.0)
Hemoglobin: 11.9 g/dL — ABNORMAL LOW (ref 13.0–17.7)
Immature Grans (Abs): 0 10*3/uL (ref 0.0–0.1)
Immature Granulocytes: 0 %
Lymphocytes Absolute: 1.1 10*3/uL (ref 0.7–3.1)
Lymphs: 14 %
MCH: 31.1 pg (ref 26.6–33.0)
MCHC: 33 g/dL (ref 31.5–35.7)
MCV: 94 fL (ref 79–97)
Monocytes Absolute: 0.5 10*3/uL (ref 0.1–0.9)
Monocytes: 6 %
Neutrophils Absolute: 6.2 10*3/uL (ref 1.4–7.0)
Neutrophils: 77 %
Platelets: 280 10*3/uL (ref 150–450)
RBC: 3.83 x10E6/uL — ABNORMAL LOW (ref 4.14–5.80)
RDW: 13.1 % (ref 11.6–15.4)
WBC: 8 10*3/uL (ref 3.4–10.8)

## 2020-06-22 MED ORDER — AMLODIPINE BESYLATE 10 MG PO TABS
10.0000 mg | ORAL_TABLET | Freq: Every day | ORAL | 3 refills | Status: DC
Start: 2020-06-22 — End: 2021-09-05

## 2020-06-22 NOTE — Addendum Note (Signed)
Addended by: Evelina Dun A on: 06/22/2020 10:10 AM   Modules accepted: Orders

## 2020-06-22 NOTE — Patient Instructions (Signed)

## 2020-06-22 NOTE — Progress Notes (Signed)
   Subjective:    [name], male    DOB: [birth year], 87 y.o.   MRN: [id number]  Chief Complaint  Patient presents with   Hypertension   PT presents to the office today to recheck HTN. We stopped his HCTZ  mg.  Hypertension This is a chronic problem. The current episode started more than 1 year ago. The problem has been waxing and waning since onset. The problem is uncontrolled. Pertinent negatives include no malaise/fatigue, peripheral edema or shortness of breath. Risk factors for coronary artery disease include dyslipidemia and male gender. Past treatments include calcium channel blockers and ACE inhibitors. The current treatment provides mild improvement. There is no history of CVA or heart failure.     Review of Systems  Constitutional:  Negative for malaise/fatigue.  Respiratory:  Negative for shortness of breath.   All other systems reviewed and are negative.     Objective:   Physical Exam Vitals reviewed.  Constitutional:      General: He is not in acute distress.    Appearance: He is well-developed.  HENT:     Head: Normocephalic.  Eyes:     General:        Right eye: No discharge.        Left eye: No discharge.     Pupils: Pupils are equal, round, and reactive to light.  Neck:     Thyroid: No thyromegaly.  Cardiovascular:     Rate and Rhythm: Normal rate and regular rhythm.     Heart sounds: Normal heart sounds. No murmur heard. Pulmonary:     Effort: Pulmonary effort is normal. No respiratory distress.     Breath sounds: Normal breath sounds. No wheezing.  Abdominal:     General: Bowel sounds are normal. There is no distension.     Palpations: Abdomen is soft.     Tenderness: There is no abdominal tenderness.  Musculoskeletal:        General: No tenderness. Normal range of motion.     Cervical back: Normal range of motion and neck supple.  Skin:    General: Skin is warm and dry.     Findings: No erythema or rash.  Neurological:      Mental Status: He is alert and oriented to person, place, and time.     Cranial Nerves: No cranial nerve deficit.     Deep Tendon Reflexes: Reflexes are normal and symmetric.  Psychiatric:        Behavior: Behavior normal.        Thought Content: Thought content normal.        Judgment: Judgment normal.      BP (!) 166/77   Pulse 97   Temp 97.6 F (36.4 C) (Temporal)   Ht 5\' 10"  (1.778 m)   Wt 138 lb (62.6 kg)   SpO2 98%   BMI 19.80 kg/m      Assessment & Plan:   Benjamin Buchanan comes in today with chief complaint of Hypertension   Diagnosis and orders addressed:  1. Essential hypertension Will increase Norvasc to 10 mg from 5 mg  -Dash diet information given -Exercise encouraged - Stress Management  -Continue current meds -RTO in 2-4 weeks  - amLODipine (NORVASC) 10 MG tablet; Take 1 tablet (10 mg total) by mouth daily.  Dispense: 90 tablet; Refill: [place], FNP

## 2020-07-11 ENCOUNTER — Other Ambulatory Visit: Payer: Self-pay | Admitting: Family

## 2020-07-11 DIAGNOSIS — I10 Essential (primary) hypertension: Secondary | ICD-10-CM

## 2020-07-20 ENCOUNTER — Encounter: Payer: Self-pay | Admitting: Family

## 2020-07-20 ENCOUNTER — Other Ambulatory Visit: Payer: Self-pay

## 2020-07-20 ENCOUNTER — Ambulatory Visit (INDEPENDENT_AMBULATORY_CARE_PROVIDER_SITE_OTHER): Payer: Medicare HMO | Admitting: Family

## 2020-07-20 VITALS — BP 146/74 | HR 75 | Temp 98.0°F | Ht 70.0 in | Wt 141.2 lb

## 2020-07-20 DIAGNOSIS — I10 Essential (primary) hypertension: Secondary | ICD-10-CM | POA: Diagnosis not present

## 2020-07-20 NOTE — Progress Notes (Signed)
   Subjective:    [name], male    DOB: 02/09/1928, 87 y.o.   MRN: [id number]   Chief Complaint  Patient presents with   Hypertension   Pt presents to the office today to recheck HTN. Pt's was seen on 06/22/20 and we increased his Norvasc to 10 mg from 5 mg. However, patient states he has only been taking 7.5 mg.  Hypertension This is a chronic problem. The current episode started more than 1 year ago. The problem has been waxing and waning since onset. The problem is uncontrolled. Pertinent negatives include no malaise/fatigue, peripheral edema or shortness of breath. Past treatments include calcium channel blockers.     Review of Systems  Constitutional:  Negative for malaise/fatigue.  Respiratory:  Negative for shortness of breath.   All other systems reviewed and are negative.     Objective:   Physical Exam Vitals reviewed.  Constitutional:      General: He is not in acute distress.    Appearance: He is well-developed.  HENT:     Head: Normocephalic.     Right Ear: Tympanic membrane normal.     Left Ear: Tympanic membrane normal.  Eyes:     General:        Right eye: No discharge.        Left eye: No discharge.     Pupils: Pupils are equal, round, and reactive to light.  Neck:     Thyroid: No thyromegaly.  Cardiovascular:     Rate and Rhythm: Normal rate and regular rhythm.     Heart sounds: Normal heart sounds. No murmur heard. Pulmonary:     Effort: Pulmonary effort is normal. No respiratory distress.     Breath sounds: Normal breath sounds. No wheezing.  Abdominal:     General: Bowel sounds are normal. There is no distension.     Palpations: Abdomen is soft.     Tenderness: There is no abdominal tenderness.  Musculoskeletal:        General: No tenderness. Normal range of motion.     Cervical back: Normal range of motion and neck supple.  Skin:    General: Skin is warm and dry.     Findings: No erythema or rash.  Neurological:      Mental Status: He is alert and oriented to person, place, and time.     Cranial Nerves: No cranial nerve deficit.     Deep Tendon Reflexes: Reflexes are normal and symmetric.  Psychiatric:        Behavior: Behavior normal.        Thought Content: Thought content normal.        Judgment: Judgment normal.         BP (!) 146/74   Pulse 75   Temp 98 F (36.7 C) (Temporal)   Ht 5\' 10"  (1.778 m)   Wt 141 lb 3.2 oz (64 kg)   BMI 20.26 kg/m   Assessment & Plan:  Benjamin Buchanan comes in today with chief complaint of Hypertension   Diagnosis and orders addressed:  1. Essential hypertension PT will increase his Norvasc to 10 mg from 7.5 mg. If any dizziness or lightheadedness he will let us know.  -Dash diet information given -Exercise encouraged - Stress Management  -Continue current meds -RTO in 1 months    Evelina Dun, FNP

## 2020-07-20 NOTE — Patient Instructions (Signed)

## 2020-09-17 ENCOUNTER — Other Ambulatory Visit: Payer: Self-pay | Admitting: Family

## 2020-09-17 DIAGNOSIS — I10 Essential (primary) hypertension: Secondary | ICD-10-CM

## 2020-10-07 ENCOUNTER — Other Ambulatory Visit: Payer: Self-pay | Admitting: Family

## 2020-10-08 ENCOUNTER — Other Ambulatory Visit: Payer: Self-pay

## 2020-10-08 ENCOUNTER — Ambulatory Visit (INDEPENDENT_AMBULATORY_CARE_PROVIDER_SITE_OTHER): Payer: Medicare HMO | Admitting: Family Medicine

## 2020-10-08 VITALS — BP 146/78 | HR 98 | Temp 98.0°F | Ht 70.0 in | Wt 140.0 lb

## 2020-10-08 DIAGNOSIS — R21 Rash and other nonspecific skin eruption: Secondary | ICD-10-CM

## 2020-10-08 DIAGNOSIS — M7989 Other specified soft tissue disorders: Secondary | ICD-10-CM | POA: Diagnosis not present

## 2020-10-08 MED ORDER — CEPHALEXIN 500 MG PO CAPS: 500.0000 mg | ORAL_CAPSULE | Freq: Four times a day (QID) | ORAL | 0 refills | Status: AC

## 2020-10-08 MED ORDER — METHYLPREDNISOLONE ACETATE 40 MG/ML IJ SUSP
40.0000 mg | Freq: Once | INTRAMUSCULAR | Status: AC
  Administered 2020-10-08: 40 mg via INTRAMUSCULAR

## 2020-10-08 MED ORDER — PREDNISONE 10 MG (21) PO TBPK: ORAL_TABLET | ORAL | 0 refills | Status: DC

## 2020-10-08 MED ORDER — CEFTRIAXONE SODIUM 1 G IJ SOLR
1.0000 g | Freq: Once | INTRAMUSCULAR | Status: AC
  Administered 2020-10-08: 1 g via INTRAMUSCULAR

## 2020-10-08 NOTE — Progress Notes (Signed)
Subjective: CC: Rash PCP: Sharion Balloon, FNP VZC:HYIFOY Benjamin Buchanan is a 87 y.o. male presenting to clinic today for:  1.  Rash Patient reports several month history of rash that started on the lower extremities but resolved.  He has similar along his chest and upper extremities.  He describes diffuse itching.  Symptoms have been refractory to over-the-counter measures.  He only started developing right hand swelling over the last several days.  Denies any pain.   ROS: Per HPI  Allergies  Allergen Reactions   Lipitor [Atorvastatin] Other (See Comments)    myalgia   Past Medical History:  Diagnosis Date   Anemia    History of prostate cancer 1994   HLD (hyperlipidemia)    HTN (hypertension)    PVC (premature ventricular contraction)    history of    Current Outpatient Medications:    acetaminophen (TYLENOL) 325 MG tablet, Take 325 mg by mouth 2 (two) times daily as needed for moderate pain., Disp: , Rfl:    amLODipine (NORVASC) 10 MG tablet, Take 1 tablet (10 mg total) by mouth daily., Disp: 90 tablet, Rfl: 3   amLODipine (NORVASC) 5 MG tablet, TAKE 1 TABLET BY MOUTH EVERY DAY, Disp: 90 tablet, Rfl: 0   aspirin 81 MG tablet, Take 81 mg by mouth every Monday, Wednesday, and Friday. , Disp: , Rfl:    benazepril (LOTENSIN) 20 MG tablet, TAKE 1 TABLET BY MOUTH EVERY DAY, Disp: 90 tablet, Rfl: 0   Cholecalciferol 1000 UNITS capsule, Take 1,000 Units by mouth daily., Disp: , Rfl:    ONETOUCH ULTRA test strip, CHECK BLOOD SUGAR EVERY DAY AND AS NEEDED, Disp: 100 strip, Rfl: 11   rosuvastatin (CRESTOR) 5 MG tablet, TAKE 1 TABLET BY MOUTH MWF, Disp: 90 tablet, Rfl: 0   vitamin C (ASCORBIC ACID) 500 MG tablet, Take 500 mg by mouth 2 (two) times daily. , Disp: , Rfl:  Social History   Socioeconomic History   Marital status: Widowed    Spouse name: Not on file   Number of children: 2   Years of education: 10   Highest education level: 10th grade  Occupational History    Occupation: retired  Tobacco Use   Smoking status: Never   Smokeless tobacco: Never  Vaping Use   Vaping Use: Never used  Substance and Sexual Activity   Alcohol use: No   Drug use: No   Sexual activity: Not Currently  Other Topics Concern   Not on file  Social History Narrative   Retired    Investment banker, operational of Radio broadcast assistant Strain: Low Risk    Difficulty of Paying Living Expenses: Not hard at all  Food Insecurity: No Food Insecurity   Worried About Charity fundraiser in the Last Year: Never true   Arboriculturist in the Last Year: Never true  Transportation Needs: No Transportation Needs   Lack of Transportation (Medical): No   Lack of Transportation (Non-Medical): No  Physical Activity: Inactive   Days of Exercise per Week: 0 days   Minutes of Exercise per Session: 0 min  Stress: No Stress Concern Present   Feeling of Stress : Not at all  Social Connections: Moderately Isolated   Frequency of Communication with Friends and Family: More than three times a week   Frequency of Social Gatherings with Friends and Family: More than three times a week   Attends Religious Services: More than 4 times per year   Active  Member of Clubs or Organizations: No   Attends Archivist Meetings: Never   Marital Status: Widowed  Human resources officer Violence: Not At Risk   Fear of Current or Ex-Partner: No   Emotionally Abused: No   Physically Abused: No   Sexually Abused: No   Family History  Problem Relation Age of Onset   Cancer Father        prostate cancer   Hypertension Other        family history    Objective: Office vital signs reviewed. BP (!) 146/78   Pulse 98   Temp 98 F (36.7 C)   Ht 5\' 10"  (1.778 m)   Wt 140 lb (63.5 kg)   SpO2 98%   BMI 20.09 kg/m   Physical Examination:  General: Awake, alert, nontoxic-appearing male, No acute distress Skin: Rough, raised rash with several areas of healing excoriation noted along the upper  extremities.  Right hand with some visible soft tissue swelling.  No significant warmth.  No exudates.  Nontender to palpation.  He has similar rash along the chest with some petechial-like lesions as well.  Assessment/ Plan: 87 y.o. male   Rash and nonspecific skin eruption - Plan: methylPREDNISolone acetate (DEPO-MEDROL) injection 40 mg, cefTRIAXone (ROCEPHIN) injection 1 g  Swelling of right hand - Plan: methylPREDNISolone acetate (DEPO-MEDROL) injection 40 mg, cefTRIAXone (ROCEPHIN) injection 1 g  Uncertain etiology of rash.  Looks to be a dermatitis and this apparently has been present for many months.  Could consider checking a CBC as some of the lesions looked petechial in nature on the chest.  I treated him with a corticosteroid injection and he will start prednisone tomorrow.  He had some soft tissue swelling of the right hand.  This was nontender.  No exudates appreciated but will empirically treat with some antibiotics to ensure that he is not developing a secondary skin infection.  Rocephin administered.  He will start Keflex tomorrow.  We discussed red flag signs and symptoms warranting further evaluation.  Otherwise, plan for routine follow-up and recheck on Thursday.  He is aware of date and time  No orders of the defined types were placed in this encounter.  No orders of the defined types were placed in this encounter.    Janora Norlander, DO [place] [phone]

## 2020-10-08 NOTE — Patient Instructions (Addendum)
Not sure what has caused your rash but we are going to start you on a little bit of prednisone to see if we can get it under better control.   I think that the swelling in your right hand may be related to an early infection of the skin and therefore we are putting you on antibiotics Start the prednisone and cephalexin tomorrow, 10/09/20. You were given an injection of prednisone and antibiotic already today. See Alyse Low on Thursday, 9/29 at 11:40am for recheck Cellulitis, Adult Cellulitis is a skin infection. The infected area is often warm, red, swollen, and sore. It occurs most often in the arms and lower legs. It is very important to get treated for this condition. What are the causes? This condition is caused by bacteria. The bacteria enter through a break in the skin, such as a cut, burn, insect bite, open sore, or crack. What increases the risk? This condition is more likely to occur in people who: Have a weak body defense system (immune system). Have open cuts, burns, bites, or scrapes on the skin. Are older than 85 years of age. Have a blood sugar problem (diabetes). Have a long-lasting (chronic) liver disease (cirrhosis) or kidney disease. Are very overweight (obese). Have a skin problem, such as: Itchy rash (eczema). Slow movement of blood in the veins (venous stasis). Fluid buildup below the skin (edema). Have been treated with high-energy rays (radiation). Use IV drugs. What are the signs or symptoms? Symptoms of this condition include: Skin that is: Red. Streaking. Spotting. Swollen. Sore or painful when you touch it. Warm. A fever. Chills. Blisters. How is this diagnosed? This condition is diagnosed based on: Medical history. Physical exam. Blood tests. Imaging tests. How is this treated? Treatment for this condition may include: Medicines to treat infections or allergies. Home care, such as: Rest. Placing cold or warm cloths (compresses) on the  skin. Hospital care, if the condition is very bad. Follow these instructions at home: Medicines Take over-the-counter and prescription medicines only as told by your doctor. If you were prescribed an antibiotic medicine, take it as told by your doctor. Do not stop taking it even if you start to feel better. General instructions  Drink enough fluid to keep your pee (urine) pale yellow. Do not touch or rub the infected area. Raise (elevate) the infected area above the level of your heart while you are sitting or lying down. Place cold or warm cloths on the area as told by your doctor. Keep all follow-up visits as told by your doctor. This is important. Contact a doctor if: You have a fever. You do not start to get better after 1-2 days of treatment. Your bone or joint under the infected area starts to hurt after the skin has healed. Your infection comes back. This can happen in the same area or another area. You have a swollen bump in the area. You have new symptoms. You feel ill and have muscle aches and pains. Get help right away if: Your symptoms get worse. You feel very sleepy. You throw up (vomit) or have watery poop (diarrhea) for a long time. You see red streaks coming from the area. Your red area gets larger. Your red area turns dark in color. These symptoms may represent a serious problem that is an emergency. Do not wait to see if the symptoms will go away. Get medical help right away. Call your local emergency services (911 in the [place] Do not drive yourself to the hospital.  Summary Cellulitis is a skin infection. The area is often warm, red, swollen, and sore. This condition is treated with medicines, rest, and cold and warm cloths. Take all medicines only as told by your doctor. Tell your doctor if symptoms do not start to get better after 1-2 days of treatment. This information is not intended to replace advice given to you by your health care provider. Make sure you  discuss any questions you have with your health care provider. Document Revised: 05/21/2017 Document Reviewed: 05/21/2017 Elsevier Patient Education  [place].

## 2020-10-11 ENCOUNTER — Other Ambulatory Visit: Payer: Self-pay

## 2020-10-11 ENCOUNTER — Ambulatory Visit (INDEPENDENT_AMBULATORY_CARE_PROVIDER_SITE_OTHER): Payer: Medicare HMO | Admitting: Family

## 2020-10-11 ENCOUNTER — Encounter: Payer: Self-pay | Admitting: Family

## 2020-10-11 VITALS — BP 168/81 | HR 74 | Temp 98.2°F | Ht 70.0 in | Wt 145.0 lb

## 2020-10-11 DIAGNOSIS — I10 Essential (primary) hypertension: Secondary | ICD-10-CM | POA: Diagnosis not present

## 2020-10-11 DIAGNOSIS — L03119 Cellulitis of unspecified part of limb: Secondary | ICD-10-CM

## 2020-10-11 NOTE — Progress Notes (Signed)
Subjective:    [name], male    DOB: Jun 02, 1928, 87 y.o.   MRN: [id number]  Chief Complaint  Patient presents with   Follow-up    Rash Dr. Darnell Level saw     Pt presents to the office today to recheck cellulitis of right hand. He was given Rocephin 1 g and  methylprednisolone 40 mg in the office. He was given Keflex 500 mg QID and prednisone dose pack. His hand is greatly improved. Mild erythemas and no swelling. No fevers.  Wound Check There has been no drainage from the wound. The redness has improved. The swelling has improved. There is no pain present.  Hypertension This is a chronic problem. The current episode started more than 1 year ago. The problem has been waxing and waning since onset. Pertinent negatives include no malaise/fatigue, peripheral edema or shortness of breath.     Review of Systems  Constitutional:  Negative for malaise/fatigue.  Respiratory:  Negative for shortness of breath.   Skin:  Positive for rash.  All other systems reviewed and are negative.     Objective:   Physical Exam Vitals reviewed.  Constitutional:      General: He is not in acute distress.    Appearance: He is well-developed.  HENT:     Head: Normocephalic.  Eyes:     General:        Right eye: No discharge.        Left eye: No discharge.     Pupils: Pupils are equal, round, and reactive to light.  Neck:     Thyroid: No thyromegaly.  Cardiovascular:     Rate and Rhythm: Normal rate and regular rhythm.     Heart sounds: Normal heart sounds. No murmur heard. Pulmonary:     Effort: Pulmonary effort is normal. No respiratory distress.     Breath sounds: Normal breath sounds. No wheezing.  Abdominal:     General: Bowel sounds are normal. There is no distension.     Palpations: Abdomen is soft.     Tenderness: There is no abdominal tenderness.  Musculoskeletal:        General: No tenderness. Normal range of motion.     Cervical back: Normal range of motion and neck  supple.  Skin:    General: Skin is warm and dry.     Findings: Rash (right hand dryness, mild erythemas. No swelling or tenderness) present. No erythema.  Neurological:     Mental Status: He is alert and oriented to person, place, and time.     Cranial Nerves: No cranial nerve deficit.     Deep Tendon Reflexes: Reflexes are normal and symmetric.  Psychiatric:        Behavior: Behavior normal.        Thought Content: Thought content normal.        Judgment: Judgment normal.     BP (!) 168/81   Pulse 74   Temp 98.2 F (36.8 C) (Temporal)   Ht 5\' 10"  (1.778 m)   Wt 145 lb (65.8 kg)   BMI 20.81 kg/m       Assessment & Plan:  Javelle Donigan comes in today with chief complaint of Follow-up (Rash Dr. Darnell Level saw /)   Diagnosis and orders addressed:  1. Cellulitis of hand Improving Continue Keflex and prednisone  Call office if symptoms worsen or do not improve  2. Essential hypertension Continue medications and monitor at home    [facility],  FNP

## 2020-10-11 NOTE — Patient Instructions (Signed)

## 2020-10-23 ENCOUNTER — Other Ambulatory Visit: Payer: Self-pay

## 2020-10-23 ENCOUNTER — Encounter: Payer: Self-pay | Admitting: Family

## 2020-10-23 ENCOUNTER — Ambulatory Visit (INDEPENDENT_AMBULATORY_CARE_PROVIDER_SITE_OTHER): Payer: Medicare HMO | Admitting: Family

## 2020-10-23 VITALS — BP 136/80 | HR 90 | Temp 97.8°F | Resp 20 | Ht 70.0 in | Wt 137.0 lb

## 2020-10-23 DIAGNOSIS — N1831 Chronic kidney disease, stage 3a: Secondary | ICD-10-CM | POA: Diagnosis not present

## 2020-10-23 DIAGNOSIS — D508 Other iron deficiency anemias: Secondary | ICD-10-CM | POA: Diagnosis not present

## 2020-10-23 DIAGNOSIS — I10 Essential (primary) hypertension: Secondary | ICD-10-CM

## 2020-10-23 DIAGNOSIS — E559 Vitamin D deficiency, unspecified: Secondary | ICD-10-CM | POA: Diagnosis not present

## 2020-10-23 DIAGNOSIS — I700 Atherosclerosis of aorta: Secondary | ICD-10-CM | POA: Diagnosis not present

## 2020-10-23 DIAGNOSIS — M5137 Other intervertebral disc degeneration, lumbosacral region: Secondary | ICD-10-CM

## 2020-10-23 DIAGNOSIS — E7800 Pure hypercholesterolemia, unspecified: Secondary | ICD-10-CM | POA: Diagnosis not present

## 2020-10-23 NOTE — Patient Instructions (Signed)
Health Maintenance After Age 85 After age 85, you are at a higher risk for certain long-term diseases and infections as well as injuries from falls. Falls are a major cause of broken bones and head injuries in people who are older than age 85. Getting regular preventive care can help to keep you healthy and well. Preventive care includes getting regular testing and making lifestyle changes as recommended by your health care provider. Talk with your health care provider about: Which screenings and tests you should have. A screening is a test that checks for a disease when you have no symptoms. A diet and exercise plan that is right for you. What should I know about screenings and tests to prevent falls? Screening and testing are the best ways to find a health problem early. Early diagnosis and treatment give you the best chance of managing medical conditions that are common after age 85. Certain conditions and lifestyle choices may make you more likely to have a fall. Your health care provider may recommend: Regular vision checks. Poor vision and conditions such as cataracts can make you more likely to have a fall. If you wear glasses, make sure to get your prescription updated if your vision changes. Medicine review. Work with your health care provider to regularly review all of the medicines you are taking, including over-the-counter medicines. Ask your health care provider about any side effects that may make you more likely to have a fall. Tell your health care provider if any medicines that you take make you feel dizzy or sleepy. Osteoporosis screening. Osteoporosis is a condition that causes the bones to get weaker. This can make the bones weak and cause them to break more easily. Blood pressure screening. Blood pressure changes and medicines to control blood pressure can make you feel dizzy. Strength and balance checks. Your health care provider may recommend certain tests to check your strength and  balance while standing, walking, or changing positions. Foot health exam. Foot pain and numbness, as well as not wearing proper footwear, can make you more likely to have a fall. Depression screening. You may be more likely to have a fall if you have a fear of falling, feel emotionally low, or feel unable to do activities that you used to do. Alcohol use screening. Using too much alcohol can affect your balance and may make you more likely to have a fall. What actions can I take to lower my risk of falls? General instructions Talk with your health care provider about your risks for falling. Tell your health care provider if: You fall. Be sure to tell your health care provider about all falls, even ones that seem minor. You feel dizzy, sleepy, or off-balance. Take over-the-counter and prescription medicines only as told by your health care provider. These include any supplements. Eat a healthy diet and maintain a healthy weight. A healthy diet includes low-fat dairy products, low-fat (lean) meats, and fiber from whole grains, beans, and lots of fruits and vegetables. Home safety Remove any tripping hazards, such as rugs, cords, and clutter. Install safety equipment such as grab bars in bathrooms and safety rails on stairs. Keep rooms and walkways well-lit. Activity  Follow a regular exercise program to stay fit. This will help you maintain your balance. Ask your health care provider what types of exercise are appropriate for you. If you need a cane or walker, use it as recommended by your health care provider. Wear supportive shoes that have nonskid soles. Lifestyle Do not   drink alcohol if your health care provider tells you not to drink. If you drink alcohol, limit how much you have: 0-1 drink a day for women. 0-2 drinks a day for men. Be aware of how much alcohol is in your drink. In the [place] one drink equals one typical bottle of beer (12 oz), one-half glass of wine (5 oz), or one shot of  hard liquor (1 oz). Do not use any products that contain nicotine or tobacco, such as cigarettes and e-cigarettes. If you need help quitting, ask your health care provider. Summary Having a healthy lifestyle and getting preventive care can help to protect your health and wellness after age 85. Screening and testing are the best way to find a health problem early and help you avoid having a fall. Early diagnosis and treatment give you the best chance for managing medical conditions that are more common for people who are older than age 85. Falls are a major cause of broken bones and head injuries in people who are older than age 85. Take precautions to prevent a fall at home. Work with your health care provider to learn what changes you can make to improve your health and wellness and to prevent falls. This information is not intended to replace advice given to you by your health care provider. Make sure you discuss any questions you have with your health care provider. Document Revised: 03/09/2020 Document Reviewed: 12/16/2019 Elsevier Patient Education  2022 [employer]  

## 2020-10-23 NOTE — Progress Notes (Signed)
Subjective:    [name], male    DOB: 1928-06-29, 87 y.o.   MRN: [id number]  Chief Complaint  Patient presents with   Medical Management of Chronic Issues   Pt presents to the office today for chronic follow up. He has CKD and limits his NSAID's. He has aortic atherosclerosis and takes Crestor daily.  Hypertension This is a chronic problem. The current episode started more than 1 year ago. The problem has been resolved since onset. The problem is controlled. Pertinent negatives include no malaise/fatigue, peripheral edema or shortness of breath. Risk factors for coronary artery disease include dyslipidemia and male gender. The current treatment provides moderate improvement. Hypertensive end-organ damage includes kidney disease and CAD/MI.  Hyperlipidemia This is a chronic problem. The current episode started more than 1 year ago. The problem is controlled. Recent lipid tests were reviewed and are normal. Pertinent negatives include no shortness of breath. Current antihyperlipidemic treatment includes statins. The current treatment provides moderate improvement of lipids.  Back Pain This is a chronic problem. The current episode started more than 1 year ago. The problem occurs intermittently. The pain is present in the lumbar spine. The pain is at a severity of 0/10. The patient is experiencing no pain.     Review of Systems  Constitutional:  Negative for malaise/fatigue.  Respiratory:  Negative for shortness of breath.   Musculoskeletal:  Positive for back pain.  All other systems reviewed and are negative.     Objective:   Physical Exam Vitals reviewed.  Constitutional:      General: He is not in acute distress.    Appearance: He is well-developed.  HENT:     Head: Normocephalic.     Right Ear: Tympanic membrane normal.     Left Ear: Tympanic membrane normal.  Eyes:     General:        Right eye: No discharge.        Left eye: No discharge.      Pupils: Pupils are equal, round, and reactive to light.  Neck:     Thyroid: No thyromegaly.  Cardiovascular:     Rate and Rhythm: Normal rate and regular rhythm.     Heart sounds: Murmur heard.  Pulmonary:     Effort: Pulmonary effort is normal. No respiratory distress.     Breath sounds: Normal breath sounds. No wheezing.  Abdominal:     General: Bowel sounds are normal. There is no distension.     Palpations: Abdomen is soft.     Tenderness: There is no abdominal tenderness.  Musculoskeletal:        General: No tenderness. Normal range of motion.     Cervical back: Normal range of motion and neck supple.  Skin:    General: Skin is warm and dry.     Findings: No erythema or rash.  Neurological:     Mental Status: He is alert and oriented to person, place, and time.     Cranial Nerves: No cranial nerve deficit.     Deep Tendon Reflexes: Reflexes are normal and symmetric.  Psychiatric:        Behavior: Behavior normal.        Thought Content: Thought content normal.        Judgment: Judgment normal.         BP 136/80   Pulse 90   Temp 97.8 F (36.6 C) (Temporal)   Resp 20   Ht _0  (1.778 m)  Wt 137 lb (62.1 kg)   SpO2 98%   BMI 19.66 kg/m   Assessment & Plan:  Benjamin Buchanan comes in today with chief complaint of Medical Management of Chronic Issues   Diagnosis and orders addressed:  1. Essential hypertension - CMP14+EGFR  2. Thoracic aortic atherosclerosis (HCC) - CMP14+EGFR  3. Stage 3a chronic kidney disease (HCC) - CMP14+EGFR  4. Pure hypercholesterolemia - CMP14+EGFR  5. Vitamin D deficiency - CMP14+EGFR  6. Other iron deficiency anemia - CMP14+EGFR  7. Degeneration of lumbosacral intervertebral disc - CMP14+EGFR   Labs pending Health Maintenance reviewed Diet and exercise encouraged  Follow up plan: 6 months    Evelina Dun, FNP

## 2020-10-24 LAB — CMP14+EGFR
ALT: 20 IU/L (ref 0–44)
AST: 16 IU/L (ref 0–40)
Albumin/Globulin Ratio: 2.3 — ABNORMAL HIGH (ref 1.2–2.2)
Albumin: 4.1 g/dL (ref 3.5–4.6)
Alkaline Phosphatase: 85 IU/L (ref 44–121)
BUN/Creatinine Ratio: 22 (ref 10–24)
BUN: 26 mg/dL (ref 10–36)
Bilirubin Total: 0.6 mg/dL (ref 0.0–1.2)
CO2: 22 mmol/L (ref 20–29)
Calcium: 9.4 mg/dL (ref 8.6–10.2)
Chloride: 108 mmol/L — ABNORMAL HIGH (ref 96–106)
Creatinine, Ser: 1.17 mg/dL (ref 0.76–1.27)
Globulin, Total: 1.8 g/dL (ref 1.5–4.5)
Glucose: 97 mg/dL (ref 70–99)
Potassium: 4.4 mmol/L (ref 3.5–5.2)
Sodium: 144 mmol/L (ref 134–144)
Total Protein: 5.9 g/dL — ABNORMAL LOW (ref 6.0–8.5)
eGFR: 59 mL/min/{1.73_m2} — ABNORMAL LOW (ref >59)

## 2020-12-05 ENCOUNTER — Ambulatory Visit (INDEPENDENT_AMBULATORY_CARE_PROVIDER_SITE_OTHER): Payer: Medicare HMO

## 2020-12-05 VITALS — Ht 70.0 in | Wt 137.0 lb

## 2020-12-05 DIAGNOSIS — Z0000 Encounter for general adult medical examination without abnormal findings: Secondary | ICD-10-CM

## 2020-12-05 NOTE — Patient Instructions (Signed)
[name] , Thank you for taking time to come for your Medicare Wellness Visit. I appreciate your ongoing commitment to your health goals. Please review the following plan we discussed and let me know if I can assist you in the future.   Screening recommendations/referrals: Colonoscopy: Done 11/29/2010 - repeat not required Recommended yearly ophthalmology/optometry visit for glaucoma screening and checkup Recommended yearly dental visit for hygiene and checkup  Vaccinations: Influenza vaccine: Done 10/16/2020 - Repeat annually Pneumococcal vaccine: Done 10/24/2009 & 07/19/2013 Tdap vaccine: Done 10/24/2010 - Repeat in 10 years *due Shingles vaccine: Zostavax done 2013 - Due for Shingrix   Covid-19: Done 02/14/2019, 03/14/2019, 11/22/2019, & 10/16/2020  Advanced directives: Advance directive discussed with you today. Even though you declined this today, please call our office should you change your mind, and we can give you the proper paperwork for you to fill out.   Conditions/risks identified: Aim for 30 minutes of exercise or brisk walking each day, drink 6-8 glasses of water and eat lots of fruits and vegetables.   Next appointment: Follow up in one year for your annual wellness visit.   Preventive Care 85 Years and Older, Male  Preventive care refers to lifestyle choices and visits with your health care provider that can promote health and wellness. What does preventive care include? A yearly physical exam. This is also called an annual well check. Dental exams once or twice a year. Routine eye exams. Ask your health care provider how often you should have your eyes checked. Personal lifestyle choices, including: Daily care of your teeth and gums. Regular physical activity. Eating a healthy diet. Avoiding tobacco and drug use. Limiting alcohol use. Practicing safe sex. Taking low doses of aspirin every day. Taking vitamin and mineral supplements as recommended by your health care  provider. What happens during an annual well check? The services and screenings done by your health care provider during your annual well check will depend on your age, overall health, lifestyle risk factors, and family history of disease. Counseling  Your health care provider may ask you questions about your: Alcohol use. Tobacco use. Drug use. Emotional well-being. Home and relationship well-being. Sexual activity. Eating habits. History of falls. Memory and ability to understand (cognition). Work and work Statistician. Screening  You may have the following tests or measurements: Height, weight, and BMI. Blood pressure. Lipid and cholesterol levels. These may be checked every 5 years, or more frequently if you are over 27 years old. Skin check. Lung cancer screening. You may have this screening every year starting at age 68 if you have a 30-pack-year history of smoking and currently smoke or have quit within the past 15 years. Fecal occult blood test (FOBT) of the stool. You may have this test every year starting at age 63. Flexible sigmoidoscopy or colonoscopy. You may have a sigmoidoscopy every 5 years or a colonoscopy every 10 years starting at age 45. Prostate cancer screening. Recommendations will vary depending on your family history and other risks. Hepatitis C blood test. Hepatitis B blood test. Sexually transmitted disease (STD) testing. Diabetes screening. This is done by checking your blood sugar (glucose) after you have not eaten for a while (fasting). You may have this done every 1-3 years. Abdominal aortic aneurysm (AAA) screening. You may need this if you are a current or former smoker. Osteoporosis. You may be screened starting at age 24 if you are at high risk. Talk with your health care provider about your test results, treatment options,  and if necessary, the need for more tests. Vaccines  Your health care provider may recommend certain vaccines, such  as: Influenza vaccine. This is recommended every year. Tetanus, diphtheria, and acellular pertussis (Tdap, Td) vaccine. You may need a Td booster every 10 years. Zoster vaccine. You may need this after age 75. Pneumococcal 13-valent conjugate (PCV13) vaccine. One dose is recommended after age 98. Pneumococcal polysaccharide (PPSV23) vaccine. One dose is recommended after age 60. Talk to your health care provider about which screenings and vaccines you need and how often you need them. This information is not intended to replace advice given to you by your health care provider. Make sure you discuss any questions you have with your health care provider. Document Released: 01/26/2015 Document Revised: 09/19/2015 Document Reviewed: 10/31/2014 Elsevier Interactive Patient Education  2017 [place] Prevention in the Home Falls can cause injuries. They can happen to people of all ages. There are many things you can do to make your home safe and to help prevent falls. What can I do on the outside of my home? Regularly fix the edges of walkways and driveways and fix any cracks. Remove anything that might make you trip as you walk through a door, such as a raised step or threshold. Trim any bushes or trees on the path to your home. Use bright outdoor lighting. Clear any walking paths of anything that might make someone trip, such as rocks or tools. Regularly check to see if handrails are loose or broken. Make sure that both sides of any steps have handrails. Any raised decks and porches should have guardrails on the edges. Have any leaves, snow, or ice cleared regularly. Use sand or salt on walking paths during winter. Clean up any spills in your garage right away. This includes oil or grease spills. What can I do in the bathroom? Use night lights. Install grab bars by the toilet and in the tub and shower. Do not use towel bars as grab bars. Use non-skid mats or decals in the tub or  shower. If you need to sit down in the shower, use a plastic, non-slip stool. Keep the floor dry. Clean up any water that spills on the floor as soon as it happens. Remove soap buildup in the tub or shower regularly. Attach bath mats securely with double-sided non-slip rug tape. Do not have throw rugs and other things on the floor that can make you trip. What can I do in the bedroom? Use night lights. Make sure that you have a light by your bed that is easy to reach. Do not use any sheets or blankets that are too big for your bed. They should not hang down onto the floor. Have a firm chair that has side arms. You can use this for support while you get dressed. Do not have throw rugs and other things on the floor that can make you trip. What can I do in the kitchen? Clean up any spills right away. Avoid walking on wet floors. Keep items that you use a lot in easy-to-reach places. If you need to reach something above you, use a strong step stool that has a grab bar. Keep electrical cords out of the way. Do not use floor polish or wax that makes floors slippery. If you must use wax, use non-skid floor wax. Do not have throw rugs and other things on the floor that can make you trip. What can I do with my stairs? Do not leave  any items on the stairs. Make sure that there are handrails on both sides of the stairs and use them. Fix handrails that are broken or loose. Make sure that handrails are as long as the stairways. Check any carpeting to make sure that it is firmly attached to the stairs. Fix any carpet that is loose or worn. Avoid having throw rugs at the top or bottom of the stairs. If you do have throw rugs, attach them to the floor with carpet tape. Make sure that you have a light switch at the top of the stairs and the bottom of the stairs. If you do not have them, ask someone to add them for you. What else can I do to help prevent falls? Wear shoes that: Do not have high heels. Have  rubber bottoms. Are comfortable and fit you well. Are closed at the toe. Do not wear sandals. If you use a stepladder: Make sure that it is fully opened. Do not climb a closed stepladder. Make sure that both sides of the stepladder are locked into place. Ask someone to hold it for you, if possible. Clearly mark and make sure that you can see: Any grab bars or handrails. First and last steps. Where the edge of each step is. Use tools that help you move around (mobility aids) if they are needed. These include: Canes. Walkers. Scooters. Crutches. Turn on the lights when you go into a dark area. Replace any light bulbs as soon as they burn out. Set up your furniture so you have a clear path. Avoid moving your furniture around. If any of your floors are uneven, fix them. If there are any pets around you, be aware of where they are. Review your medicines with your doctor. Some medicines can make you feel dizzy. This can increase your chance of falling. Ask your doctor what other things that you can do to help prevent falls. This information is not intended to replace advice given to you by your health care provider. Make sure you discuss any questions you have with your health care provider. Document Released: 10/26/2008 Document Revised: 06/07/2015 Document Reviewed: 02/03/2014 Elsevier Interactive Patient Education  2017 [employer].

## 2020-12-05 NOTE — Progress Notes (Signed)
Subjective:   Benjamin Buchanan is a 87 y.o. male who presents for Medicare Annual/Subsequent preventive examination.  Virtual Visit via Telephone Note  I connected with  Benjamin Buchanan on 12/05/20 at  8:15 AM EST by telephone and verified that I am speaking with the correct person using two identifiers.  Location: Patient: Home Provider: [facility] Persons participating in the virtual visit: patient/Nurse Health Advisor   I discussed the limitations, risks, security and privacy concerns of performing an evaluation and management service by telephone and the availability of in person appointments. The patient expressed understanding and agreed to proceed.  Interactive audio and video telecommunications were attempted between this nurse and patient, however failed, due to patient having technical difficulties OR patient did not have access to video capability.  We continued and completed visit with audio only.  Some vital signs may be absent or patient reported.   Rizwan Kuyper [employer], LPN   Review of Systems     Cardiac Risk Factors include: advanced age (>73men, >16 women);male gender;sedentary lifestyle;hypertension;dyslipidemia;Other (see comment), Risk factor comments: atherosclerosis, anemia     Objective:    Today's Vitals   12/05/20 0819  Weight: 137 lb (62.1 kg)  Height: 5\' 10"  (1.778 m)   Body mass index is 19.66 kg/m.  Advanced Directives 12/05/2020 12/05/2019 07/07/2019 12/02/2018 02/10/2018 02/08/2018 10/23/2016  Does Patient Have a Medical Advance Directive? No Yes No;Yes No Yes Yes Yes  Type of Advance Directive - [place];Living will Living will;Healthcare Power of [place];Living will [place];Living will Living will  Does patient want to make changes to medical advance directive? - No - Patient declined - - No - Patient declined No - Patient declined No - Patient declined  Copy of [place] in Chart? - No - copy requested - - No - copy requested No - copy requested No - copy requested  Would patient like information on creating a medical advance directive? No - Patient declined - - No - Patient declined - - -    Current Medications (verified) Outpatient Encounter Medications as of 12/05/2020  Medication Sig   acetaminophen (TYLENOL) 325 MG tablet Take 325 mg by mouth 2 (two) times daily as needed for moderate pain.   amLODipine (NORVASC) 10 MG tablet Take 1 tablet (10 mg total) by mouth daily.   aspirin 81 MG tablet Take 81 mg by mouth every Monday, Wednesday, and Friday.    benazepril (LOTENSIN) 20 MG tablet TAKE 1 TABLET BY MOUTH EVERY DAY   Cholecalciferol 1000 UNITS capsule Take 1,000 Units by mouth daily.   ONETOUCH ULTRA test strip CHECK BLOOD SUGAR EVERY DAY AND AS NEEDED   rosuvastatin (CRESTOR) 5 MG tablet TAKE 1 TABLET BY MOUTH MWF   vitamin C (ASCORBIC ACID) 500 MG tablet Take 500 mg by mouth 2 (two) times daily.    No facility-administered encounter medications on file as of 12/05/2020.    Allergies (verified) Lipitor [atorvastatin]   History: Past Medical History:  Diagnosis Date   Anemia    History of prostate cancer 1994   HLD (hyperlipidemia)    HTN (hypertension)    PVC (premature ventricular contraction)    history of   Past Surgical History:  Procedure Laterality Date   HERNIA REPAIR     INGUINAL HERNIA REPAIR Left 10/23/2016   Procedure: LEFT INGUINAL HERNIA REPAIR WITH MESH  ;  Surgeon: Rolm Bookbinder, MD;  Location: [facility];  Service: General;  Laterality: Left;   INSERTION OF MESH Left 10/23/2016   Procedure: INSERTION OF MESH;  Surgeon: Rolm Bookbinder, MD;  Location: [facility];  Service: General;  Laterality: Left;   LUMBAR LAMINECTOMY/DECOMPRESSION MICRODISCECTOMY N/A 02/10/2018   Procedure: L2-3, L3-4 and L4-5 complete decompressive lumbar laminectomies for spinal stenosis,  Foraminotomies  for L3, L4, L5 nerve roots.;  Surgeon: Latanya Maudlin, MD;  Location: [facility];  Service: Orthopedics;  Laterality: N/A;  147min   PROSTATE SURGERY     Family History  Problem Relation Age of Onset   Cancer Father        prostate cancer   Hypertension Other        family history   Social History   Socioeconomic History   Marital status: Widowed    Spouse name: Not on file   Number of children: 2   Years of education: 10   Highest education level: 10th grade  Occupational History   Occupation: retired  Tobacco Use   Smoking status: Never   Smokeless tobacco: Never  Vaping Use   Vaping Use: Never used  Substance and Sexual Activity   Alcohol use: No   Drug use: No   Sexual activity: Not Currently  Other Topics Concern   Not on file  Social History Narrative   Retired. Lives alone. No family nearby, but he eats out for breakfast and dinner with friends several times per week. Stays active. Still very independent 11/2020   Social Determinants of Health   Financial Resource Strain: Low Risk    Difficulty of Paying Living Expenses: Not hard at all  Food Insecurity: No Food Insecurity   Worried About Charity fundraiser in the Last Year: Never true   Arboriculturist in the Last Year: Never true  Transportation Needs: No Transportation Needs   Lack of Transportation (Medical): No   Lack of Transportation (Non-Medical): No  Physical Activity: Insufficiently Active   Days of Exercise per Week: 7 days   Minutes of Exercise per Session: 20 min  Stress: No Stress Concern Present   Feeling of Stress : Not at all  Social Connections: Moderately Isolated   Frequency of Communication with Friends and Family: More than three times a week   Frequency of Social Gatherings with Friends and Family: More than three times a week   Attends Religious Services: Never   Marine scientist or Organizations: Yes   Attends Music therapist: More than 4 times per  year   Marital Status: Widowed    Tobacco Counseling Counseling given: Not Answered   Clinical Intake:  Pre-visit preparation completed: Yes  Pain : No/denies pain     BMI - recorded: 19.66 Nutritional Status: BMI of 19-24  Normal Nutritional Risks: None Diabetes: No  How often do you need to have someone help you when you read instructions, pamphlets, or other written materials from your doctor or pharmacy?: 1 - Never  Diabetic? no  Interpreter Needed?: No  Information entered by :: Geoge Lawrance, LPN   Activities of Daily Living In your present state of health, do you have any difficulty performing the following activities: 12/05/2020  Hearing? N  Vision? N  Difficulty concentrating or making decisions? Y  Comment a little  Walking or climbing stairs? Y  Comment a little  Dressing or bathing? N  Doing errands, shopping? N  Preparing Food and eating ? N  Using the Toilet? N  In the past six months, have you accidently leaked urine? N  Do you have problems with loss of bowel control? N  Managing your Medications? N  Managing your Finances? N  Housekeeping or managing your Housekeeping? N  Some recent data might be hidden    Patient Care Team: Sharion Balloon, FNP as PCP - General (Family Medicine) Larey Dresser, MD as Consulting Physician (Cardiology)  Indicate any recent Medical Services you may have received from other than [facility] providers in the past year (date may be approximate).     Assessment:   This is a routine wellness examination for Benjamin Buchanan.  Hearing/Vision screen Hearing Screening - Comments:: Denies hearing difficulties  Vision Screening - Comments:: Wears reading glasses prn only - no eye doctor  Dietary issues and exercise activities discussed: Current Exercise Habits: Home exercise routine, Type of exercise: walking, Time (Minutes): 20, Frequency (Times/Week): 7, Weekly Exercise (Minutes/Week): 140, Intensity: Mild, Exercise limited  by: orthopedic condition(s)   Goals Addressed             This Visit's Progress    Exercise 3x per week (30 min per time)   On track    12/05/2019 AWV Goal: Exercise for General Health  Patient will verbalize understanding of the benefits of increased physical activity: Exercising regularly is important. It will improve your overall fitness, flexibility, and endurance. Regular exercise also will improve your overall health. It can help you control your weight, reduce stress, and improve your bone density. Over the next year, patient will increase physical activity as tolerated with a goal of at least 150 minutes of moderate physical activity per week.  You can tell that you are exercising at a moderate intensity if your heart starts beating faster and you start breathing faster but can still hold a conversation. Moderate-intensity exercise ideas include: Walking 1 mile (1.6 km) in about 15 minutes Biking Hiking Golfing Dancing Water aerobics Patient will verbalize understanding of everyday activities that increase physical activity by providing examples like the following: Yard work, such as: Sales promotion account executive Gardening Washing windows or floors Patient will be able to explain general safety guidelines for exercising:  Before you start a new exercise program, talk with your health care provider. Do not exercise so much that you hurt yourself, feel dizzy, or get very short of breath. Wear comfortable clothes and wear shoes with good support. Drink plenty of water while you exercise to prevent dehydration or heat stroke. Work out until your breathing and your heartbeat get faster.        Depression Screen PHQ 2/9 Scores 12/05/2020 10/23/2020 10/11/2020 10/08/2020 07/20/2020 06/22/2020 05/22/2020  PHQ - 2 Score 0 0 0 0 0 0 0  PHQ- 9 Score 0 0 - 0 - - -    Fall Risk Fall Risk  12/05/2020 10/23/2020  10/11/2020 10/08/2020 06/22/2020  Falls in the past year? 0 0 0 0 0  Number falls in past yr: 0 - - - -  Injury with Fall? 0 - - - -  Risk for fall due to : Orthopedic patient - - - -  Follow up Falls prevention discussed - - - -    FALL [place]:  Any stairs in or around the home? Yes  If so, are there any without handrails? No  Home free of loose throw rugs in walkways, pet beds, electrical cords, etc? Yes  Adequate lighting in your home to reduce risk of falls? Yes   ASSISTIVE DEVICES UTILIZED TO PREVENT FALLS:  Life alert? No  Use of a cane, walker or w/c? No  Grab bars in the bathroom? Yes  Shower chair or bench in shower? Yes  Elevated toilet seat or a handicapped toilet? Yes   TIMED UP AND GO:  Was the test performed? No . Telephonic visit  Cognitive Function: MMSE - Mini Mental State Exam 02/21/2015 09/27/2014  Orientation to time 5 5  Orientation to Place 5 5  Registration 3 3  Attention/ Calculation 5 5  Recall 2 2  Language- name 2 objects 2 2  Language- repeat 1 1  Language- follow 3 step command 2 3  Language- read & follow direction 1 1  Write a sentence 1 1  Copy design 1 1  Total score 28 29     6CIT Screen 12/05/2020 12/05/2019 12/02/2018  What Year? 0 points 0 points 0 points  What month? 0 points 0 points 0 points  What time? 0 points 0 points 0 points  Count back from 20 0 points 0 points 4 points  Months in reverse 2 points 2 points 0 points  Repeat phrase 2 points 0 points 2 points  Total Score 4 2 6     Immunizations Immunization History  Administered Date(s) Administered   Fluad Quad(high Dose 65+) 10/29/2018, 10/16/2020   Influenza Split 10/16/2020   Influenza, High Dose Seasonal PF 11/18/2016, 10/19/2017   Influenza,inj,Quad PF,6+ Mos 12/14/2012, 11/01/2013, 12/06/2014   Influenza-Unspecified 11/29/2008, 10/24/2009, 10/20/2011, 11/09/2015, 11/22/2019   Moderna SARS-COV2 Booster Vaccination 10/16/2020    Moderna Sars-Covid-2 Vaccination 02/14/2019, 03/14/2019, 11/22/2019   Pneumococcal Conjugate-13 07/19/2013   Pneumococcal Polysaccharide-23 10/24/2009   Td 10/24/2010   Zoster, Live 11/05/2011    TDAP status: Due, Education has been provided regarding the importance of this vaccine. Advised may receive this vaccine at local pharmacy or Health Dept. Aware to provide a copy of the vaccination record if obtained from local pharmacy or Health Dept. Verbalized acceptance and understanding.  Flu Vaccine status: Up to date  Pneumococcal vaccine status: Up to date  Covid-19 vaccine status: Completed vaccines  Qualifies for Shingles Vaccine? Yes   Zostavax completed Yes   Shingrix Completed?: No.    Education has been provided regarding the importance of this vaccine. Patient has been advised to call insurance company to determine out of pocket expense if they have not yet received this vaccine. Advised may also receive vaccine at local pharmacy or Health Dept. Verbalized acceptance and understanding.  Screening Tests Health Maintenance  Topic Date Due   COLONOSCOPY (Pts 45-45yrs Insurance coverage will need to be confirmed)  11/14/2015   Zoster Vaccines- Shingrix (1 of 2) 01/23/2021 (Originally 02/18/1947)   TETANUS/TDAP  10/23/2021 (Originally 10/23/2020)   COVID-19 Vaccine (4 - Booster for Moderna series) 12/11/2020   Pneumonia Vaccine 22+ Years old  Completed   INFLUENZA VACCINE  Completed   HPV VACCINES  Aged Out    Health Maintenance  Health Maintenance Due  Topic Date Due   COLONOSCOPY (Pts 45-45yrs Insurance coverage will need to be confirmed)  11/14/2015    Colorectal cancer screening: No longer required.   Lung Cancer Screening: (Low Dose CT Chest recommended if Age 76-80 years, 30 pack-year currently smoking OR have quit w/in 15years.) does not qualify.   Additional Screening:  Hepatitis C Screening: does not qualify  Vision Screening: Recommended annual ophthalmology  exams for early detection of glaucoma  and other disorders of the eye. Is the patient up to date with their annual eye exam?  No  Who is the provider or what is the name of the office in which the patient attends annual eye exams? none If pt is not established with a provider, would they like to be referred to a provider to establish care? No .   Dental Screening: Recommended annual dental exams for proper oral hygiene  Community Resource Referral / Chronic Care Management: CRR required this visit?  No   CCM required this visit?  No      Plan:     I have personally reviewed and noted the following in the patient's chart:   Medical and social history Use of alcohol, tobacco or illicit drugs  Current medications and supplements including opioid prescriptions. Patient is not currently taking opioid prescriptions. Functional ability and status Nutritional status Physical activity Advanced directives List of other physicians Hospitalizations, surgeries, and ER visits in previous 12 months Vitals Screenings to include cognitive, depression, and falls Referrals and appointments  In addition, I have reviewed and discussed with patient certain preventive protocols, quality metrics, and best practice recommendations. A written personalized care plan for preventive services as well as general preventive health recommendations were provided to patient.     Sandrea Hammond, LPN   [id number]   Nurse Notes: For the last few weeks, his left foot and ankle swell, get a little red and itch. Both ankles swell a little, but left is worse. Usually better in mornings. Has been elevating and limiting salt. I advised to continue to monitor and let us know if it gets worse. Also sending note to on call

## 2021-01-02 ENCOUNTER — Other Ambulatory Visit: Payer: Self-pay | Admitting: Family

## 2021-01-04 ENCOUNTER — Other Ambulatory Visit: Payer: Self-pay | Admitting: Family

## 2021-01-04 DIAGNOSIS — I10 Essential (primary) hypertension: Secondary | ICD-10-CM

## 2021-02-21 ENCOUNTER — Encounter: Payer: Self-pay | Admitting: *Deleted

## 2021-03-29 ENCOUNTER — Other Ambulatory Visit: Payer: Self-pay | Admitting: Family

## 2021-03-29 DIAGNOSIS — E7800 Pure hypercholesterolemia, unspecified: Secondary | ICD-10-CM

## 2021-03-29 DIAGNOSIS — I700 Atherosclerosis of aorta: Secondary | ICD-10-CM

## 2021-03-29 MED ORDER — ROSUVASTATIN CALCIUM 5 MG PO TABS: ORAL_TABLET | ORAL | 0 refills | Status: DC

## 2021-03-29 NOTE — Addendum Note (Signed)
Addended by: Zannie Cove on: 03/29/2021 11:50 AM ? ? Modules accepted: Orders ? ?

## 2021-04-03 DIAGNOSIS — Z8546 Personal history of malignant neoplasm of prostate: Secondary | ICD-10-CM | POA: Diagnosis not present

## 2021-04-03 DIAGNOSIS — I10 Essential (primary) hypertension: Secondary | ICD-10-CM | POA: Diagnosis not present

## 2021-04-03 DIAGNOSIS — Z833 Family history of diabetes mellitus: Secondary | ICD-10-CM | POA: Diagnosis not present

## 2021-04-03 DIAGNOSIS — R69 Illness, unspecified: Secondary | ICD-10-CM | POA: Diagnosis not present

## 2021-04-03 DIAGNOSIS — Z008 Encounter for other general examination: Secondary | ICD-10-CM | POA: Diagnosis not present

## 2021-04-05 ENCOUNTER — Ambulatory Visit (INDEPENDENT_AMBULATORY_CARE_PROVIDER_SITE_OTHER): Payer: Medicare HMO | Admitting: Family

## 2021-04-05 ENCOUNTER — Telehealth: Payer: Self-pay | Admitting: Family

## 2021-04-05 ENCOUNTER — Encounter: Payer: Self-pay | Admitting: Family

## 2021-04-05 VITALS — BP 152/78 | HR 90 | Temp 98.0°F | Ht 70.0 in | Wt 140.0 lb

## 2021-04-05 DIAGNOSIS — R55 Syncope and collapse: Secondary | ICD-10-CM

## 2021-04-05 DIAGNOSIS — R6889 Other general symptoms and signs: Secondary | ICD-10-CM | POA: Diagnosis not present

## 2021-04-05 NOTE — Patient Instructions (Signed)
Syncope, Adult ?Syncope refers to a condition in which a person temporarily loses consciousness. Syncope may also be called fainting or passing out. It is caused by a sudden decrease in blood flow to the brain. This can happen for a variety of reasons. ?Most causes of syncope are not dangerous. It can be triggered by things such as needle sticks, seeing blood, pain, or intense emotion. However, syncope can also be a sign of a serious medical problem, such as a heart abnormality. Other causes can include dehydration, migraines, or taking medicines that lower blood pressure. Your health care provider may do tests to find the reason why you are having syncope. ?If you faint, get medical help right away. Call your local emergency services (911 in the [place] ?Follow these instructions at home: ?Pay attention to any changes in your symptoms. Take these actions to stay safe and to help relieve your symptoms: ?Knowing when you may be about to faint ?Signs that you may be about to faint include: ?Feeling dizzy, weak, light-headed, or like the room is spinning. ?Feeling nauseous. ?Seeing spots or seeing all white or all black in your field of vision. ?Having cold, clammy skin or feeling warm and sweaty. ?Hearing ringing in the ears (tinnitus). ?If you start to feel like you might faint, sit or lie down right away. If sitting, put your head down between your legs. If lying down, raise (elevate) your feet above the level of your heart. ?Breathe deeply and steadily. Wait until all the symptoms have passed. ?Have someone stay with you until you feel stable. ?Medicines ?Take over-the-counter and prescription medicines only as told by your health care provider. ?If you are taking blood pressure or heart medicine, get up slowly and take several minutes to sit and then stand. This can reduce dizziness and decrease the risk of syncope. ?Lifestyle ?Do not drive, use machinery, or play sports until your health care provider says it is  okay. ?Do not drink alcohol. ?Do not use any products that contain nicotine or tobacco. These products include cigarettes, chewing tobacco, and vaping devices, such as e-cigarettes. If you need help quitting, ask your health care provider. ?Avoid hot tubs and saunas. ?General instructions ?Talk with your health care provider about your symptoms. You may need to have testing to understand the cause of your syncope. ?Drink enough fluid to keep your urine pale yellow. ?Avoid prolonged standing. If you must stand for a long time, do movements such as: ?Moving your legs. ?Crossing your legs. ?Flexing and stretching your leg muscles. ?Squatting. ?Keep all follow-up visits. This is important. ?Contact a health care provider if: ?You have episodes of near fainting. ?Get help right away if: ?You faint. ?You hit your head or are injured after fainting. ?You have any of these symptoms that may indicate trouble with your heart: ?Fast or irregular heartbeats (palpitations). ?Unusual pain in your chest, abdomen, or back. ?Shortness of breath. ?You have a seizure. ?You have a severe headache. ?You are confused. ?You have vision problems. ?You have severe weakness or trouble walking. ?You are bleeding from your mouth or rectum, or you have black or tarry stool. ?These symptoms may represent a serious problem that is an emergency. Do not wait to see if your symptoms will go away. Get medical help right away. Call your local emergency services (911 in the [place] Do not drive yourself to the hospital. ?Summary ?Syncope refers to a condition in which a person temporarily loses consciousness. Syncope may also be called fainting  or passing out. It is caused by a sudden decrease in blood flow to the brain. ?Signs that you may be about to faint include dizziness, feeling light-headed, feeling nauseous, sudden vision changes, or cold, clammy skin. ?Even though most causes of syncope are not dangerous, syncope can be a sign of a serious  medical problem. Get help right away if you faint. ?If you start to feel like you might faint, sit or lie down right away. If sitting, put your head down between your legs. If lying down, raise (elevate) your feet above the level of your heart. ?This information is not intended to replace advice given to you by your health care provider. Make sure you discuss any questions you have with your health care provider. ?Document Revised: 05/10/2020 Document Reviewed: 05/10/2020 ?Elsevier Patient Education ? [place]. ? ?

## 2021-04-05 NOTE — Telephone Encounter (Signed)
Called and spoke with daughter will come in today to discuss daughter wants referral to neuro  ?

## 2021-04-05 NOTE — Progress Notes (Signed)
? ?Subjective:  ? ? [name], male    DOB: 1928/01/25, 88 y.o.   MRN: [id number] ? ?Chief Complaint  ?Patient presents with  ? Referral  ? ?Daughter brought patient today with complaints of syncope episode. States three days ago he was sitting on the couch talking with a friend, then all of sudden he went unresponsive for about 2 mins. He was stiff. Denies any shaking, incontinence. Patient does not recall any of this.   ?Loss of Consciousness ?This is a new problem. The current episode started in the past 7 days. He lost consciousness for a period of 1 to 5 minutes. The symptoms are aggravated by inactivity. Pertinent negatives include no back pain, bladder incontinence, bowel incontinence, chest pain, clumsiness, confusion, dizziness, focal weakness, headaches, light-headedness, malaise/fatigue, nausea, palpitations, slurred speech or visual change. He has tried bed rest for the symptoms. The treatment provided moderate relief.  ? ? ? ?Review of Systems  ?Constitutional:  Negative for malaise/fatigue.  ?Cardiovascular:  Positive for syncope. Negative for chest pain and palpitations.  ?Gastrointestinal:  Negative for bowel incontinence and nausea.  ?Genitourinary:  Negative for bladder incontinence.  ?Musculoskeletal:  Negative for back pain.  ?Neurological:  Negative for dizziness, focal weakness, light-headedness and headaches.  ?Psychiatric/Behavioral:  Negative for confusion.   ?All other systems reviewed and are negative. ? ?   ?Objective:  ? Physical Exam ?Vitals reviewed.  ?Constitutional:   ?   General: He is not in acute distress. ?   Appearance: He is well-developed.  ?HENT:  ?   Head: Normocephalic.  ?Eyes:  ?   General:     ?   Right eye: No discharge.     ?   Left eye: No discharge.  ?   Pupils: Pupils are equal, round, and reactive to light.  ?Neck:  ?   Thyroid: No thyromegaly.  ?Cardiovascular:  ?   Rate and Rhythm: Normal rate and regular rhythm.  ?   Heart sounds: Murmur  heard.  ?Pulmonary:  ?   Effort: Pulmonary effort is normal. No respiratory distress.  ?   Breath sounds: Normal breath sounds. No wheezing.  ?Abdominal:  ?   General: Bowel sounds are normal. There is no distension.  ?   Palpations: Abdomen is soft.  ?   Tenderness: There is no abdominal tenderness.  ?Musculoskeletal:     ?   General: No tenderness. Normal range of motion.  ?   Cervical back: Normal range of motion and neck supple.  ?Skin: ?   General: Skin is warm and dry.  ?   Findings: No erythema or rash.  ?Neurological:  ?   Mental Status: He is alert and oriented to person, place, and time.  ?   Cranial Nerves: No cranial nerve deficit.  ?   Deep Tendon Reflexes: Reflexes are normal and symmetric.  ?Psychiatric:     ?   Behavior: Behavior normal.     ?   Thought Content: Thought content normal.     ?   Judgment: Judgment normal.  ? ? ? ? ? ?  BP (!) 152/78   Pulse 90   Temp 98 ?F (36.7 ?C) (Temporal)   Ht '5\' 10"'  (1.778 m)   Wt 140 lb (63.5 kg)   BMI 20.09 kg/m?  ? ?Assessment & Plan:  ? ?Pellegrino Kennard comes in today with chief complaint of Referral ? ? ?Diagnosis and orders addressed: ? ?1. Syncope, unspecified syncope type ?Labs  pending  ?Fall precautions  ?Caution on driving ?- Ambulatory referral to Neurology ?- CMP14+EGFR ?- Anemia Profile B ?- TSH ?- EKG 12-Lead ? ? ? ? ?Evelina Dun, FNP ? ? ?

## 2021-04-06 LAB — CMP14+EGFR
ALT: 26 IU/L (ref 0–44)
AST: 22 IU/L (ref 0–40)
Albumin/Globulin Ratio: 2.2 (ref 1.2–2.2)
Albumin: 4.2 g/dL (ref 3.5–4.6)
Alkaline Phosphatase: 79 IU/L (ref 44–121)
BUN/Creatinine Ratio: 18 (ref 10–24)
BUN: 21 mg/dL (ref 10–36)
Bilirubin Total: 0.3 mg/dL (ref 0.0–1.2)
CO2: 21 mmol/L (ref 20–29)
Calcium: 9 mg/dL (ref 8.6–10.2)
Chloride: 109 mmol/L — ABNORMAL HIGH (ref 96–106)
Creatinine, Ser: 1.19 mg/dL (ref 0.76–1.27)
Globulin, Total: 1.9 g/dL (ref 1.5–4.5)
Glucose: 110 mg/dL — ABNORMAL HIGH (ref 70–99)
Potassium: 4.1 mmol/L (ref 3.5–5.2)
Sodium: 145 mmol/L — ABNORMAL HIGH (ref 134–144)
Total Protein: 6.1 g/dL (ref 6.0–8.5)
eGFR: 58 mL/min/{1.73_m2} — ABNORMAL LOW (ref >59)

## 2021-04-06 LAB — ANEMIA PROFILE B
Basophils Absolute: 0.1 10*3/uL (ref 0.0–0.2)
Basos: 1 %
EOS (ABSOLUTE): 0.2 10*3/uL (ref 0.0–0.4)
Eos: 2 %
Ferritin: 511 ng/mL — ABNORMAL HIGH (ref 30–400)
Folate: 17.3 ng/mL (ref >3.0)
Hematocrit: 36.2 % — ABNORMAL LOW (ref 37.5–51.0)
Hemoglobin: 12.3 g/dL — ABNORMAL LOW (ref 13.0–17.7)
Immature Grans (Abs): 0 10*3/uL (ref 0.0–0.1)
Immature Granulocytes: 0 %
Iron Saturation: 41 % (ref 15–55)
Iron: 90 ug/dL (ref 38–169)
Lymphocytes Absolute: 1.3 10*3/uL (ref 0.7–3.1)
Lymphs: 15 %
MCH: 32.1 pg (ref 26.6–33.0)
MCHC: 34 g/dL (ref 31.5–35.7)
MCV: 95 fL (ref 79–97)
Monocytes Absolute: 0.6 10*3/uL (ref 0.1–0.9)
Monocytes: 7 %
Neutrophils Absolute: 6.7 10*3/uL (ref 1.4–7.0)
Neutrophils: 75 %
Platelets: 333 10*3/uL (ref 150–450)
RBC: 3.83 x10E6/uL — ABNORMAL LOW (ref 4.14–5.80)
RDW: 13.1 % (ref 11.6–15.4)
Retic Ct Pct: 1.3 % (ref 0.6–2.6)
Total Iron Binding Capacity: 218 ug/dL — ABNORMAL LOW (ref 250–450)
UIBC: 128 ug/dL (ref 111–343)
Vitamin B-12: 168 pg/mL — ABNORMAL LOW (ref 232–1245)
WBC: 8.8 10*3/uL (ref 3.4–10.8)

## 2021-04-06 LAB — TSH: TSH: 1.02 u[IU]/mL (ref 0.450–4.500)

## 2021-04-08 ENCOUNTER — Telehealth: Payer: Self-pay | Admitting: Family

## 2021-04-08 NOTE — Telephone Encounter (Signed)
Appt made

## 2021-04-11 ENCOUNTER — Ambulatory Visit (INDEPENDENT_AMBULATORY_CARE_PROVIDER_SITE_OTHER): Payer: Medicare HMO | Admitting: *Deleted

## 2021-04-11 DIAGNOSIS — E538 Deficiency of other specified B group vitamins: Secondary | ICD-10-CM | POA: Diagnosis not present

## 2021-04-11 MED ORDER — CYANOCOBALAMIN 1000 MCG/ML IJ SOLN
1000.0000 ug | Freq: Every day | INTRAMUSCULAR | Status: AC
  Administered 2021-04-11 – 2021-04-17 (×5): 1000 ug via INTRAMUSCULAR

## 2021-04-11 NOTE — Progress Notes (Signed)
Vitamin b12 injection given and patient tolerated well.  

## 2021-04-12 ENCOUNTER — Ambulatory Visit (INDEPENDENT_AMBULATORY_CARE_PROVIDER_SITE_OTHER): Payer: Medicare HMO | Admitting: *Deleted

## 2021-04-12 DIAGNOSIS — E538 Deficiency of other specified B group vitamins: Secondary | ICD-10-CM

## 2021-04-15 ENCOUNTER — Ambulatory Visit (INDEPENDENT_AMBULATORY_CARE_PROVIDER_SITE_OTHER): Payer: Medicare HMO | Admitting: Family

## 2021-04-15 ENCOUNTER — Encounter: Payer: Self-pay | Admitting: Family

## 2021-04-15 VITALS — BP 135/78 | HR 89 | Temp 97.8°F | Ht 70.0 in | Wt 141.2 lb

## 2021-04-15 DIAGNOSIS — N1831 Chronic kidney disease, stage 3a: Secondary | ICD-10-CM

## 2021-04-15 DIAGNOSIS — M48061 Spinal stenosis, lumbar region without neurogenic claudication: Secondary | ICD-10-CM | POA: Diagnosis not present

## 2021-04-15 DIAGNOSIS — E7800 Pure hypercholesterolemia, unspecified: Secondary | ICD-10-CM

## 2021-04-15 DIAGNOSIS — I10 Essential (primary) hypertension: Secondary | ICD-10-CM | POA: Diagnosis not present

## 2021-04-15 DIAGNOSIS — E559 Vitamin D deficiency, unspecified: Secondary | ICD-10-CM | POA: Diagnosis not present

## 2021-04-15 DIAGNOSIS — E538 Deficiency of other specified B group vitamins: Secondary | ICD-10-CM

## 2021-04-15 DIAGNOSIS — I700 Atherosclerosis of aorta: Secondary | ICD-10-CM | POA: Diagnosis not present

## 2021-04-15 DIAGNOSIS — M5137 Other intervertebral disc degeneration, lumbosacral region: Secondary | ICD-10-CM

## 2021-04-15 DIAGNOSIS — Z23 Encounter for immunization: Secondary | ICD-10-CM

## 2021-04-15 NOTE — Patient Instructions (Signed)

## 2021-04-15 NOTE — Progress Notes (Signed)
? ?Subjective:  ? ? [name], male    DOB: 03/31/1928, 88 y.o.   MRN: [id number] ? ?Chief Complaint  ?Patient presents with  ? Medical Management of Chronic Issues  ? ?Pt presents to the office today for chronic follow up. He has CKD and limits his NSAID's. He has aortic atherosclerosis and takes Crestor daily.  ? ?He states his BP is always elevated when he comes to the office, but at this morning at home it was 135/78. ? ?He has a syncope episode a few weeks ago and has a follow up with Neurologists.  ?Hypertension ?This is a chronic problem. The current episode started more than 1 year ago. The problem has been waxing and waning since onset. The problem is controlled. Pertinent negatives include no malaise/fatigue, peripheral edema or shortness of breath. Risk factors for coronary artery disease include dyslipidemia and male gender. The current treatment provides moderate improvement.  ?Arthritis ?Presents for follow-up visit. He complains of pain and stiffness. The symptoms have been stable. Affected location: back. His pain is at a severity of 6/10.  ?Hyperlipidemia ?This is a chronic problem. The current episode started more than 1 year ago. The problem is controlled. Pertinent negatives include no shortness of breath. Current antihyperlipidemic treatment includes statins. The current treatment provides moderate improvement of lipids. Risk factors for coronary artery disease include dyslipidemia, hypertension and a sedentary lifestyle.  ?Anemia ?Presents for follow-up visit. There has been no malaise/fatigue.  ? ? ? ?Review of Systems  ?Constitutional:  Negative for malaise/fatigue.  ?Respiratory:  Negative for shortness of breath.   ?Musculoskeletal:  Positive for arthritis and stiffness.  ?All other systems reviewed and are negative. ? ?   ?Objective:  ? Physical Exam ?Vitals reviewed.  ?Constitutional:   ?   General: He is not in acute distress. ?   Appearance: He is well-developed.   ?HENT:  ?   Head: Normocephalic.  ?   Right Ear: Tympanic membrane normal.  ?   Left Ear: Tympanic membrane normal.  ?Eyes:  ?   General:     ?   Right eye: No discharge.     ?   Left eye: No discharge.  ?   Pupils: Pupils are equal, round, and reactive to light.  ?Neck:  ?   Thyroid: No thyromegaly.  ?Cardiovascular:  ?   Rate and Rhythm: Normal rate and regular rhythm.  ?   Heart sounds: Normal heart sounds. No murmur heard. ?Pulmonary:  ?   Effort: Pulmonary effort is normal. No respiratory distress.  ?   Breath sounds: Normal breath sounds. No wheezing.  ?Abdominal:  ?   General: Bowel sounds are normal. There is no distension.  ?   Palpations: Abdomen is soft.  ?   Tenderness: There is no abdominal tenderness.  ?Musculoskeletal:     ?   General: No tenderness. Normal range of motion.  ?   Cervical back: Normal range of motion and neck supple.  ?Skin: ?   General: Skin is warm and dry.  ?   Findings: No erythema or rash.  ?Neurological:  ?   Mental Status: He is alert and oriented to person, place, and time.  ?   Cranial Nerves: No cranial nerve deficit.  ?   Deep Tendon Reflexes: Reflexes are normal and symmetric.  ?Psychiatric:     ?   Behavior: Behavior normal.     ?   Thought Content: Thought content normal.     ?  Judgment: Judgment normal.  ? ? ? ? ? ?  BP (!) 164/69   Pulse 89   Temp 97.8 ?F (36.6 ?C) (Temporal)   Ht '5\' 10"'$  (1.778 m)   Wt 141 lb 3.2 oz (64 kg)   BMI 20.26 kg/m?  ? ?Assessment & Plan:  ?Benjamin Buchanan comes in today with chief complaint of Medical Management of Chronic Issues ? ? ?Diagnosis and orders addressed: ? ?1. Essential hypertension ? ?2. Thoracic aortic atherosclerosis ([place]) ? ?3. Degeneration of lumbosacral intervertebral disc ? ?4. Stage 3a chronic kidney disease ([place]) ? ?5. Pure hypercholesterolemia ? ?6. Vitamin D deficiency ? ?7. Spinal stenosis of lumbar region at multiple levels ? ? ?Labs pending ?Health Maintenance reviewed ?Diet and exercise  encouraged ? ?Follow up plan: ?3  months  ? ?Evelina Dun, FNP ? ? ? ?

## 2021-04-15 NOTE — Addendum Note (Signed)
Addended by: Ladean Raya on: 04/15/2021 08:40 AM ? ? Modules accepted: Orders ? ?

## 2021-04-16 ENCOUNTER — Ambulatory Visit (INDEPENDENT_AMBULATORY_CARE_PROVIDER_SITE_OTHER): Payer: Medicare HMO | Admitting: *Deleted

## 2021-04-16 DIAGNOSIS — E538 Deficiency of other specified B group vitamins: Secondary | ICD-10-CM | POA: Diagnosis not present

## 2021-04-16 NOTE — Progress Notes (Signed)
Pt given B12 injection IM left deltoid per pt request since he had multiple injections yesterday and his right deltoid is still sore. Pt tolerated injection well. ?

## 2021-04-17 ENCOUNTER — Ambulatory Visit (INDEPENDENT_AMBULATORY_CARE_PROVIDER_SITE_OTHER): Payer: Medicare HMO | Admitting: *Deleted

## 2021-04-17 DIAGNOSIS — E538 Deficiency of other specified B group vitamins: Secondary | ICD-10-CM

## 2021-04-17 NOTE — Progress Notes (Signed)
Vitamin b12 injection given and patient tolerated well.  

## 2021-04-23 ENCOUNTER — Ambulatory Visit: Payer: Medicare HMO | Admitting: Family

## 2021-04-23 ENCOUNTER — Ambulatory Visit: Payer: Medicare HMO | Admitting: Neurology

## 2021-04-24 ENCOUNTER — Ambulatory Visit (INDEPENDENT_AMBULATORY_CARE_PROVIDER_SITE_OTHER): Payer: Medicare HMO | Admitting: Family Medicine

## 2021-04-24 DIAGNOSIS — E538 Deficiency of other specified B group vitamins: Secondary | ICD-10-CM | POA: Diagnosis not present

## 2021-04-24 DIAGNOSIS — Z23 Encounter for immunization: Secondary | ICD-10-CM

## 2021-04-24 MED ORDER — CYANOCOBALAMIN 1000 MCG/ML IJ SOLN
1000.0000 ug | Freq: Once | INTRAMUSCULAR | Status: AC
  Administered 2021-04-24: 1000 ug via INTRAMUSCULAR

## 2021-05-01 ENCOUNTER — Ambulatory Visit (INDEPENDENT_AMBULATORY_CARE_PROVIDER_SITE_OTHER): Payer: Medicare HMO | Admitting: Emergency Medicine

## 2021-05-01 DIAGNOSIS — E538 Deficiency of other specified B group vitamins: Secondary | ICD-10-CM | POA: Diagnosis not present

## 2021-05-01 MED ORDER — CYANOCOBALAMIN 1000 MCG/ML IJ SOLN
1000.0000 ug | Freq: Once | INTRAMUSCULAR | Status: AC
  Administered 2021-05-01: 1000 ug via INTRAMUSCULAR

## 2021-05-08 ENCOUNTER — Ambulatory Visit (INDEPENDENT_AMBULATORY_CARE_PROVIDER_SITE_OTHER): Payer: Medicare HMO | Admitting: Emergency Medicine

## 2021-05-08 ENCOUNTER — Ambulatory Visit: Payer: Medicare HMO

## 2021-05-08 DIAGNOSIS — E538 Deficiency of other specified B group vitamins: Secondary | ICD-10-CM

## 2021-05-08 MED ORDER — CYANOCOBALAMIN 1000 MCG/ML IJ SOLN
1000.0000 ug | Freq: Once | INTRAMUSCULAR | Status: AC
  Administered 2021-05-08: 1000 ug via INTRAMUSCULAR

## 2021-05-09 ENCOUNTER — Encounter (HOSPITAL_COMMUNITY): Payer: Self-pay | Admitting: *Deleted

## 2021-05-09 ENCOUNTER — Emergency Department (HOSPITAL_COMMUNITY)
Admission: EM | Admit: 2021-05-09 | Discharge: 2021-05-09 | Disposition: A | Discharge status: Home / Self Care | Payer: Medicare HMO | Attending: Emergency Medicine | Admitting: Emergency Medicine

## 2021-05-09 ENCOUNTER — Emergency Department (HOSPITAL_COMMUNITY): Payer: Medicare HMO

## 2021-05-09 ENCOUNTER — Other Ambulatory Visit: Payer: Self-pay

## 2021-05-09 DIAGNOSIS — Z041 Encounter for examination and observation following transport accident: Secondary | ICD-10-CM | POA: Diagnosis not present

## 2021-05-09 DIAGNOSIS — M542 Cervicalgia: Secondary | ICD-10-CM | POA: Diagnosis not present

## 2021-05-09 DIAGNOSIS — S01111A Laceration without foreign body of right eyelid and periocular area, initial encounter: Secondary | ICD-10-CM | POA: Diagnosis not present

## 2021-05-09 DIAGNOSIS — S0181XA Laceration without foreign body of other part of head, initial encounter: Secondary | ICD-10-CM | POA: Insufficient documentation

## 2021-05-09 DIAGNOSIS — Z7982 Long term (current) use of aspirin: Secondary | ICD-10-CM | POA: Insufficient documentation

## 2021-05-09 DIAGNOSIS — S0993XA Unspecified injury of face, initial encounter: Secondary | ICD-10-CM | POA: Diagnosis not present

## 2021-05-09 DIAGNOSIS — Y92410 Unspecified street and highway as the place of occurrence of the external cause: Secondary | ICD-10-CM | POA: Diagnosis not present

## 2021-05-09 DIAGNOSIS — I10 Essential (primary) hypertension: Secondary | ICD-10-CM | POA: Insufficient documentation

## 2021-05-09 DIAGNOSIS — Z743 Need for continuous supervision: Secondary | ICD-10-CM | POA: Diagnosis not present

## 2021-05-09 DIAGNOSIS — G319 Degenerative disease of nervous system, unspecified: Secondary | ICD-10-CM | POA: Diagnosis not present

## 2021-05-09 DIAGNOSIS — S0101XA Laceration without foreign body of scalp, initial encounter: Secondary | ICD-10-CM | POA: Diagnosis not present

## 2021-05-09 DIAGNOSIS — Z79899 Other long term (current) drug therapy: Secondary | ICD-10-CM | POA: Insufficient documentation

## 2021-05-09 DIAGNOSIS — R58 Hemorrhage, not elsewhere classified: Secondary | ICD-10-CM | POA: Diagnosis not present

## 2021-05-09 DIAGNOSIS — I6523 Occlusion and stenosis of bilateral carotid arteries: Secondary | ICD-10-CM | POA: Diagnosis not present

## 2021-05-09 DIAGNOSIS — R9082 White matter disease, unspecified: Secondary | ICD-10-CM | POA: Diagnosis not present

## 2021-05-09 NOTE — Discharge Instructions (Signed)
Clean laceration gently twice a day with soap and water.  Get staples taken out in a week ?
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
## 2021-05-13 ENCOUNTER — Telehealth: Payer: Self-pay | Admitting: Family

## 2021-05-13 NOTE — Telephone Encounter (Signed)
Appointment given for Friday at 1:00pm.  ?

## 2021-05-15 ENCOUNTER — Ambulatory Visit: Payer: Medicare HMO | Admitting: Neurology

## 2021-05-15 ENCOUNTER — Ambulatory Visit: Payer: Medicare HMO

## 2021-05-17 ENCOUNTER — Ambulatory Visit (INDEPENDENT_AMBULATORY_CARE_PROVIDER_SITE_OTHER): Payer: Medicare HMO | Admitting: Family Medicine

## 2021-05-17 ENCOUNTER — Encounter: Payer: Self-pay | Admitting: Family Medicine

## 2021-05-17 DIAGNOSIS — S0181XD Laceration without foreign body of other part of head, subsequent encounter: Secondary | ICD-10-CM

## 2021-05-17 DIAGNOSIS — Z09 Encounter for follow-up examination after completed treatment for conditions other than malignant neoplasm: Secondary | ICD-10-CM

## 2021-05-17 DIAGNOSIS — E538 Deficiency of other specified B group vitamins: Secondary | ICD-10-CM | POA: Diagnosis not present

## 2021-05-17 DIAGNOSIS — S0101XA Laceration without foreign body of scalp, initial encounter: Secondary | ICD-10-CM

## 2021-05-17 DIAGNOSIS — S0101XD Laceration without foreign body of scalp, subsequent encounter: Secondary | ICD-10-CM

## 2021-05-17 DIAGNOSIS — S0181XA Laceration without foreign body of other part of head, initial encounter: Secondary | ICD-10-CM

## 2021-05-17 MED ORDER — CYANOCOBALAMIN 1000 MCG/ML IJ SOLN
1000.0000 ug | INTRAMUSCULAR | Status: AC
  Administered 2021-05-17 – 2021-12-12 (×7): 1000 ug via INTRAMUSCULAR

## 2021-05-17 NOTE — Patient Instructions (Signed)
Vitamin B12 Deficiency Vitamin B12 deficiency occurs when the body does not have enough of this important vitamin. The body needs this vitamin: To make red blood cells. To make DNA. This is the genetic material inside cells. To help the nerves work properly so they can carry messages from the brain to the body. Vitamin B12 deficiency can cause health problems, such as not having enough red blood cells in the blood (anemia). This can lead to nerve damage if untreated. What are the causes? This condition may be caused by: Not eating enough foods that contain vitamin B12. Not having enough stomach acid and digestive fluids to properly absorb vitamin B12 from the food that you eat. Having certain diseases that make it hard to absorb vitamin B12. These diseases include Crohn's disease, chronic pancreatitis, and cystic fibrosis. An autoimmune disorder in which the body does not make enough of a protein (intrinsic factor) within the stomach, resulting in not enough absorption of vitamin B12. Having a surgery in which part of the stomach or small intestine is removed. Taking certain medicines that make it hard for the body to absorb vitamin B12. These include: Heartburn medicines, such as antacids and proton pump inhibitors. Some medicines that are used to treat diabetes. What increases the risk? The following factors may make you more likely to develop a vitamin B12 deficiency: Being an older adult. Eating a vegetarian or vegan diet that does not include any foods that come from animals. Eating a poor diet while you are pregnant. Taking certain medicines. Having alcoholism. What are the signs or symptoms? In some cases, there are no symptoms of this condition. If the condition leads to anemia or nerve damage, various symptoms may occur, such as: Weakness. Tiredness (fatigue). Loss of appetite. Numbness or tingling in your hands and feet. Redness and burning of the tongue. Depression,  confusion, or memory problems. Trouble walking. If anemia is severe, symptoms can include: Shortness of breath. Dizziness. Rapid heart rate. How is this diagnosed? This condition may be diagnosed with a blood test to measure the level of vitamin B12 in your blood. You may also have other tests, including: A group of tests that measure certain characteristics of blood cells (complete blood count, CBC). A blood test to measure intrinsic factor. A procedure where a thin tube with a camera on the end is used to look into your stomach or intestines (endoscopy). Other tests may be needed to discover the cause of the deficiency. How is this treated? Treatment for this condition depends on the cause. This condition may be treated by: Changing your eating and drinking habits, such as: Eating more foods that contain vitamin B12. Drinking less alcohol or no alcohol. Getting vitamin B12 injections. Taking vitamin B12 supplements by mouth (orally). Your health care provider will tell you which dose is best for you. Follow these instructions at home: Eating and drinking  Include foods in your diet that come from animals and contain a lot of vitamin B12. These include: Meats and poultry. This includes beef, pork, chicken, turkey, and organ meats, such as liver. Seafood. This includes clams, rainbow trout, salmon, tuna, and haddock. Eggs. Dairy foods such as milk, yogurt, and cheese. Eat foods that have vitamin B12 added to them (are fortified), such as ready-to-eat breakfast cereals. Check the label on the package to see if a food is fortified. The items listed above may not be a complete list of foods and beverages you can eat and drink. Contact a dietitian for   more information. Alcohol use Do not drink alcohol if: Your health care provider tells you not to drink. You are pregnant, may be pregnant, or are planning to become pregnant. If you drink alcohol: Limit how much you have to: 0-1 drink a  day for women. 0-2 drinks a day for men. Know how much alcohol is in your drink. In the [place] one drink equals one 12 oz bottle of beer (355 mL), one 5 oz glass of wine (148 mL), or one 1 oz glass of hard liquor (44 mL). General instructions Get vitamin B12 injections if told to by your health care provider. Take supplements only as told by your health care provider. Follow the directions carefully. Keep all follow-up visits. This is important. Contact a health care provider if: Your symptoms come back. Your symptoms get worse or do not improve with treatment. Get help right away: You develop shortness of breath. You have a rapid heart rate. You have chest pain. You become dizzy or you faint. These symptoms may be an emergency. Get help right away. Call 911. Do not wait to see if the symptoms will go away. Do not drive yourself to the hospital. Summary Vitamin B12 deficiency occurs when the body does not have enough of this important vitamin. Common causes include not eating enough foods that contain vitamin B12, not being able to absorb vitamin B12 from the food that you eat, having a surgery in which part of the stomach or small intestine is removed, or taking certain medicines. Eat foods that have vitamin B12 in them. Treatment may include making a change in the way you eat and drink, getting vitamin B12 injections, or taking vitamin B12 supplements. This information is not intended to replace advice given to you by your health care provider. Make sure you discuss any questions you have with your health care provider. Document Revised: 08/24/2020 Document Reviewed: 08/24/2020 Elsevier Patient Education  2023 Elsevier Inc.  
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
## 2021-05-30 ENCOUNTER — Encounter: Payer: Self-pay | Admitting: Neurology

## 2021-05-30 ENCOUNTER — Ambulatory Visit: Payer: Medicare HMO | Admitting: Neurology

## 2021-05-30 ENCOUNTER — Telehealth: Payer: Self-pay | Admitting: Neurology

## 2021-05-30 VITALS — BP 163/90 | HR 82 | Ht 71.0 in | Wt 140.0 lb

## 2021-05-30 DIAGNOSIS — E538 Deficiency of other specified B group vitamins: Secondary | ICD-10-CM

## 2021-05-30 DIAGNOSIS — R404 Transient alteration of awareness: Secondary | ICD-10-CM | POA: Diagnosis not present
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
## 2021-05-30 NOTE — Patient Instructions (Signed)
Continue current medications We will obtain a routine EEG, I will contact you to go over the result Follow-up as needed

## 2021-05-30 NOTE — Telephone Encounter (Signed)
Pt is asking for EEG to be scheduled at [facility] in [place].

## 2021-06-11 NOTE — Telephone Encounter (Signed)
Patient's daughter LVM asking for info about scheduling EEG at [facility]

## 2021-06-13 ENCOUNTER — Other Ambulatory Visit: Payer: Medicare HMO | Admitting: *Deleted

## 2021-06-18 NOTE — Telephone Encounter (Signed)
Called pt's daughter back and scheduled EEG, she was appreciative for the call.

## 2021-06-18 NOTE — Telephone Encounter (Signed)
We have called [facility] multiple times and no one there is able to figure out how we can get this patient scheduled there for an EEG. The patient will need to come here to get it done. Daughter left another voice mail today. Per Hedwig Morton, all EEGs should be done in our office.

## 2021-06-19 ENCOUNTER — Ambulatory Visit (INDEPENDENT_AMBULATORY_CARE_PROVIDER_SITE_OTHER): Payer: Medicare HMO | Admitting: Emergency Medicine

## 2021-06-19 DIAGNOSIS — E538 Deficiency of other specified B group vitamins: Secondary | ICD-10-CM

## 2021-06-26 ENCOUNTER — Other Ambulatory Visit: Payer: Self-pay | Admitting: Family

## 2021-06-27 ENCOUNTER — Other Ambulatory Visit: Payer: Self-pay | Admitting: Family

## 2021-06-27 DIAGNOSIS — I700 Atherosclerosis of aorta: Secondary | ICD-10-CM

## 2021-06-27 DIAGNOSIS — E7800 Pure hypercholesterolemia, unspecified: Secondary | ICD-10-CM

## 2021-07-04 ENCOUNTER — Other Ambulatory Visit: Payer: Medicare HMO | Admitting: *Deleted

## 2021-07-08 ENCOUNTER — Ambulatory Visit: Payer: Medicare HMO | Admitting: *Deleted

## 2021-07-08 DIAGNOSIS — I10 Essential (primary) hypertension: Secondary | ICD-10-CM

## 2021-07-17 ENCOUNTER — Ambulatory Visit (INDEPENDENT_AMBULATORY_CARE_PROVIDER_SITE_OTHER): Payer: Medicare HMO

## 2021-07-17 DIAGNOSIS — E538 Deficiency of other specified B group vitamins: Secondary | ICD-10-CM

## 2021-07-17 NOTE — Progress Notes (Signed)
Cyanocobalamin injection given to left deltoid.  Patient tolerated well. 

## 2021-07-19 ENCOUNTER — Ambulatory Visit: Payer: Medicare HMO

## 2021-07-25 ENCOUNTER — Ambulatory Visit (INDEPENDENT_AMBULATORY_CARE_PROVIDER_SITE_OTHER): Payer: Medicare HMO | Admitting: Family

## 2021-07-25 ENCOUNTER — Encounter: Payer: Self-pay | Admitting: Family

## 2021-07-25 VITALS — BP 154/82 | HR 89 | Temp 97.2°F | Ht 71.0 in | Wt 135.8 lb

## 2021-07-25 DIAGNOSIS — N1831 Chronic kidney disease, stage 3a: Secondary | ICD-10-CM

## 2021-07-25 DIAGNOSIS — E7800 Pure hypercholesterolemia, unspecified: Secondary | ICD-10-CM

## 2021-07-25 DIAGNOSIS — I700 Atherosclerosis of aorta: Secondary | ICD-10-CM | POA: Diagnosis not present

## 2021-07-25 DIAGNOSIS — D508 Other iron deficiency anemias: Secondary | ICD-10-CM | POA: Diagnosis not present

## 2021-07-25 DIAGNOSIS — M5137 Other intervertebral disc degeneration, lumbosacral region: Secondary | ICD-10-CM | POA: Diagnosis not present

## 2021-07-25 DIAGNOSIS — E559 Vitamin D deficiency, unspecified: Secondary | ICD-10-CM

## 2021-07-25 DIAGNOSIS — Z8546 Personal history of malignant neoplasm of prostate: Secondary | ICD-10-CM | POA: Diagnosis not present

## 2021-07-25 DIAGNOSIS — I10 Essential (primary) hypertension: Secondary | ICD-10-CM

## 2021-07-25 DIAGNOSIS — Z23 Encounter for immunization: Secondary | ICD-10-CM

## 2021-07-25 DIAGNOSIS — M51379 Other intervertebral disc degeneration, lumbosacral region without mention of lumbar back pain or lower extremity pain: Secondary | ICD-10-CM

## 2021-07-25 DIAGNOSIS — E538 Deficiency of other specified B group vitamins: Secondary | ICD-10-CM

## 2021-07-25 NOTE — Addendum Note (Signed)
Addended by: Everlean Cherry on: 07/25/2021 03:48 PM   Modules accepted: Orders

## 2021-07-25 NOTE — Patient Instructions (Signed)
Health Maintenance After Age 86 After age 86, you are at a higher risk for certain long-term diseases and infections as well as injuries from falls. Falls are a major cause of broken bones and head injuries in people who are older than age 86. Getting regular preventive care can help to keep you healthy and well. Preventive care includes getting regular testing and making lifestyle changes as recommended by your health care provider. Talk with your health care provider about: Which screenings and tests you should have. A screening is a test that checks for a disease when you have no symptoms. A diet and exercise plan that is right for you. What should I know about screenings and tests to prevent falls? Screening and testing are the best ways to find a health problem early. Early diagnosis and treatment give you the best chance of managing medical conditions that are common after age 86. Certain conditions and lifestyle choices may make you more likely to have a fall. Your health care provider may recommend: Regular vision checks. Poor vision and conditions such as cataracts can make you more likely to have a fall. If you wear glasses, make sure to get your prescription updated if your vision changes. Medicine review. Work with your health care provider to regularly review all of the medicines you are taking, including over-the-counter medicines. Ask your health care provider about any side effects that may make you more likely to have a fall. Tell your health care provider if any medicines that you take make you feel dizzy or sleepy. Strength and balance checks. Your health care provider may recommend certain tests to check your strength and balance while standing, walking, or changing positions. Foot health exam. Foot pain and numbness, as well as not wearing proper footwear, can make you more likely to have a fall. Screenings, including: Osteoporosis screening. Osteoporosis is a condition that causes  the bones to get weaker and break more easily. Blood pressure screening. Blood pressure changes and medicines to control blood pressure can make you feel dizzy. Depression screening. You may be more likely to have a fall if you have a fear of falling, feel depressed, or feel unable to do activities that you used to do. Alcohol use screening. Using too much alcohol can affect your balance and may make you more likely to have a fall. Follow these instructions at home: Lifestyle Do not drink alcohol if: Your health care provider tells you not to drink. If you drink alcohol: Limit how much you have to: 0-1 drink a day for women. 0-2 drinks a day for men. Know how much alcohol is in your drink. In the [place] one drink equals one 12 oz bottle of beer (355 mL), one 5 oz glass of wine (148 mL), or one 1 oz glass of hard liquor (44 mL). Do not use any products that contain nicotine or tobacco. These products include cigarettes, chewing tobacco, and vaping devices, such as e-cigarettes. If you need help quitting, ask your health care provider. Activity  Follow a regular exercise program to stay fit. This will help you maintain your balance. Ask your health care provider what types of exercise are appropriate for you. If you need a cane or walker, use it as recommended by your health care provider. Wear supportive shoes that have nonskid soles. Safety  Remove any tripping hazards, such as rugs, cords, and clutter. Install safety equipment such as grab bars in bathrooms and safety rails on stairs. Keep rooms and walkways   well-lit. General instructions Talk with your health care provider about your risks for falling. Tell your health care provider if: You fall. Be sure to tell your health care provider about all falls, even ones that seem minor. You feel dizzy, tiredness (fatigue), or off-balance. Take over-the-counter and prescription medicines only as told by your health care provider. These include  supplements. Eat a healthy diet and maintain a healthy weight. A healthy diet includes low-fat dairy products, low-fat (lean) meats, and fiber from whole grains, beans, and lots of fruits and vegetables. Stay current with your vaccines. Schedule regular health, dental, and eye exams. Summary Having a healthy lifestyle and getting preventive care can help to protect your health and wellness after age 86. Screening and testing are the best way to find a health problem early and help you avoid having a fall. Early diagnosis and treatment give you the best chance for managing medical conditions that are more common for people who are older than age 86. Falls are a major cause of broken bones and head injuries in people who are older than age 86. Take precautions to prevent a fall at home. Work with your health care provider to learn what changes you can make to improve your health and wellness and to prevent falls. This information is not intended to replace advice given to you by your health care provider. Make sure you discuss any questions you have with your health care provider. Document Revised: 05/21/2020 Document Reviewed: 05/21/2020 Elsevier Patient Education  2023 [employer]  

## 2021-07-25 NOTE — Progress Notes (Signed)
Subjective:    [name], male    DOB: May 02, 1928, 88 y.o.   MRN: [id number]  Chief Complaint  Patient presents with   Hypertension   Knee Pain   Pt presents to the office today for chronic follow up. He has CKD and limits his NSAID's. He has aortic atherosclerosis and takes Crestor daily.    He states his BP is always elevated when he comes to the office, but at this morning at home it was 140/81.Marland Kitchen   Hypertension This is a chronic problem. The current episode started more than 1 year ago. The problem has been waxing and waning since onset. The problem is uncontrolled. Associated symptoms include peripheral edema (Slight ankle swelling). Pertinent negatives include no malaise/fatigue or shortness of breath. Risk factors for coronary artery disease include dyslipidemia, male gender and sedentary lifestyle. The current treatment provides moderate improvement. Hypertensive end-organ damage includes kidney disease. There is no history of heart failure.  Back Pain This is a chronic problem. The problem occurs intermittently. The problem has been waxing and waning since onset. The pain is present in the lumbar spine. The quality of the pain is described as aching. The pain is at a severity of 6/10. The pain is mild. The symptoms are aggravated by standing. Pertinent negatives include no tingling or weakness.  Hyperlipidemia This is a chronic problem. The current episode started more than 1 year ago. The problem is controlled. Recent lipid tests were reviewed and are normal. Pertinent negatives include no shortness of breath. Current antihyperlipidemic treatment includes statins. The current treatment provides moderate improvement of lipids. Risk factors for coronary artery disease include dyslipidemia, male sex, hypertension and a sedentary lifestyle.  Anemia Presents for follow-up visit. There has been no malaise/fatigue. There is no history of heart failure.      Review  of Systems  Constitutional:  Negative for malaise/fatigue.  Respiratory:  Negative for shortness of breath.   Musculoskeletal:  Positive for back pain.  Neurological:  Negative for tingling and weakness.  All other systems reviewed and are negative.      Objective:   Physical Exam Vitals reviewed.  Constitutional:      General: He is not in acute distress.    Appearance: He is well-developed.  HENT:     Head: Normocephalic.     Right Ear: Tympanic membrane normal.     Left Ear: Tympanic membrane normal.  Eyes:     General:        Right eye: No discharge.        Left eye: No discharge.     Pupils: Pupils are equal, round, and reactive to light.  Neck:     Thyroid: No thyromegaly.  Cardiovascular:     Rate and Rhythm: Normal rate and regular rhythm.     Heart sounds: Normal heart sounds. No murmur heard. Pulmonary:     Effort: Pulmonary effort is normal. No respiratory distress.     Breath sounds: Normal breath sounds. No wheezing.  Abdominal:     General: Bowel sounds are normal. There is no distension.     Palpations: Abdomen is soft.     Tenderness: There is no abdominal tenderness.  Musculoskeletal:        General: No tenderness. Normal range of motion.     Cervical back: Normal range of motion and neck supple.  Skin:    General: Skin is warm and dry.     Findings: No erythema or rash.  Neurological:     Mental Status: He is alert and oriented to person, place, and time.     Cranial Nerves: No cranial nerve deficit.     Deep Tendon Reflexes: Reflexes are normal and symmetric.  Psychiatric:        Behavior: Behavior normal.        Thought Content: Thought content normal.        Judgment: Judgment normal.   Bilateral ears washed with warm water. Pt tolerated well. TM WNL.    BP (!) 154/82   Pulse 89   Temp (!) 97.2 F (36.2 C)   Ht _0  (1.803 m)   Wt 135 lb 12.8 oz (61.6 kg)   SpO2 96%   BMI 18.94 kg/m      Assessment & Plan:  Benjamin Buchanan  comes in today with chief complaint of Hypertension and Knee Pain   Diagnosis and orders addressed:  1. Essential hypertension - CMP14+EGFR - CBC with Differential/Platelet  2. Thoracic aortic atherosclerosis (HCC) - CMP14+EGFR - CBC with Differential/Platelet  3. Degeneration of lumbosacral intervertebral disc - CMP14+EGFR - CBC with Differential/Platelet  4. Stage 3a chronic kidney disease (HCC) - CMP14+EGFR - CBC with Differential/Platelet  5. Pure hypercholesterolemia - CMP14+EGFR - CBC with Differential/Platelet  6. Other iron deficiency anemia - CMP14+EGFR - CBC with Differential/Platelet  7. Vitamin D deficiency - CMP14+EGFR - CBC with Differential/Platelet  8. History of prostate cancer - CMP14+EGFR - CBC with Differential/Platelet  9. Vitamin B 12 deficiency - CMP14+EGFR - CBC with Differential/Platelet - Vitamin B12   Labs pending Health Maintenance reviewed Diet and exercise encouraged  Follow up plan: 6 months    Benjamin Dun, FNP

## 2021-08-01 ENCOUNTER — Ambulatory Visit: Payer: Medicare HMO | Admitting: *Deleted

## 2021-08-14 ENCOUNTER — Ambulatory Visit: Payer: Medicare HMO

## 2021-08-22 ENCOUNTER — Ambulatory Visit: Payer: Medicare HMO | Admitting: Neurology

## 2021-08-22 ENCOUNTER — Telehealth: Payer: Self-pay

## 2021-08-22 DIAGNOSIS — R4182 Altered mental status, unspecified: Secondary | ICD-10-CM | POA: Diagnosis not present

## 2021-08-22 DIAGNOSIS — R404 Transient alteration of awareness: Secondary | ICD-10-CM

## 2021-08-22 NOTE — Telephone Encounter (Signed)
-----   Message from Alric Ran, MD sent at 08/22/2021  3:50 PM EDT ----- Please call and inform patient that his recent EEG (Brain wave test) was normal. In particular, there were no epileptiform discharges and no seizures. No further action is required on this test at this time. Please keep any upcoming appointments or tests and  call us with any interim questions, concerns, problems or updates. Thanks,   Alric Ran, MD

## 2021-08-22 NOTE — Progress Notes (Signed)
Please call and inform patient that his recent EEG (Brain wave test) was normal. In particular, there were no epileptiform discharges and no seizures. No further action is required on this test at this time. Please keep any upcoming appointments or tests and  call us with any interim questions, concerns, problems or updates. Thanks,   Landy Dunnavant, MD 

## 2021-08-22 NOTE — Procedures (Signed)
    History:  88 year old man with seizure vs. Syncope   EEG classification: Awake and drowsy  Description of the recording: The background rhythms of this recording consists of a fairly well modulated medium amplitude alpha rhythm of 8.5 Hz that is reactive to eye opening and closure. As the record progresses, the patient appears to remain in the waking state throughout the recording. Photic stimulation was performed, did not show any abnormalities. Hyperventilation was not performed. Toward the end of the recording, the patient enters the drowsy state with slight symmetric slowing seen. The patient never enters stage II sleep. No abnormal epileptiform discharges seen during this recording. There was no focal slowing. EKG monitor shows no evidence of cardiac rhythm abnormalities with a heart rate of 72.  Abnormality: None   Impression: This is a normal EEG recording in the waking and drowsy state. No evidence of interictal epileptiform discharges seen. A normal EEG does not exclude a diagnosis of epilepsy.    Alric Ran, MD [facility]

## 2021-08-22 NOTE — Telephone Encounter (Signed)
I called pt's daughter Sula Soda) ok per dpr and advised of EEG. She verbalized understanding and appreciated the call.  Per office visit plan from may. Pt will follow up with our clinic as needed.

## 2021-09-05 ENCOUNTER — Other Ambulatory Visit: Payer: Self-pay | Admitting: Family

## 2021-09-05 DIAGNOSIS — I10 Essential (primary) hypertension: Secondary | ICD-10-CM

## 2021-09-11 ENCOUNTER — Ambulatory Visit (INDEPENDENT_AMBULATORY_CARE_PROVIDER_SITE_OTHER): Payer: Medicare HMO | Admitting: *Deleted

## 2021-09-11 DIAGNOSIS — E538 Deficiency of other specified B group vitamins: Secondary | ICD-10-CM

## 2021-09-12 ENCOUNTER — Other Ambulatory Visit: Payer: Self-pay | Admitting: Family

## 2021-09-12 DIAGNOSIS — I700 Atherosclerosis of aorta: Secondary | ICD-10-CM

## 2021-09-12 DIAGNOSIS — E7800 Pure hypercholesterolemia, unspecified: Secondary | ICD-10-CM

## 2021-09-14 ENCOUNTER — Other Ambulatory Visit: Payer: Self-pay | Admitting: Family

## 2021-09-14 DIAGNOSIS — I10 Essential (primary) hypertension: Secondary | ICD-10-CM

## 2021-09-20 ENCOUNTER — Other Ambulatory Visit: Payer: Self-pay | Admitting: Family

## 2021-10-11 ENCOUNTER — Ambulatory Visit (INDEPENDENT_AMBULATORY_CARE_PROVIDER_SITE_OTHER): Payer: Medicare HMO | Admitting: *Deleted

## 2021-10-11 DIAGNOSIS — Z23 Encounter for immunization: Secondary | ICD-10-CM | POA: Diagnosis not present

## 2021-10-11 DIAGNOSIS — E538 Deficiency of other specified B group vitamins: Secondary | ICD-10-CM | POA: Diagnosis not present

## 2021-10-11 NOTE — Progress Notes (Signed)
Pt in today for B12 & flu vaccine. Pt tolerated well

## 2021-11-11 ENCOUNTER — Ambulatory Visit (INDEPENDENT_AMBULATORY_CARE_PROVIDER_SITE_OTHER): Payer: Medicare HMO

## 2021-11-11 DIAGNOSIS — E538 Deficiency of other specified B group vitamins: Secondary | ICD-10-CM | POA: Diagnosis not present

## 2021-11-11 NOTE — Progress Notes (Signed)
Cyanocobalamin injection given to right deltoid.  Patient tolerated well. 

## 2021-11-25 ENCOUNTER — Encounter: Payer: Self-pay | Admitting: Family

## 2021-11-25 ENCOUNTER — Ambulatory Visit (INDEPENDENT_AMBULATORY_CARE_PROVIDER_SITE_OTHER): Payer: Medicare HMO | Admitting: Family

## 2021-11-25 VITALS — BP 159/82 | HR 92 | Temp 98.0°F | Ht 71.0 in | Wt 133.4 lb

## 2021-11-25 DIAGNOSIS — E559 Vitamin D deficiency, unspecified: Secondary | ICD-10-CM

## 2021-11-25 DIAGNOSIS — D508 Other iron deficiency anemias: Secondary | ICD-10-CM | POA: Diagnosis not present

## 2021-11-25 DIAGNOSIS — I700 Atherosclerosis of aorta: Secondary | ICD-10-CM | POA: Diagnosis not present

## 2021-11-25 DIAGNOSIS — N1831 Chronic kidney disease, stage 3a: Secondary | ICD-10-CM

## 2021-11-25 DIAGNOSIS — I10 Essential (primary) hypertension: Secondary | ICD-10-CM

## 2021-11-25 DIAGNOSIS — M48061 Spinal stenosis, lumbar region without neurogenic claudication: Secondary | ICD-10-CM

## 2021-11-25 DIAGNOSIS — E7800 Pure hypercholesterolemia, unspecified: Secondary | ICD-10-CM

## 2021-11-25 NOTE — Patient Instructions (Signed)
Health Maintenance After Age 86 After age 86, you are at a higher risk for certain long-term diseases and infections as well as injuries from falls. Falls are a major cause of broken bones and head injuries in people who are older than age 86. Getting regular preventive care can help to keep you healthy and well. Preventive care includes getting regular testing and making lifestyle changes as recommended by your health care provider. Talk with your health care provider about: Which screenings and tests you should have. A screening is a test that checks for a disease when you have no symptoms. A diet and exercise plan that is right for you. What should I know about screenings and tests to prevent falls? Screening and testing are the best ways to find a health problem early. Early diagnosis and treatment give you the best chance of managing medical conditions that are common after age 86. Certain conditions and lifestyle choices may make you more likely to have a fall. Your health care provider may recommend: Regular vision checks. Poor vision and conditions such as cataracts can make you more likely to have a fall. If you wear glasses, make sure to get your prescription updated if your vision changes. Medicine review. Work with your health care provider to regularly review all of the medicines you are taking, including over-the-counter medicines. Ask your health care provider about any side effects that may make you more likely to have a fall. Tell your health care provider if any medicines that you take make you feel dizzy or sleepy. Strength and balance checks. Your health care provider may recommend certain tests to check your strength and balance while standing, walking, or changing positions. Foot health exam. Foot pain and numbness, as well as not wearing proper footwear, can make you more likely to have a fall. Screenings, including: Osteoporosis screening. Osteoporosis is a condition that causes  the bones to get weaker and break more easily. Blood pressure screening. Blood pressure changes and medicines to control blood pressure can make you feel dizzy. Depression screening. You may be more likely to have a fall if you have a fear of falling, feel depressed, or feel unable to do activities that you used to do. Alcohol use screening. Using too much alcohol can affect your balance and may make you more likely to have a fall. Follow these instructions at home: Lifestyle Do not drink alcohol if: Your health care provider tells you not to drink. If you drink alcohol: Limit how much you have to: 0-1 drink a day for women. 0-2 drinks a day for men. Know how much alcohol is in your drink. In the [place] one drink equals one 12 oz bottle of beer (355 mL), one 5 oz glass of wine (148 mL), or one 1 oz glass of hard liquor (44 mL). Do not use any products that contain nicotine or tobacco. These products include cigarettes, chewing tobacco, and vaping devices, such as e-cigarettes. If you need help quitting, ask your health care provider. Activity  Follow a regular exercise program to stay fit. This will help you maintain your balance. Ask your health care provider what types of exercise are appropriate for you. If you need a cane or walker, use it as recommended by your health care provider. Wear supportive shoes that have nonskid soles. Safety  Remove any tripping hazards, such as rugs, cords, and clutter. Install safety equipment such as grab bars in bathrooms and safety rails on stairs. Keep rooms and walkways   well-lit. General instructions Talk with your health care provider about your risks for falling. Tell your health care provider if: You fall. Be sure to tell your health care provider about all falls, even ones that seem minor. You feel dizzy, tiredness (fatigue), or off-balance. Take over-the-counter and prescription medicines only as told by your health care provider. These include  supplements. Eat a healthy diet and maintain a healthy weight. A healthy diet includes low-fat dairy products, low-fat (lean) meats, and fiber from whole grains, beans, and lots of fruits and vegetables. Stay current with your vaccines. Schedule regular health, dental, and eye exams. Summary Having a healthy lifestyle and getting preventive care can help to protect your health and wellness after age 86. Screening and testing are the best way to find a health problem early and help you avoid having a fall. Early diagnosis and treatment give you the best chance for managing medical conditions that are more common for people who are older than age 86. Falls are a major cause of broken bones and head injuries in people who are older than age 86. Take precautions to prevent a fall at home. Work with your health care provider to learn what changes you can make to improve your health and wellness and to prevent falls. This information is not intended to replace advice given to you by your health care provider. Make sure you discuss any questions you have with your health care provider. Document Revised: 05/21/2020 Document Reviewed: 05/21/2020 Elsevier Patient Education  2023 [employer]  

## 2021-11-25 NOTE — Progress Notes (Signed)
Subjective:    [name], male    DOB: 1928/09/23, 88 y.o.   MRN: [id number]  Chief Complaint  Patient presents with   Medical Management of Chronic Issues    No concerns, fasting    Pt presents to the office today for chronic follow up. He has CKD and limits his NSAID's. He has aortic atherosclerosis and takes Crestor daily.    He states his BP is always elevated when he comes to the office, but at this morning at home it was 140/81. Hypertension This is a chronic problem. The current episode started more than 1 year ago. The problem has been waxing and waning since onset. The problem is uncontrolled. Pertinent negatives include no malaise/fatigue, peripheral edema or shortness of breath. Risk factors for coronary artery disease include dyslipidemia, obesity and male gender. The current treatment provides moderate improvement.  Hyperlipidemia This is a chronic problem. The current episode started more than 1 year ago. The problem is controlled. Recent lipid tests were reviewed and are normal. Pertinent negatives include no shortness of breath. Current antihyperlipidemic treatment includes statins. The current treatment provides moderate improvement of lipids. Risk factors for coronary artery disease include dyslipidemia, hypertension and a sedentary lifestyle.  Anemia Presents for follow-up visit. There has been no bruising/bleeding easily or malaise/fatigue.  Back Pain This is a chronic problem. The current episode started more than 1 year ago. The problem occurs intermittently. The problem has been waxing and waning since onset. The pain is present in the lumbar spine. The quality of the pain is described as aching. The pain is at a severity of 2/10. The pain is mild. Treatments tried: tylenol.      Review of Systems  Constitutional:  Negative for malaise/fatigue.  Respiratory:  Negative for shortness of breath.   Musculoskeletal:  Positive for back pain.   Hematological:  Does not bruise/bleed easily.  All other systems reviewed and are negative.      Objective:   Physical Exam Vitals reviewed.  Constitutional:      General: He is not in acute distress.    Appearance: He is well-developed.  HENT:     Head: Normocephalic.     Right Ear: Tympanic membrane normal.     Left Ear: Tympanic membrane normal.  Eyes:     General:        Right eye: No discharge.        Left eye: No discharge.     Pupils: Pupils are equal, round, and reactive to light.  Neck:     Thyroid: No thyromegaly.  Cardiovascular:     Rate and Rhythm: Normal rate and regular rhythm.     Heart sounds: Normal heart sounds. No murmur heard. Pulmonary:     Effort: Pulmonary effort is normal. No respiratory distress.     Breath sounds: Normal breath sounds. No wheezing.  Abdominal:     General: Bowel sounds are normal. There is no distension.     Palpations: Abdomen is soft.     Tenderness: There is no abdominal tenderness.  Musculoskeletal:        General: No tenderness. Normal range of motion.     Cervical back: Normal range of motion and neck supple.  Skin:    General: Skin is warm and dry.     Findings: No erythema or rash.  Neurological:     Mental Status: He is alert and oriented to person, place, and time.     Cranial  Nerves: No cranial nerve deficit.     Motor: Weakness present.     Deep Tendon Reflexes: Reflexes are normal and symmetric.  Psychiatric:        Behavior: Behavior normal.        Thought Content: Thought content normal.        Judgment: Judgment normal.       BP (!) 172/77   Pulse 92   Temp 98 F (36.7 C) (Temporal)   Ht _0  (1.803 m)   Wt 133 lb 6.4 oz (60.5 kg)   BMI 18.61 kg/m      Assessment & Plan:  Benjamin Buchanan comes in today with chief complaint of Medical Management of Chronic Issues (No concerns, fasting )   Diagnosis and orders addressed:  1. Essential hypertension - CMP14+EGFR - CBC with  Differential/Platelet  2. Pure hypercholesterolemia - CMP14+EGFR - CBC with Differential/Platelet  3. Thoracic aortic atherosclerosis (HCC) - CMP14+EGFR - CBC with Differential/Platelet  4. Vitamin D deficiency - CMP14+EGFR - CBC with Differential/Platelet  5. Stage 3a chronic kidney disease (HCC) - CMP14+EGFR - CBC with Differential/Platelet  6. Other iron deficiency anemia - CMP14+EGFR - CBC with Differential/Platelet - Vitamin B12  7. Spinal stenosis of lumbar region at multiple levels - CMP14+EGFR - CBC with Differential/Platelet   Labs pending Health Maintenance reviewed Diet and exercise encouraged  Follow up plan: 6 months    Evelina Dun, FNP

## 2021-11-26 LAB — VITAMIN B12: Vitamin B-12: 726 pg/mL (ref 232–1245)

## 2021-11-26 LAB — CMP14+EGFR
ALT: 10 IU/L (ref 0–44)
AST: 16 IU/L (ref 0–40)
Albumin/Globulin Ratio: 2 (ref 1.2–2.2)
Albumin: 4.3 g/dL (ref 3.6–4.6)
Alkaline Phosphatase: 79 IU/L (ref 44–121)
BUN/Creatinine Ratio: 13 (ref 10–24)
BUN: 14 mg/dL (ref 10–36)
Bilirubin Total: 0.5 mg/dL (ref 0.0–1.2)
CO2: 22 mmol/L (ref 20–29)
Calcium: 9.6 mg/dL (ref 8.6–10.2)
Chloride: 106 mmol/L (ref 96–106)
Creatinine, Ser: 1.12 mg/dL (ref 0.76–1.27)
Globulin, Total: 2.1 g/dL (ref 1.5–4.5)
Glucose: 107 mg/dL — ABNORMAL HIGH (ref 70–99)
Potassium: 4.3 mmol/L (ref 3.5–5.2)
Sodium: 144 mmol/L (ref 134–144)
Total Protein: 6.4 g/dL (ref 6.0–8.5)
eGFR: 62 mL/min/{1.73_m2} (ref >59)

## 2021-11-26 LAB — CBC WITH DIFFERENTIAL/PLATELET
Basophils Absolute: 0.1 10*3/uL (ref 0.0–0.2)
Basos: 1 %
EOS (ABSOLUTE): 0.1 10*3/uL (ref 0.0–0.4)
Eos: 2 %
Hematocrit: 36.9 % — ABNORMAL LOW (ref 37.5–51.0)
Hemoglobin: 11.7 g/dL — ABNORMAL LOW (ref 13.0–17.7)
Immature Grans (Abs): 0 10*3/uL (ref 0.0–0.1)
Immature Granulocytes: 0 %
Lymphocytes Absolute: 1.1 10*3/uL (ref 0.7–3.1)
Lymphs: 13 %
MCH: 30 pg (ref 26.6–33.0)
MCHC: 31.7 g/dL (ref 31.5–35.7)
MCV: 95 fL (ref 79–97)
Monocytes Absolute: 0.5 10*3/uL (ref 0.1–0.9)
Monocytes: 6 %
Neutrophils Absolute: 6.4 10*3/uL (ref 1.4–7.0)
Neutrophils: 78 %
Platelets: 319 10*3/uL (ref 150–450)
RBC: 3.9 x10E6/uL — ABNORMAL LOW (ref 4.14–5.80)
RDW: 13.3 % (ref 11.6–15.4)
WBC: 8.2 10*3/uL (ref 3.4–10.8)

## 2021-11-30 ENCOUNTER — Other Ambulatory Visit: Payer: Self-pay | Admitting: Family

## 2021-11-30 DIAGNOSIS — E7800 Pure hypercholesterolemia, unspecified: Secondary | ICD-10-CM

## 2021-11-30 DIAGNOSIS — I700 Atherosclerosis of aorta: Secondary | ICD-10-CM

## 2021-12-12 ENCOUNTER — Ambulatory Visit (INDEPENDENT_AMBULATORY_CARE_PROVIDER_SITE_OTHER): Payer: Medicare HMO | Admitting: *Deleted

## 2021-12-12 DIAGNOSIS — E538 Deficiency of other specified B group vitamins: Secondary | ICD-10-CM | POA: Diagnosis not present

## 2022-01-14 ENCOUNTER — Ambulatory Visit: Payer: Medicare HMO

## 2022-01-27 ENCOUNTER — Ambulatory Visit (INDEPENDENT_AMBULATORY_CARE_PROVIDER_SITE_OTHER): Payer: Medicare HMO

## 2022-01-27 VITALS — Ht 71.0 in | Wt 135.0 lb

## 2022-01-27 DIAGNOSIS — Z0000 Encounter for general adult medical examination without abnormal findings: Secondary | ICD-10-CM | POA: Diagnosis not present

## 2022-01-27 NOTE — Patient Instructions (Signed)
[name] , Thank you for taking time to come for your Medicare Wellness Visit. I appreciate your ongoing commitment to your health goals. Please review the following plan we discussed and let me know if I can assist you in the future.   These are the goals we discussed:  Goals      DIET - INCREASE WATER INTAKE     Try to drink 6-8 glasses of water daily     Exercise 3x per week (30 min per time)     12/05/2019 AWV Goal: Exercise for General Health  Patient will verbalize understanding of the benefits of increased physical activity: Exercising regularly is important. It will improve your overall fitness, flexibility, and endurance. Regular exercise also will improve your overall health. It can help you control your weight, reduce stress, and improve your bone density. Over the next year, patient will increase physical activity as tolerated with a goal of at least 150 minutes of moderate physical activity per week.  You can tell that you are exercising at a moderate intensity if your heart starts beating faster and you start breathing faster but can still hold a conversation. Moderate-intensity exercise ideas include: Walking 1 mile (1.6 km) in about 15 minutes Biking Hiking Golfing Dancing Water aerobics Patient will verbalize understanding of everyday activities that increase physical activity by providing examples like the following: Yard work, such as: Sales promotion account executive Gardening Washing windows or floors Patient will be able to explain general safety guidelines for exercising:  Before you start a new exercise program, talk with your health care provider. Do not exercise so much that you hurt yourself, feel dizzy, or get very short of breath. Wear comfortable clothes and wear shoes with good support. Drink plenty of water while you exercise to prevent dehydration or heat stroke. Work out until  your breathing and your heartbeat get faster.         This is a list of the screening recommended for you and due dates:  Health Maintenance  Topic Date Due   COVID-19 Vaccine (5 - 2023-24 season) 09/13/2021   Medicare Annual Wellness Visit  01/28/2023   DTaP/Tdap/Td vaccine (3 - Td or Tdap) 04/25/2031   Pneumonia Vaccine  Completed   Flu Shot  Completed   Zoster (Shingles) Vaccine  Completed   HPV Vaccine  Aged Out   Colon Cancer Screening  Discontinued    Advanced directives: Forms are available if you choose in the future to pursue completion.  This is recommended in order to make sure that your health wishes are honored in the event that you are unable to verbalize them to the provider.    Conditions/risks identified: Aim for 30 minutes of exercise or brisk walking, 6-8 glasses of water, and 5 servings of fruits and vegetables each day.   Next appointment: Follow up in one year for your annual wellness visit.   Preventive Care 70 Years and Older, Male  Preventive care refers to lifestyle choices and visits with your health care provider that can promote health and wellness. What does preventive care include? A yearly physical exam. This is also called an annual well check. Dental exams once or twice a year. Routine eye exams. Ask your health care provider how often you should have your eyes checked. Personal lifestyle choices, including: Daily care of your teeth and gums. Regular physical activity. Eating a healthy diet. Avoiding tobacco and drug  use. Limiting alcohol use. Practicing safe sex. Taking low doses of aspirin every day. Taking vitamin and mineral supplements as recommended by your health care provider. What happens during an annual well check? The services and screenings done by your health care provider during your annual well check will depend on your age, overall health, lifestyle risk factors, and family history of disease. Counseling  Your health care  provider may ask you questions about your: Alcohol use. Tobacco use. Drug use. Emotional well-being. Home and relationship well-being. Sexual activity. Eating habits. History of falls. Memory and ability to understand (cognition). Work and work Statistician. Screening  You may have the following tests or measurements: Height, weight, and BMI. Blood pressure. Lipid and cholesterol levels. These may be checked every 5 years, or more frequently if you are over 22 years old. Skin check. Lung cancer screening. You may have this screening every year starting at age 101 if you have a 30-pack-year history of smoking and currently smoke or have quit within the past 15 years. Fecal occult blood test (FOBT) of the stool. You may have this test every year starting at age 87. Flexible sigmoidoscopy or colonoscopy. You may have a sigmoidoscopy every 5 years or a colonoscopy every 10 years starting at age 102. Prostate cancer screening. Recommendations will vary depending on your family history and other risks. Hepatitis C blood test. Hepatitis B blood test. Sexually transmitted disease (STD) testing. Diabetes screening. This is done by checking your blood sugar (glucose) after you have not eaten for a while (fasting). You may have this done every 1-3 years. Abdominal aortic aneurysm (AAA) screening. You may need this if you are a current or former smoker. Osteoporosis. You may be screened starting at age 47 if you are at high risk. Talk with your health care provider about your test results, treatment options, and if necessary, the need for more tests. Vaccines  Your health care provider may recommend certain vaccines, such as: Influenza vaccine. This is recommended every year. Tetanus, diphtheria, and acellular pertussis (Tdap, Td) vaccine. You may need a Td booster every 10 years. Zoster vaccine. You may need this after age 39. Pneumococcal 13-valent conjugate (PCV13) vaccine. One dose is  recommended after age 3. Pneumococcal polysaccharide (PPSV23) vaccine. One dose is recommended after age 77. Talk to your health care provider about which screenings and vaccines you need and how often you need them. This information is not intended to replace advice given to you by your health care provider. Make sure you discuss any questions you have with your health care provider. Document Released: 01/26/2015 Document Revised: 09/19/2015 Document Reviewed: 10/31/2014 Elsevier Interactive Patient Education  2017 [place] Prevention in the Home Falls can cause injuries. They can happen to people of all ages. There are many things you can do to make your home safe and to help prevent falls. What can I do on the outside of my home? Regularly fix the edges of walkways and driveways and fix any cracks. Remove anything that might make you trip as you walk through a door, such as a raised step or threshold. Trim any bushes or trees on the path to your home. Use bright outdoor lighting. Clear any walking paths of anything that might make someone trip, such as rocks or tools. Regularly check to see if handrails are loose or broken. Make sure that both sides of any steps have handrails. Any raised decks and porches should have guardrails on the edges. Have any  leaves, snow, or ice cleared regularly. Use sand or salt on walking paths during winter. Clean up any spills in your garage right away. This includes oil or grease spills. What can I do in the bathroom? Use night lights. Install grab bars by the toilet and in the tub and shower. Do not use towel bars as grab bars. Use non-skid mats or decals in the tub or shower. If you need to sit down in the shower, use a plastic, non-slip stool. Keep the floor dry. Clean up any water that spills on the floor as soon as it happens. Remove soap buildup in the tub or shower regularly. Attach bath mats securely with double-sided non-slip rug  tape. Do not have throw rugs and other things on the floor that can make you trip. What can I do in the bedroom? Use night lights. Make sure that you have a light by your bed that is easy to reach. Do not use any sheets or blankets that are too big for your bed. They should not hang down onto the floor. Have a firm chair that has side arms. You can use this for support while you get dressed. Do not have throw rugs and other things on the floor that can make you trip. What can I do in the kitchen? Clean up any spills right away. Avoid walking on wet floors. Keep items that you use a lot in easy-to-reach places. If you need to reach something above you, use a strong step stool that has a grab bar. Keep electrical cords out of the way. Do not use floor polish or wax that makes floors slippery. If you must use wax, use non-skid floor wax. Do not have throw rugs and other things on the floor that can make you trip. What can I do with my stairs? Do not leave any items on the stairs. Make sure that there are handrails on both sides of the stairs and use them. Fix handrails that are broken or loose. Make sure that handrails are as long as the stairways. Check any carpeting to make sure that it is firmly attached to the stairs. Fix any carpet that is loose or worn. Avoid having throw rugs at the top or bottom of the stairs. If you do have throw rugs, attach them to the floor with carpet tape. Make sure that you have a light switch at the top of the stairs and the bottom of the stairs. If you do not have them, ask someone to add them for you. What else can I do to help prevent falls? Wear shoes that: Do not have high heels. Have rubber bottoms. Are comfortable and fit you well. Are closed at the toe. Do not wear sandals. If you use a stepladder: Make sure that it is fully opened. Do not climb a closed stepladder. Make sure that both sides of the stepladder are locked into place. Ask someone to  hold it for you, if possible. Clearly mark and make sure that you can see: Any grab bars or handrails. First and last steps. Where the edge of each step is. Use tools that help you move around (mobility aids) if they are needed. These include: Canes. Walkers. Scooters. Crutches. Turn on the lights when you go into a dark area. Replace any light bulbs as soon as they burn out. Set up your furniture so you have a clear path. Avoid moving your furniture around. If any of your floors are uneven, fix them. If  there are any pets around you, be aware of where they are. Review your medicines with your doctor. Some medicines can make you feel dizzy. This can increase your chance of falling. Ask your doctor what other things that you can do to help prevent falls. This information is not intended to replace advice given to you by your health care provider. Make sure you discuss any questions you have with your health care provider. Document Released: 10/26/2008 Document Revised: 06/07/2015 Document Reviewed: 02/03/2014 Elsevier Interactive Patient Education  2017 [employer].

## 2022-01-27 NOTE — Progress Notes (Signed)
Subjective:   Benjamin Buchanan is a 89 y.o. male who presents for Medicare Annual/Subsequent preventive examination.  I connected with  Benjamin Buchanan on 01/27/22 by a audio enabled telemedicine application and verified that I am speaking with the correct person using two identifiers.  Patient Location: Home  Provider Location: Home Office  I discussed the limitations of evaluation and management by telemedicine. The patient expressed understanding and agreed to proceed.  Review of Systems     Cardiac Risk Factors include: advanced age (>72mn, >>74women);dyslipidemia;hypertension;male gender     Objective:    Today's Vitals   01/27/22 0820  Weight: 135 lb (61.2 kg)  Height: '5\' 11"'$  (1.803 m)   Body mass index is 18.83 kg/m.     01/27/2022    8:31 AM 05/09/2021   11:34 AM 12/05/2020    8:35 AM 12/05/2019    9:00 AM 07/07/2019   10:02 AM 12/02/2018    8:44 AM 02/10/2018   12:20 PM  Advanced Directives  Does Patient Have a Medical Advance Directive? No No No Yes No;Yes No Yes  Type of AScientist, research (medical)Living will Living will;Healthcare Power of A[place]Living will  Does patient want to make changes to medical advance directive?    No - Patient declined   No - Patient declined  Copy of HRio Lajasin Chart?    No - copy requested   No - copy requested  Would patient like information on creating a medical advance directive? No - Patient declined No - Patient declined No - Patient declined   No - Patient declined     Current Medications (verified) Outpatient Encounter Medications as of 01/27/2022  Medication Sig   acetaminophen (TYLENOL) 325 MG tablet Take 325 mg by mouth 2 (two) times daily as needed for moderate pain.   amLODipine (NORVASC) 10 MG tablet TAKE 1 TABLET BY MOUTH EVERY DAY   aspirin 81 MG tablet Take 81 mg by mouth every Monday, Wednesday, and Friday.    benazepril  (LOTENSIN) 20 MG tablet TAKE 1 TABLET BY MOUTH EVERY DAY   Cholecalciferol 1000 UNITS capsule Take 1,000 Units by mouth daily.   Omega-3 Fatty Acids (FISH OIL PO) Take by mouth.   ONETOUCH ULTRA test strip CHECK BLOOD SUGAR EVERY DAY AND AS NEEDED   rosuvastatin (CRESTOR) 5 MG tablet TAKE 1 TABLET BY MOUTH EVERY DAY IN THE EVENING   vitamin C (ASCORBIC ACID) 500 MG tablet Take 500 mg by mouth 2 (two) times daily.    Facility-Administered Encounter Medications as of 01/27/2022  Medication   cyanocobalamin ((VITAMIN B-12)) injection 1,000 mcg    Allergies (verified) Lipitor [atorvastatin]   History: Past Medical History:  Diagnosis Date   Anemia    History of prostate cancer 1994   HLD (hyperlipidemia)    HTN (hypertension)    PVC (premature ventricular contraction)    history of   Past Surgical History:  Procedure Laterality Date   HERNIA REPAIR     INGUINAL HERNIA REPAIR Left 10/23/2016   Procedure: LEFT INGUINAL HERNIA REPAIR WITH MESH  ;  Surgeon: WRolm Bookbinder MD;  [facility]  Service: General;  Laterality: Left;   INSERTION OF MESH Left 10/23/2016   Procedure: INSERTION OF MESH;  Surgeon: WRolm Bookbinder MD;  [facility]  Service: General;  Laterality: Left;   LUMBAR LAMINECTOMY/DECOMPRESSION MICRODISCECTOMY N/A 02/10/2018   Procedure:  L2-3, L3-4 and L4-5 complete decompressive lumbar laminectomies for spinal stenosis, Foraminotomies  for L3, L4, L5 nerve roots.;  Surgeon: Latanya Maudlin, MD;  Location: [facility];  Service: Orthopedics;  Laterality: N/A;  168mn   PROSTATE SURGERY     Family History  Problem Relation Age of Onset   Cancer Father        prostate cancer   Hypertension Other        family history   Social History   Socioeconomic History   Marital status: Widowed    Spouse name: Not on file   Number of children: 2   Years of education: 10   Highest education level: 10th grade  Occupational  History   Occupation: retired  Tobacco Use   Smoking status: Never   Smokeless tobacco: Never  Vaping Use   Vaping Use: Never used  Substance and Sexual Activity   Alcohol use: No   Drug use: No   Sexual activity: Not Currently  Other Topics Concern   Not on file  Social History Narrative   Retired. Lives alone. No family nearby, but he eats out for breakfast and dinner with friends several times per week. Stays active. Still very independent 11/2020   Social Determinants of Health   Financial Resource Strain: Low Risk  (01/27/2022)   Overall Financial Resource Strain (CARDIA)    Difficulty of Paying Living Expenses: Not hard at all  Food Insecurity: No Food Insecurity (01/27/2022)   Hunger Vital Sign    Worried About Running Out of Food in the Last Year: Never true    Ran Out of Food in the Last Year: Never true  Transportation Needs: No Transportation Needs (01/27/2022)   PRAPARE - THydrologist(Medical): No    Lack of Transportation (Non-Medical): No  Physical Activity: Inactive (01/27/2022)   Exercise Vital Sign    Days of Exercise per Week: 0 days    Minutes of Exercise per Session: 0 min  Stress: No Stress Concern Present (01/27/2022)   FElrama   Feeling of Stress : Not at all  Social Connections: Moderately Isolated (01/27/2022)   Social Connection and Isolation Panel [NHANES]    Frequency of Communication with Friends and Family: More than three times a week    Frequency of Social Gatherings with Friends and Family: Three times a week    Attends Religious Services: Never    Active Member of Clubs or Organizations: Yes    Attends CArchivistMeetings: More than 4 times per year    Marital Status: Widowed    Tobacco Counseling Counseling given: Not Answered   Clinical Intake:  Pre-visit preparation completed: Yes  Pain : No/denies pain  Diabetes: No  How  often do you need to have someone help you when you read instructions, pamphlets, or other written materials from your doctor or pharmacy?: 1 - Never  Diabetic?No   Interpreter Needed?: No  Information entered by :: CDenman GeorgeLPN   Activities of Daily Living    01/27/2022    8:32 AM  In your present state of health, do you have any difficulty performing the following activities:  Hearing? 0  Vision? 0  Difficulty concentrating or making decisions? 0  Walking or climbing stairs? 0  Dressing or bathing? 0  Doing errands, shopping? 0  Preparing Food and eating ? N  Using the Toilet? N  In the past six months,  have you accidently leaked urine? N  Do you have problems with loss of bowel control? N  Managing your Medications? N  Managing your Finances? N  Housekeeping or managing your Housekeeping? N    Patient Care Team: Sharion Balloon, FNP as PCP - General (Family Medicine) Larey Dresser, MD as Consulting Physician (Cardiology)  Indicate any recent Medical Services you may have received from other than [facility] providers in the past year (date may be approximate).     Assessment:   This is a routine wellness examination for [place].  Hearing/Vision screen Hearing Screening - Comments:: Denies hearing difficulties  Vision Screening - Comments:: Wears rx glasses - up to date with routine eye exams    Dietary issues and exercise activities discussed: Current Exercise Habits: The patient does not participate in regular exercise at present   Goals Addressed             This Visit's Progress    DIET - INCREASE WATER INTAKE   On track    Try to drink 6-8 glasses of water daily      Depression Screen    01/27/2022    8:35 AM 11/25/2021    7:59 AM 07/25/2021    3:14 PM 04/15/2021    7:56 AM 12/05/2020    8:36 AM 10/23/2020    8:09 AM 10/11/2020   11:42 AM  PHQ 2/9 Scores  PHQ - 2 Score 0 0 0 0 0 0 0  PHQ- 9 Score  0   0 0     Fall Risk    01/27/2022     8:20 AM 11/25/2021    7:59 AM 07/25/2021    3:36 PM 07/25/2021    3:14 PM 04/15/2021    7:56 AM  [place] in the past year? 0 0 0 0 0  Number falls in past yr: 0      Injury with Fall? 0      Risk for fall due to : No Fall Risks      Follow up Falls evaluation completed;Education provided;Falls prevention discussed        FALL RISK PREVENTION PERTAINING TO THE HOME:  Any stairs in or around the home? No  If so, are there any without handrails? No  Home free of loose throw rugs in walkways, pet beds, electrical cords, etc? Yes  Adequate lighting in your home to reduce risk of falls? Yes   ASSISTIVE DEVICES UTILIZED TO PREVENT FALLS:  Life alert? No  Use of a cane, walker or w/c? No  Grab bars in the bathroom? Yes  Shower chair or bench in shower? No  Elevated toilet seat or a handicapped toilet? Yes   TIMED UP AND GO:  Was the test performed? No . Telephonic visit   Cognitive Function:    02/21/2015   12:05 PM 09/27/2014    2:18 PM  MMSE - Mini Mental State Exam  Orientation to time 5 5  Orientation to Place 5 5  Registration 3 3  Attention/ Calculation 5 5  Recall 2 2  Language- name 2 objects 2 2  Language- repeat 1 1  Language- follow 3 step command 2 3  Language- read & follow direction 1 1  Write a sentence 1 1  Copy design 1 1  Total score 28 29        01/27/2022    8:32 AM 12/05/2020    8:40 AM 12/05/2019    9:02 AM 12/02/2018  8:49 AM  6CIT Screen  What Year? 0 points 0 points 0 points 0 points  What month? 0 points 0 points 0 points 0 points  What time? 0 points 0 points 0 points 0 points  Count back from 20 0 points 0 points 0 points 4 points  Months in reverse 0 points 2 points 2 points 0 points  Repeat phrase 2 points 2 points 0 points 2 points  Total Score 2 points 4 points 2 points 6 points    Immunizations Immunization History  Administered Date(s) Administered   Fluad Quad(high Dose 65+) 10/29/2018, 10/16/2020, 10/11/2021    Influenza Split 10/16/2020   Influenza, High Dose Seasonal PF 11/18/2016, 10/19/2017   Influenza,inj,Quad PF,6+ Mos 12/14/2012, 11/01/2013, 12/06/2014   Influenza-Unspecified 11/29/2008, 10/24/2009, 10/20/2011, 11/09/2015, 11/22/2019   Moderna SARS-COV2 Booster Vaccination 10/16/2020   Moderna Sars-Covid-2 Vaccination 02/14/2019, 03/14/2019, 11/22/2019   Pneumococcal Conjugate-13 07/19/2013   Pneumococcal Polysaccharide-23 10/24/2009   Td 10/24/2010   Tdap 04/24/2021   Zoster Recombinat (Shingrix) 04/15/2021, 07/25/2021   Zoster, Live 11/05/2011    TDAP status: Up to date  Flu Vaccine status: Up to date  Pneumococcal vaccine status: Up to date  Covid-19 vaccine status: Information provided on how to obtain vaccines.   Qualifies for Shingles Vaccine? Yes   Zostavax completed Yes   Shingrix Completed?: Yes  Screening Tests Health Maintenance  Topic Date Due   COVID-19 Vaccine (5 - 2023-24 season) 09/13/2021   Medicare Annual Wellness (AWV)  01/28/2023   DTaP/Tdap/Td (3 - Td or Tdap) 04/25/2031   Pneumonia Vaccine 51+ Years old  Completed   INFLUENZA VACCINE  Completed   Zoster Vaccines- Shingrix  Completed   HPV VACCINES  Aged Out   COLONOSCOPY (Pts 45-62yr Insurance coverage will need to be confirmed)  Discontinued    Health Maintenance  Health Maintenance Due  Topic Date Due   COVID-19 Vaccine (5 - 2023-24 season) 09/13/2021    Colorectal cancer screening: No longer required.   Lung Cancer Screening: (Low Dose CT Chest recommended if Age 764-80years, 30 pack-year currently smoking OR have quit w/in 15years.) does not qualify.   Lung Cancer Screening Referral: n/a   Additional Screening:  Hepatitis C Screening: does not qualify  Vision Screening: Recommended annual ophthalmology exams for early detection of glaucoma and other disorders of the eye. Is the patient up to date with their annual eye exam?  Yes  Who is the provider or what is the name of the office  in which the patient attends annual eye exams? Unable to provide name  If pt is not established with a provider, would they like to be referred to a provider to establish care? No .   Dental Screening: Recommended annual dental exams for proper oral hygiene  Community Resource Referral / Chronic Care Management: CRR required this visit?  No   CCM required this visit?  No      Plan:     I have personally reviewed and noted the following in the patient's chart:   Medical and social history Use of alcohol, tobacco or illicit drugs  Current medications and supplements including opioid prescriptions. Patient is not currently taking opioid prescriptions. Functional ability and status Nutritional status Physical activity Advanced directives List of other physicians Hospitalizations, surgeries, and ER visits in previous 12 months Vitals Screenings to include cognitive, depression, and falls Referrals and appointments  In addition, I have reviewed and discussed with patient certain preventive protocols, quality metrics, and best practice recommendations.  A written personalized care plan for preventive services as well as general preventive health recommendations were provided to patient.     Vanetta Mulders, [place]   [id number]   Due to this being a virtual visit, the after visit summary with patients personalized plan was offered to patient via mail or my-chart. per request, patient was mailed a copy of AVS  Nurse Notes: No concerns

## 2022-02-26 ENCOUNTER — Other Ambulatory Visit: Payer: Self-pay | Admitting: Family

## 2022-02-27 ENCOUNTER — Other Ambulatory Visit: Payer: Self-pay | Admitting: Family

## 2022-02-27 DIAGNOSIS — I700 Atherosclerosis of aorta: Secondary | ICD-10-CM

## 2022-02-27 DIAGNOSIS — E7800 Pure hypercholesterolemia, unspecified: Secondary | ICD-10-CM

## 2022-03-13 ENCOUNTER — Telehealth: Payer: Self-pay | Admitting: Family

## 2022-03-13 NOTE — Telephone Encounter (Signed)
na

## 2022-03-28 ENCOUNTER — Other Ambulatory Visit: Payer: Self-pay | Admitting: Family

## 2022-03-28 DIAGNOSIS — I10 Essential (primary) hypertension: Secondary | ICD-10-CM

## 2022-05-20 DIAGNOSIS — M1990 Unspecified osteoarthritis, unspecified site: Secondary | ICD-10-CM | POA: Diagnosis not present

## 2022-05-20 DIAGNOSIS — I129 Hypertensive chronic kidney disease with stage 1 through stage 4 chronic kidney disease, or unspecified chronic kidney disease: Secondary | ICD-10-CM | POA: Diagnosis not present

## 2022-05-20 DIAGNOSIS — N182 Chronic kidney disease, stage 2 (mild): Secondary | ICD-10-CM | POA: Diagnosis not present

## 2022-05-20 DIAGNOSIS — K08409 Partial loss of teeth, unspecified cause, unspecified class: Secondary | ICD-10-CM | POA: Diagnosis not present

## 2022-05-20 DIAGNOSIS — I252 Old myocardial infarction: Secondary | ICD-10-CM | POA: Diagnosis not present

## 2022-05-20 DIAGNOSIS — M5430 Sciatica, unspecified side: Secondary | ICD-10-CM | POA: Diagnosis not present

## 2022-05-20 DIAGNOSIS — M4800 Spinal stenosis, site unspecified: Secondary | ICD-10-CM | POA: Diagnosis not present

## 2022-05-20 DIAGNOSIS — I2510 Atherosclerotic heart disease of native coronary artery without angina pectoris: Secondary | ICD-10-CM | POA: Diagnosis not present

## 2022-05-20 DIAGNOSIS — Z7982 Long term (current) use of aspirin: Secondary | ICD-10-CM | POA: Diagnosis not present

## 2022-05-20 DIAGNOSIS — E785 Hyperlipidemia, unspecified: Secondary | ICD-10-CM | POA: Diagnosis not present

## 2022-05-20 DIAGNOSIS — Z8546 Personal history of malignant neoplasm of prostate: Secondary | ICD-10-CM | POA: Diagnosis not present

## 2022-05-23 ENCOUNTER — Other Ambulatory Visit: Payer: Self-pay | Admitting: Family

## 2022-05-23 DIAGNOSIS — I700 Atherosclerosis of aorta: Secondary | ICD-10-CM

## 2022-05-23 DIAGNOSIS — E7800 Pure hypercholesterolemia, unspecified: Secondary | ICD-10-CM

## 2022-05-26 ENCOUNTER — Ambulatory Visit (INDEPENDENT_AMBULATORY_CARE_PROVIDER_SITE_OTHER): Payer: Medicare HMO | Admitting: Family

## 2022-05-26 ENCOUNTER — Encounter: Payer: Self-pay | Admitting: Family

## 2022-05-26 VITALS — BP 131/61 | HR 74 | Temp 99.1°F | Ht 71.0 in | Wt 130.4 lb

## 2022-05-26 DIAGNOSIS — Z0000 Encounter for general adult medical examination without abnormal findings: Secondary | ICD-10-CM

## 2022-05-26 DIAGNOSIS — N1831 Chronic kidney disease, stage 3a: Secondary | ICD-10-CM | POA: Diagnosis not present

## 2022-05-26 DIAGNOSIS — I700 Atherosclerosis of aorta: Secondary | ICD-10-CM

## 2022-05-26 DIAGNOSIS — I129 Hypertensive chronic kidney disease with stage 1 through stage 4 chronic kidney disease, or unspecified chronic kidney disease: Secondary | ICD-10-CM

## 2022-05-26 DIAGNOSIS — M51379 Other intervertebral disc degeneration, lumbosacral region without mention of lumbar back pain or lower extremity pain: Secondary | ICD-10-CM

## 2022-05-26 DIAGNOSIS — E559 Vitamin D deficiency, unspecified: Secondary | ICD-10-CM | POA: Diagnosis not present

## 2022-05-26 DIAGNOSIS — Z0001 Encounter for general adult medical examination with abnormal findings: Secondary | ICD-10-CM

## 2022-05-26 DIAGNOSIS — M5137 Other intervertebral disc degeneration, lumbosacral region: Secondary | ICD-10-CM

## 2022-05-26 DIAGNOSIS — I10 Essential (primary) hypertension: Secondary | ICD-10-CM | POA: Diagnosis not present

## 2022-05-26 DIAGNOSIS — E7800 Pure hypercholesterolemia, unspecified: Secondary | ICD-10-CM

## 2022-05-26 NOTE — Patient Instructions (Signed)
Health Maintenance After Age 87 After age 87, you are at a higher risk for certain long-term diseases and infections as well as injuries from falls. Falls are a major cause of broken bones and head injuries in people who are older than age 87. Getting regular preventive care can help to keep you healthy and well. Preventive care includes getting regular testing and making lifestyle changes as recommended by your health care provider. Talk with your health care provider about: Which screenings and tests you should have. A screening is a test that checks for a disease when you have no symptoms. A diet and exercise plan that is right for you. What should I know about screenings and tests to prevent falls? Screening and testing are the best ways to find a health problem early. Early diagnosis and treatment give you the best chance of managing medical conditions that are common after age 87. Certain conditions and lifestyle choices may make you more likely to have a fall. Your health care provider may recommend: Regular vision checks. Poor vision and conditions such as cataracts can make you more likely to have a fall. If you wear glasses, make sure to get your prescription updated if your vision changes. Medicine review. Work with your health care provider to regularly review all of the medicines you are taking, including over-the-counter medicines. Ask your health care provider about any side effects that may make you more likely to have a fall. Tell your health care provider if any medicines that you take make you feel dizzy or sleepy. Strength and balance checks. Your health care provider may recommend certain tests to check your strength and balance while standing, walking, or changing positions. Foot health exam. Foot pain and numbness, as well as not wearing proper footwear, can make you more likely to have a fall. Screenings, including: Osteoporosis screening. Osteoporosis is a condition that causes  the bones to get weaker and break more easily. Blood pressure screening. Blood pressure changes and medicines to control blood pressure can make you feel dizzy. Depression screening. You may be more likely to have a fall if you have a fear of falling, feel depressed, or feel unable to do activities that you used to do. Alcohol use screening. Using too much alcohol can affect your balance and may make you more likely to have a fall. Follow these instructions at home: Lifestyle Do not drink alcohol if: Your health care provider tells you not to drink. If you drink alcohol: Limit how much you have to: 0-1 drink a day for women. 0-2 drinks a day for men. Know how much alcohol is in your drink. In the [place] one drink equals one 12 oz bottle of beer (355 mL), one 5 oz glass of wine (148 mL), or one 1 oz glass of hard liquor (44 mL). Do not use any products that contain nicotine or tobacco. These products include cigarettes, chewing tobacco, and vaping devices, such as e-cigarettes. If you need help quitting, ask your health care provider. Activity  Follow a regular exercise program to stay fit. This will help you maintain your balance. Ask your health care provider what types of exercise are appropriate for you. If you need a cane or walker, use it as recommended by your health care provider. Wear supportive shoes that have nonskid soles. Safety  Remove any tripping hazards, such as rugs, cords, and clutter. Install safety equipment such as grab bars in bathrooms and safety rails on stairs. Keep rooms and walkways   well-lit. General instructions Talk with your health care provider about your risks for falling. Tell your health care provider if: You fall. Be sure to tell your health care provider about all falls, even ones that seem minor. You feel dizzy, tiredness (fatigue), or off-balance. Take over-the-counter and prescription medicines only as told by your health care provider. These include  supplements. Eat a healthy diet and maintain a healthy weight. A healthy diet includes low-fat dairy products, low-fat (lean) meats, and fiber from whole grains, beans, and lots of fruits and vegetables. Stay current with your vaccines. Schedule regular health, dental, and eye exams. Summary Having a healthy lifestyle and getting preventive care can help to protect your health and wellness after age 87. Screening and testing are the best way to find a health problem early and help you avoid having a fall. Early diagnosis and treatment give you the best chance for managing medical conditions that are more common for people who are older than age 87. Falls are a major cause of broken bones and head injuries in people who are older than age 87. Take precautions to prevent a fall at home. Work with your health care provider to learn what changes you can make to improve your health and wellness and to prevent falls. This information is not intended to replace advice given to you by your health care provider. Make sure you discuss any questions you have with your health care provider. Document Revised: 05/21/2020 Document Reviewed: 05/21/2020 Elsevier Patient Education  2023 Elsevier Inc.  

## 2022-05-26 NOTE — Progress Notes (Signed)
Subjective:    [name], male    DOB: Nov 28, 1928, 89 y.o.   MRN: [id number]  Chief Complaint  Patient presents with   Medical Management of Chronic Issues    6 month    Pt presents to the office today for CPE and  chronic follow up. He has CKD and limits his NSAID's.   He has aortic atherosclerosis and takes Crestor daily.    Hypertension This is a chronic problem. The current episode started more than 1 year ago. The problem has been resolved since onset. The problem is controlled. Pertinent negatives include no malaise/fatigue, peripheral edema or shortness of breath. The current treatment provides moderate improvement.  Back Pain This is a chronic problem. The current episode started more than 1 year ago. The problem occurs intermittently. The problem has been resolved since onset. The pain is present in the lumbar spine. The quality of the pain is described as aching. The pain is at a severity of 8/10. The pain is mild. Treatments tried: tylenol. The treatment provided mild relief.  Hyperlipidemia This is a chronic problem. The current episode started more than 1 year ago. Pertinent negatives include no shortness of breath. Current antihyperlipidemic treatment includes statins. The current treatment provides moderate improvement of lipids. Risk factors for coronary artery disease include dyslipidemia, hypertension and male sex.      Review of Systems  Constitutional:  Negative for malaise/fatigue.  Respiratory:  Negative for shortness of breath.   Musculoskeletal:  Positive for back pain.  All other systems reviewed and are negative.  Family History  Problem Relation Age of Onset   Cancer Father        prostate cancer   Hypertension Other        family history   Social History   Socioeconomic History   Marital status: Widowed    Spouse name: Not on file   Number of children: 2   Years of education: 10   Highest education level: 10th grade   Occupational History   Occupation: retired  Tobacco Use   Smoking status: Never   Smokeless tobacco: Never  Vaping Use   Vaping Use: Never used  Substance and Sexual Activity   Alcohol use: No   Drug use: No   Sexual activity: Not Currently  Other Topics Concern   Not on file  Social History Narrative   Retired. Lives alone. No family nearby, but he eats out for breakfast and dinner with friends several times per week. Stays active. Still very independent 11/2020   Social Determinants of Health   Financial Resource Strain: Low Risk  (01/27/2022)   Overall Financial Resource Strain (CARDIA)    Difficulty of Paying Living Expenses: Not hard at all  Food Insecurity: No Food Insecurity (01/27/2022)   Hunger Vital Sign    Worried About Running Out of Food in the Last Year: Never true    Ran Out of Food in the Last Year: Never true  Transportation Needs: No Transportation Needs (01/27/2022)   PRAPARE - Administrator, Civil Service (Medical): No    Lack of Transportation (Non-Medical): No  Physical Activity: Inactive (01/27/2022)   Exercise Vital Sign    Days of Exercise per Week: 0 days    Minutes of Exercise per Session: 0 min  Stress: No Stress Concern Present (01/27/2022)   [employer] of Occupational Health - Occupational Stress Questionnaire    Feeling of Stress : Not at all  Social  Connections: Moderately Isolated (01/27/2022)   Social Connection and Isolation Panel [NHANES]    Frequency of Communication with Friends and Family: More than three times a week    Frequency of Social Gatherings with Friends and Family: Three times a week    Attends Religious Services: Never    Active Member of Clubs or Organizations: Yes    Attends Banker Meetings: More than 4 times per year    Marital Status: Widowed        Objective:   Physical Exam Vitals reviewed.  Constitutional:      General: He is not in acute distress.    Appearance: He is  well-developed.  HENT:     Head: Normocephalic.     Right Ear: Tympanic membrane normal.     Left Ear: Tympanic membrane normal.  Eyes:     General:        Right eye: No discharge.        Left eye: No discharge.     Pupils: Pupils are equal, round, and reactive to light.  Neck:     Thyroid: No thyromegaly.  Cardiovascular:     Rate and Rhythm: Normal rate and regular rhythm.     Heart sounds: Normal heart sounds. No murmur heard. Pulmonary:     Effort: Pulmonary effort is normal. No respiratory distress.     Breath sounds: Normal breath sounds. No wheezing.  Abdominal:     General: Bowel sounds are normal. There is no distension.     Palpations: Abdomen is soft.     Tenderness: There is no abdominal tenderness.  Musculoskeletal:        General: No tenderness. Normal range of motion.     Cervical back: Normal range of motion and neck supple.  Skin:    General: Skin is warm and dry.     Findings: No erythema or rash.  Neurological:     Mental Status: He is alert and oriented to person, place, and time.     Cranial Nerves: No cranial nerve deficit.     Deep Tendon Reflexes: Reflexes are normal and symmetric.  Psychiatric:        Behavior: Behavior normal.        Thought Content: Thought content normal.        Judgment: Judgment normal.       BP 131/61   Pulse 74   Temp 99.1 F (37.3 C) (Temporal)   Ht 5\' 11"  (1.803 m)   Wt 130 lb 6.4 oz (59.1 kg)   SpO2 98%   BMI 18.19 kg/m      Assessment & Plan:  Benjamin Buchanan comes in today with chief complaint of Medical Management of Chronic Issues (6 month )   Diagnosis and orders addressed:  1. Annual physical exam - CMP14+EGFR - CBC with Differential/Platelet - Lipid panel - TSH - VITAMIN D 25 Hydroxy (Vit-D Deficiency, Fractures)  2. Degeneration of lumbosacral intervertebral disc - CMP14+EGFR - CBC with Differential/Platelet  3. Essential hypertension - CMP14+EGFR - CBC with  Differential/Platelet  4. Pure hypercholesterolemia - CMP14+EGFR - CBC with Differential/Platelet - Lipid panel  5. Stage 3a chronic kidney disease (HCC)  - CMP14+EGFR - CBC with Differential/Platelet  6. Vitamin D deficiency - CMP14+EGFR - CBC with Differential/Platelet - VITAMIN D 25 Hydroxy (Vit-D Deficiency, Fractures)  7. Thoracic aortic atherosclerosis (HCC)  - CMP14+EGFR - CBC with Differential/Platelet   Labs pending Health Maintenance reviewed Diet and exercise encouraged  Follow up plan:  6 months    [name], FNP

## 2022-05-27 LAB — CBC WITH DIFFERENTIAL/PLATELET
Basophils Absolute: 0.1 10*3/uL (ref 0.0–0.2)
Basos: 1 %
EOS (ABSOLUTE): 0.1 10*3/uL (ref 0.0–0.4)
Eos: 1 %
Hematocrit: 34.7 % — ABNORMAL LOW (ref 37.5–51.0)
Hemoglobin: 11.5 g/dL — ABNORMAL LOW (ref 13.0–17.7)
Immature Grans (Abs): 0 10*3/uL (ref 0.0–0.1)
Immature Granulocytes: 0 %
Lymphocytes Absolute: 1.2 10*3/uL (ref 0.7–3.1)
Lymphs: 13 %
MCH: 31.7 pg (ref 26.6–33.0)
MCHC: 33.1 g/dL (ref 31.5–35.7)
MCV: 96 fL (ref 79–97)
Monocytes Absolute: 0.7 10*3/uL (ref 0.1–0.9)
Monocytes: 7 %
Neutrophils Absolute: 7.2 10*3/uL — ABNORMAL HIGH (ref 1.4–7.0)
Neutrophils: 78 %
Platelets: 325 10*3/uL (ref 150–450)
RBC: 3.63 x10E6/uL — ABNORMAL LOW (ref 4.14–5.80)
RDW: 12.6 % (ref 11.6–15.4)
WBC: 9.2 10*3/uL (ref 3.4–10.8)

## 2022-05-27 LAB — CMP14+EGFR
ALT: 12 IU/L (ref 0–44)
AST: 22 IU/L (ref 0–40)
Albumin/Globulin Ratio: 1.7 (ref 1.2–2.2)
Albumin: 3.9 g/dL (ref 3.6–4.6)
Alkaline Phosphatase: 78 IU/L (ref 44–121)
BUN/Creatinine Ratio: 19 (ref 10–24)
BUN: 23 mg/dL (ref 10–36)
Bilirubin Total: 0.3 mg/dL (ref 0.0–1.2)
CO2: 21 mmol/L (ref 20–29)
Calcium: 9.3 mg/dL (ref 8.6–10.2)
Chloride: 109 mmol/L — ABNORMAL HIGH (ref 96–106)
Creatinine, Ser: 1.23 mg/dL (ref 0.76–1.27)
Globulin, Total: 2.3 g/dL (ref 1.5–4.5)
Glucose: 111 mg/dL — ABNORMAL HIGH (ref 70–99)
Potassium: 4.4 mmol/L (ref 3.5–5.2)
Sodium: 145 mmol/L — ABNORMAL HIGH (ref 134–144)
Total Protein: 6.2 g/dL (ref 6.0–8.5)
eGFR: 55 mL/min/{1.73_m2} — ABNORMAL LOW (ref >59)

## 2022-05-27 LAB — LIPID PANEL
Chol/HDL Ratio: 2.4 ratio (ref 0.0–5.0)
Cholesterol, Total: 151 mg/dL (ref 100–199)
HDL: 63 mg/dL (ref >39)
LDL Chol Calc (NIH): 63 mg/dL (ref 0–99)
Triglycerides: 147 mg/dL (ref 0–149)
VLDL Cholesterol Cal: 25 mg/dL (ref 5–40)

## 2022-05-27 LAB — VITAMIN D 25 HYDROXY (VIT D DEFICIENCY, FRACTURES): Vit D, 25-Hydroxy: 36.8 ng/mL (ref 30.0–100.0)

## 2022-05-27 LAB — TSH: TSH: 0.802 u[IU]/mL (ref 0.450–4.500)

## 2022-06-21 ENCOUNTER — Other Ambulatory Visit: Payer: Self-pay | Admitting: Family

## 2022-06-21 DIAGNOSIS — I10 Essential (primary) hypertension: Secondary | ICD-10-CM

## 2022-07-12 ENCOUNTER — Encounter (HOSPITAL_COMMUNITY): Payer: Self-pay

## 2022-07-12 ENCOUNTER — Emergency Department (HOSPITAL_COMMUNITY): Payer: Medicare HMO

## 2022-07-12 ENCOUNTER — Inpatient Hospital Stay (HOSPITAL_COMMUNITY)
Admission: EM | Admit: 2022-07-12 | Discharge: 2022-07-18 | DRG: 445 | Disposition: A | Discharge status: Home Health | Payer: Medicare HMO | Attending: Internal Medicine | Admitting: Internal Medicine

## 2022-07-12 ENCOUNTER — Other Ambulatory Visit: Payer: Self-pay

## 2022-07-12 DIAGNOSIS — Z7982 Long term (current) use of aspirin: Secondary | ICD-10-CM

## 2022-07-12 DIAGNOSIS — Z8546 Personal history of malignant neoplasm of prostate: Secondary | ICD-10-CM

## 2022-07-12 DIAGNOSIS — Z8249 Family history of ischemic heart disease and other diseases of the circulatory system: Secondary | ICD-10-CM

## 2022-07-12 DIAGNOSIS — R7401 Elevation of levels of liver transaminase levels: Secondary | ICD-10-CM | POA: Insufficient documentation

## 2022-07-12 DIAGNOSIS — Z79899 Other long term (current) drug therapy: Secondary | ICD-10-CM

## 2022-07-12 DIAGNOSIS — K8030 Calculus of bile duct with cholangitis, unspecified, without obstruction: Principal | ICD-10-CM | POA: Diagnosis present

## 2022-07-12 DIAGNOSIS — K828 Other specified diseases of gallbladder: Secondary | ICD-10-CM | POA: Diagnosis not present

## 2022-07-12 DIAGNOSIS — I493 Ventricular premature depolarization: Secondary | ICD-10-CM

## 2022-07-12 DIAGNOSIS — Z9189 Other specified personal risk factors, not elsewhere classified: Secondary | ICD-10-CM

## 2022-07-12 DIAGNOSIS — E782 Mixed hyperlipidemia: Secondary | ICD-10-CM | POA: Diagnosis present

## 2022-07-12 DIAGNOSIS — E785 Hyperlipidemia, unspecified: Secondary | ICD-10-CM

## 2022-07-12 DIAGNOSIS — R1013 Epigastric pain: Secondary | ICD-10-CM | POA: Diagnosis not present

## 2022-07-12 DIAGNOSIS — N2889 Other specified disorders of kidney and ureter: Secondary | ICD-10-CM | POA: Diagnosis present

## 2022-07-12 DIAGNOSIS — R7881 Bacteremia: Secondary | ICD-10-CM | POA: Diagnosis present

## 2022-07-12 DIAGNOSIS — K8070 Calculus of gallbladder and bile duct without cholecystitis without obstruction: Secondary | ICD-10-CM | POA: Diagnosis present

## 2022-07-12 DIAGNOSIS — R748 Abnormal levels of other serum enzymes: Secondary | ICD-10-CM | POA: Insufficient documentation

## 2022-07-12 DIAGNOSIS — R110 Nausea: Secondary | ICD-10-CM | POA: Insufficient documentation

## 2022-07-12 DIAGNOSIS — Z8042 Family history of malignant neoplasm of prostate: Secondary | ICD-10-CM

## 2022-07-12 DIAGNOSIS — N1831 Chronic kidney disease, stage 3a: Secondary | ICD-10-CM | POA: Diagnosis present

## 2022-07-12 DIAGNOSIS — Z888 Allergy status to other drugs, medicaments and biological substances status: Secondary | ICD-10-CM

## 2022-07-12 DIAGNOSIS — R109 Unspecified abdominal pain: Secondary | ICD-10-CM | POA: Diagnosis present

## 2022-07-12 DIAGNOSIS — I10 Essential (primary) hypertension: Secondary | ICD-10-CM

## 2022-07-12 DIAGNOSIS — K838 Other specified diseases of biliary tract: Secondary | ICD-10-CM | POA: Diagnosis not present

## 2022-07-12 DIAGNOSIS — I129 Hypertensive chronic kidney disease with stage 1 through stage 4 chronic kidney disease, or unspecified chronic kidney disease: Secondary | ICD-10-CM | POA: Diagnosis present

## 2022-07-12 DIAGNOSIS — N281 Cyst of kidney, acquired: Secondary | ICD-10-CM | POA: Diagnosis not present

## 2022-07-12 DIAGNOSIS — B961 Klebsiella pneumoniae [K. pneumoniae] as the cause of diseases classified elsewhere: Secondary | ICD-10-CM | POA: Diagnosis present

## 2022-07-12 LAB — CBC WITH DIFFERENTIAL/PLATELET
Abs Immature Granulocytes: 0.02 10*3/uL (ref 0.00–0.07)
Basophils Absolute: 0 10*3/uL (ref 0.0–0.1)
Basophils Relative: 0 %
Eosinophils Absolute: 0 10*3/uL (ref 0.0–0.5)
Eosinophils Relative: 1 %
HCT: 35 % — ABNORMAL LOW (ref 39.0–52.0)
Hemoglobin: 11.3 g/dL — ABNORMAL LOW (ref 13.0–17.0)
Immature Granulocytes: 0 %
Lymphocytes Relative: 3 %
Lymphs Abs: 0.2 10*3/uL — ABNORMAL LOW (ref 0.7–4.0)
MCH: 31.5 pg (ref 26.0–34.0)
MCHC: 32.3 g/dL (ref 30.0–36.0)
MCV: 97.5 fL (ref 80.0–100.0)
Monocytes Absolute: 0 10*3/uL — ABNORMAL LOW (ref 0.1–1.0)
Monocytes Relative: 0 %
Neutro Abs: 7 10*3/uL (ref 1.7–7.7)
Neutrophils Relative %: 96 %
Platelets: 246 10*3/uL (ref 150–400)
RBC: 3.59 MIL/uL — ABNORMAL LOW (ref 4.22–5.81)
RDW: 12.8 % (ref 11.5–15.5)
WBC: 7.4 10*3/uL (ref 4.0–10.5)
nRBC: 0 % (ref 0.0–0.2)

## 2022-07-12 LAB — COMPREHENSIVE METABOLIC PANEL
ALT: 217 U/L — ABNORMAL HIGH (ref 0–44)
AST: 387 U/L — ABNORMAL HIGH (ref 15–41)
Albumin: 3.6 g/dL (ref 3.5–5.0)
Alkaline Phosphatase: 98 U/L (ref 38–126)
Anion gap: 8 (ref 5–15)
BUN: 28 mg/dL — ABNORMAL HIGH (ref 8–23)
CO2: 22 mmol/L (ref 22–32)
Calcium: 8.9 mg/dL (ref 8.9–10.3)
Chloride: 110 mmol/L (ref 98–111)
Creatinine, Ser: 1.27 mg/dL — ABNORMAL HIGH (ref 0.61–1.24)
GFR, Estimated: 53 mL/min — ABNORMAL LOW (ref >60)
Glucose, Bld: 145 mg/dL — ABNORMAL HIGH (ref 70–99)
Potassium: 4.1 mmol/L (ref 3.5–5.1)
Sodium: 140 mmol/L (ref 135–145)
Total Bilirubin: 1.1 mg/dL (ref 0.3–1.2)
Total Protein: 6.7 g/dL (ref 6.5–8.1)

## 2022-07-12 LAB — LIPASE, BLOOD: Lipase: 65 U/L — ABNORMAL HIGH (ref 11–51)

## 2022-07-12 MED ORDER — SODIUM CHLORIDE 0.9 % IV BOLUS
500.0000 mL | Freq: Once | INTRAVENOUS | Status: AC
  Administered 2022-07-12: 500 mL via INTRAVENOUS

## 2022-07-12 MED ORDER — ONDANSETRON HCL 4 MG/2ML IJ SOLN
4.0000 mg | Freq: Once | INTRAMUSCULAR | Status: AC
  Administered 2022-07-12: 4 mg via INTRAVENOUS
  Filled 2022-07-12: qty 2

## 2022-07-12 MED ORDER — IOHEXOL 300 MG/ML  SOLN
100.0000 mL | Freq: Once | INTRAMUSCULAR | Status: AC | PRN
  Administered 2022-07-12: 100 mL via INTRAVENOUS

## 2022-07-12 NOTE — ED Notes (Signed)
Unable to give ua sample at this time.  

## 2022-07-12 NOTE — ED Provider Notes (Signed)
[name] [facility] DEPARTMENT AT [facility] Provider Note   CSN: [id number] Arrival date & time: 07/12/22  2140     History  Chief Complaint  Patient presents with   Abdominal Pain    Benjamin Buchanan is a 89 y.o. male.  Pt is a 89 yo male with pmhx significant for pvcs, hld, htn, prostate cancer and anemia. Pt developed upper abd pain and nausea today.  No fevers.        Home Medications Prior to Admission medications   Medication Sig Start Date End Date Taking? Authorizing Provider  acetaminophen (TYLENOL) 325 MG tablet Take 325 mg by mouth 2 (two) times daily as needed for moderate pain.    [provider]  amLODipine (NORVASC) 10 MG tablet TAKE 1 TABLET BY MOUTH EVERY DAY 06/23/22   [name] A, FNP  aspirin 81 MG tablet Take 81 mg by mouth every Monday, Wednesday, and Friday.     [provider]  benazepril (LOTENSIN) 20 MG tablet TAKE 1 TABLET BY MOUTH EVERY DAY 05/23/22   [name] A, FNP  Cholecalciferol 1000 UNITS capsule Take 1,000 Units by mouth daily.    [provider]  Omega-3 Fatty Acids (FISH OIL PO) Take by mouth.    [provider]  ONETOUCH ULTRA test strip CHECK BLOOD SUGAR EVERY DAY AND AS NEEDED 09/20/21   [name] A, FNP  rosuvastatin (CRESTOR) 5 MG tablet TAKE 1 TABLET BY MOUTH EVERY DAY IN THE EVENING 05/23/22   [name] A, FNP  vitamin C (ASCORBIC ACID) 500 MG tablet Take 500 mg by mouth 2 (two) times daily.     [provider]      Allergies    Lipitor [atorvastatin]    Review of Systems   Review of Systems  Gastrointestinal:  Positive for abdominal pain and nausea.  All other systems reviewed and are negative.   Physical Exam Updated Vital Signs BP (!) 163/77   Pulse 87   Temp (!) 97.5 F (36.4 C) (Oral)   Resp (!) 21   Ht 5\' 10"  (1.778 m)   Wt 61.2 kg   SpO2 97%   BMI 19.37 kg/m  Physical Exam Vitals and nursing note reviewed.  Constitutional:       Appearance: He is well-developed.  HENT:     Head: Normocephalic and atraumatic.     Mouth/Throat:     Mouth: Mucous membranes are moist.     Pharynx: Oropharynx is clear.  Eyes:     Extraocular Movements: Extraocular movements intact.     Pupils: Pupils are equal, round, and reactive to light.  Cardiovascular:     Rate and Rhythm: Normal rate and regular rhythm.     Heart sounds: Normal heart sounds.  Pulmonary:     Effort: Pulmonary effort is normal.     Breath sounds: Normal breath sounds.  Abdominal:     General: Abdomen is flat. Bowel sounds are normal.     Palpations: Abdomen is soft.     Tenderness: There is abdominal tenderness in the right upper quadrant and epigastric area.  Skin:    General: Skin is warm.     Capillary Refill: Capillary refill takes less than 2 seconds.  Neurological:     General: No focal deficit present.     Mental Status: He is alert and oriented to person, place, and time.  Psychiatric:        Mood and Affect: Mood normal.  Behavior: Behavior normal.     ED Results / Procedures / Treatments   Labs (all labs ordered are listed, but only abnormal results are displayed) Labs Reviewed  CBC WITH DIFFERENTIAL/PLATELET - Abnormal; Notable for the following components:      Result Value   RBC 3.59 (*)    Hemoglobin 11.3 (*)    HCT 35.0 (*)    Lymphs Abs 0.2 (*)    Monocytes Absolute 0.0 (*)    All other components within normal limits  COMPREHENSIVE METABOLIC PANEL - Abnormal; Notable for the following components:   Glucose, Bld 145 (*)    BUN 28 (*)    Creatinine, Ser 1.27 (*)    AST 387 (*)    ALT 217 (*)    GFR, Estimated 53 (*)    All other components within normal limits  LIPASE, BLOOD - Abnormal; Notable for the following components:   Lipase 65 (*)    All other components within normal limits    EKG None  Radiology No results found.  Procedures Procedures    Medications Ordered in ED Medications  ondansetron  (ZOFRAN) injection 4 mg (4 mg Intravenous Given 07/12/22 2224)  sodium chloride 0.9 % bolus 500 mL (500 mLs Intravenous New Bag/Given 07/12/22 2224)  iohexol (OMNIPAQUE) 300 MG/ML solution 100 mL (100 mLs Intravenous Contrast Given 07/12/22 2321)    ED Course/ Medical Decision Making/ A&P                             Medical Decision Making Amount and/or Complexity of Data Reviewed Labs: ordered. Radiology: ordered.  Risk Prescription drug management.   This patient presents to the ED for concern of abd pain, this involves an extensive number of treatment options, and is a complaint that carries with it a high risk of complications and morbidity.  The differential diagnosis includes pancreatitis, cholecystitis, cholelithiasis, gastritis   Co morbidities that complicate the patient evaluation  pvcs, hld, htn, prostate cancer and anemia   Additional history obtained:  Additional history obtained from epic chart review External records from outside source obtained and reviewed including family   Lab Tests:  I Ordered, and personally interpreted labs.  The pertinent results include:  cbc nl other than chronic anemia, cmp with lfts slightly elevated, lipase slightly elevated   Imaging Studies ordered:  I ordered imaging studies including ct abd/pelvis  Result pending at shift change   Cardiac Monitoring:  The patient was maintained on a cardiac monitor.  I personally viewed and interpreted the cardiac monitored which showed an underlying rhythm of: nsr   Medicines ordered and prescription drug management:  I ordered medication including ivfs/zofran  for sx  Reevaluation of the patient after these medicines showed that the patient improved I have reviewed the patients home medicines and have made adjustments as needed   Test Considered:  ct  Problem List / ED Course:  Abd pain:  ct pending.  Pt signed out at shift change.   Reevaluation:  After the  interventions noted above, I reevaluated the patient and found that they have :improved   Social Determinants of Health:  Lives at home   Dispostion: Pending at shift change.        Final Clinical Impression(s) / ED Diagnoses Final diagnoses:  Epigastric pain    Rx / DC Orders ED Discharge Orders     None         Jacalyn Lefevre,  MD 07/12/22 2352

## 2022-07-12 NOTE — ED Triage Notes (Signed)
Pt from home with abd pain and nausea, pt has not vomited, but feels nauseated. Pt says bowel movement today was normal. Pt says upper abd feels sore right now.

## 2022-07-13 ENCOUNTER — Inpatient Hospital Stay (HOSPITAL_COMMUNITY): Payer: Medicare HMO

## 2022-07-13 DIAGNOSIS — D72829 Elevated white blood cell count, unspecified: Secondary | ICD-10-CM | POA: Diagnosis not present

## 2022-07-13 DIAGNOSIS — R7401 Elevation of levels of liver transaminase levels: Secondary | ICD-10-CM | POA: Diagnosis not present

## 2022-07-13 DIAGNOSIS — R1013 Epigastric pain: Secondary | ICD-10-CM | POA: Diagnosis not present

## 2022-07-13 DIAGNOSIS — N1831 Chronic kidney disease, stage 3a: Secondary | ICD-10-CM | POA: Diagnosis not present

## 2022-07-13 DIAGNOSIS — R748 Abnormal levels of other serum enzymes: Secondary | ICD-10-CM | POA: Diagnosis not present

## 2022-07-13 DIAGNOSIS — R1011 Right upper quadrant pain: Secondary | ICD-10-CM | POA: Diagnosis not present

## 2022-07-13 DIAGNOSIS — E782 Mixed hyperlipidemia: Secondary | ICD-10-CM

## 2022-07-13 DIAGNOSIS — I129 Hypertensive chronic kidney disease with stage 1 through stage 4 chronic kidney disease, or unspecified chronic kidney disease: Secondary | ICD-10-CM | POA: Diagnosis not present

## 2022-07-13 DIAGNOSIS — R1010 Upper abdominal pain, unspecified: Secondary | ICD-10-CM | POA: Diagnosis not present

## 2022-07-13 DIAGNOSIS — I700 Atherosclerosis of aorta: Secondary | ICD-10-CM | POA: Diagnosis not present

## 2022-07-13 DIAGNOSIS — Z888 Allergy status to other drugs, medicaments and biological substances status: Secondary | ICD-10-CM | POA: Diagnosis not present

## 2022-07-13 DIAGNOSIS — I10 Essential (primary) hypertension: Secondary | ICD-10-CM

## 2022-07-13 DIAGNOSIS — Z8249 Family history of ischemic heart disease and other diseases of the circulatory system: Secondary | ICD-10-CM | POA: Diagnosis not present

## 2022-07-13 DIAGNOSIS — N289 Disorder of kidney and ureter, unspecified: Secondary | ICD-10-CM | POA: Diagnosis not present

## 2022-07-13 DIAGNOSIS — Z79899 Other long term (current) drug therapy: Secondary | ICD-10-CM | POA: Diagnosis not present

## 2022-07-13 DIAGNOSIS — Z7982 Long term (current) use of aspirin: Secondary | ICD-10-CM | POA: Diagnosis not present

## 2022-07-13 DIAGNOSIS — R7881 Bacteremia: Secondary | ICD-10-CM | POA: Diagnosis not present

## 2022-07-13 DIAGNOSIS — Z8042 Family history of malignant neoplasm of prostate: Secondary | ICD-10-CM | POA: Diagnosis not present

## 2022-07-13 DIAGNOSIS — K828 Other specified diseases of gallbladder: Secondary | ICD-10-CM | POA: Diagnosis present

## 2022-07-13 DIAGNOSIS — R109 Unspecified abdominal pain: Secondary | ICD-10-CM | POA: Diagnosis not present

## 2022-07-13 DIAGNOSIS — R110 Nausea: Secondary | ICD-10-CM | POA: Diagnosis not present

## 2022-07-13 DIAGNOSIS — R932 Abnormal findings on diagnostic imaging of liver and biliary tract: Secondary | ICD-10-CM | POA: Diagnosis not present

## 2022-07-13 DIAGNOSIS — R945 Abnormal results of liver function studies: Secondary | ICD-10-CM | POA: Diagnosis not present

## 2022-07-13 DIAGNOSIS — K8070 Calculus of gallbladder and bile duct without cholecystitis without obstruction: Secondary | ICD-10-CM | POA: Diagnosis not present

## 2022-07-13 DIAGNOSIS — B961 Klebsiella pneumoniae [K. pneumoniae] as the cause of diseases classified elsewhere: Secondary | ICD-10-CM | POA: Diagnosis not present

## 2022-07-13 DIAGNOSIS — M8448XA Pathological fracture, other site, initial encounter for fracture: Secondary | ICD-10-CM | POA: Diagnosis not present

## 2022-07-13 DIAGNOSIS — K8050 Calculus of bile duct without cholangitis or cholecystitis without obstruction: Secondary | ICD-10-CM | POA: Diagnosis not present

## 2022-07-13 DIAGNOSIS — Z8546 Personal history of malignant neoplasm of prostate: Secondary | ICD-10-CM | POA: Diagnosis not present

## 2022-07-13 DIAGNOSIS — Q444 Choledochal cyst: Secondary | ICD-10-CM | POA: Diagnosis not present

## 2022-07-13 DIAGNOSIS — K8030 Calculus of bile duct with cholangitis, unspecified, without obstruction: Secondary | ICD-10-CM | POA: Diagnosis not present

## 2022-07-13 DIAGNOSIS — N281 Cyst of kidney, acquired: Secondary | ICD-10-CM | POA: Diagnosis not present

## 2022-07-13 DIAGNOSIS — N2889 Other specified disorders of kidney and ureter: Secondary | ICD-10-CM | POA: Diagnosis not present

## 2022-07-13 LAB — GAMMA GT: GGT: 228 U/L — ABNORMAL HIGH (ref 7–50)

## 2022-07-13 LAB — URINALYSIS, ROUTINE W REFLEX MICROSCOPIC
Bacteria, UA: NONE SEEN
Bilirubin Urine: NEGATIVE
Glucose, UA: NEGATIVE mg/dL
Hgb urine dipstick: NEGATIVE
Ketones, ur: NEGATIVE mg/dL
Leukocytes,Ua: NEGATIVE
Nitrite: NEGATIVE
Protein, ur: 30 mg/dL — AB
Specific Gravity, Urine: 1.02 (ref 1.005–1.030)
pH: 5 (ref 5.0–8.0)

## 2022-07-13 MED ORDER — HEPARIN SODIUM (PORCINE) 5000 UNIT/ML IJ SOLN
5000.0000 [IU] | Freq: Three times a day (TID) | INTRAMUSCULAR | Status: DC
  Administered 2022-07-13 – 2022-07-18 (×15): 5000 [IU] via SUBCUTANEOUS
  Filled 2022-07-13 (×17): qty 1

## 2022-07-13 MED ORDER — ONDANSETRON HCL 4 MG PO TABS: 4.0000 mg | ORAL_TABLET | Freq: Four times a day (QID) | ORAL | Status: DC | PRN

## 2022-07-13 MED ORDER — AMLODIPINE BESYLATE 10 MG PO TABS
10.0000 mg | ORAL_TABLET | Freq: Every day | ORAL | Status: DC
  Administered 2022-07-14 – 2022-07-18 (×5): 10 mg via ORAL
  Filled 2022-07-13 (×5): qty 1

## 2022-07-13 MED ORDER — ONDANSETRON HCL 4 MG/2ML IJ SOLN: 4.0000 mg | Freq: Four times a day (QID) | INTRAMUSCULAR | Status: DC | PRN

## 2022-07-13 MED ORDER — GADOBUTROL 1 MMOL/ML IV SOLN
7.0000 mL | Freq: Once | INTRAVENOUS | Status: AC | PRN
  Administered 2022-07-13: 7 mL via INTRAVENOUS

## 2022-07-13 MED ORDER — BENAZEPRIL HCL 20 MG PO TABS
20.0000 mg | ORAL_TABLET | Freq: Every day | ORAL | Status: DC
  Administered 2022-07-14 – 2022-07-18 (×5): 20 mg via ORAL
  Filled 2022-07-13 (×6): qty 1

## 2022-07-13 NOTE — Progress Notes (Signed)
Brief progress note: -Admitted earlier today with abnormal liver function tests and imaging studies. -CT also concerning for possible renal neoplasms. -Awaiting MRCP. -Will check GGT. -Daily history from patient was epigastric pain.  No associated nausea or vomiting according to the patient.  As per H&P done earlier today: "Benjamin Buchanan is a 89 y.o. male with medical history significant of hypertension, hyperlipidemia who presents to the emergency department due to abdominal pain which started yesterday.  Abdominal pain was in epigastric and right upper quadrant area.  This was associated with nausea.  Patient denies chest pain, shortness of breath, vomiting, diarrhea.  Patient was unable to provide further history.  He states that he lives alone.   ED Course:  In the emergency department, temperature was 37.5 F, BP 177/77, other vital signs were within normal range.  Workup in the ED showed normal CBC except for hemoglobin of 11.3 and hematocrit of 35.0.  BMP was normal except for blood glucose of 145, BUN 28, creatinine 1.27, AST 387, ALT 217, ALP 98, lipase 65, urinalysis was normal. CT abdomen and pelvis with contrast showed: Focally dilated extrahepatic main portal vein measuring 3.8 x 4.6 cm in diameter spanning a length of 5.2 cm. The nominal diameter of the portal vein is 1.7 cm proximal to the area of focal dilation. No intraluminal thrombus or perivascular inflammatory stranding or fluid collection identified. 3. Mild gallbladder distension and intrahepatic biliary ductal dilation of the level of the dilated main portal vein which demonstrates mass effect upon the proximal extrahepatic bile duct. The distal extrahepatic bile duct is of normal caliber. Zofran was given, IV NS was provided.  Gastroenterologist was consulted and recommended admitting patient to [facility] for MRCP.  Hospitalist was asked to admit patient for further evaluation and management".  07/13/2022: Follow MRCP  abdomen.  Depending on results of the MRI abdomen, possible renal neoplasm may be worked up further i.e. depending on goals of care.

## 2022-07-13 NOTE — Progress Notes (Signed)
Pt arrived to unit at approx 0400 via ambulance on stretcher. VS: BP 137/67, T 98.3, P109, and RR 19. Pt oriented to room. Call bell and hydration at bedside. Pt care to continue.

## 2022-07-13 NOTE — ED Notes (Signed)
[facility] departed with pt at this time ?

## 2022-07-13 NOTE — ED Provider Notes (Signed)
  Provider Note MRN:  [id number]  Arrival date & time: 07/13/22    ED Course and Medical Decision Making  Assumed care from Dr. Particia Nearing at shift change.  Epigastric pain awaiting CT.  LFTs are elevated.  CT scan revealing possible biliary obstruction, which would correlate with LFTs.  Case discussed with hospitalist, will reach out to GI and to determine appropriateness of patient transfer for possible ERCP or MRCP.  Procedures  Final Clinical Impressions(s) / ED Diagnoses     ICD-10-CM   1. Epigastric pain  R10.13       ED Discharge Orders     None       Discharge Instructions   None     Elmer Sow. Pilar Plate, MD [facility] [email or web address]    Sabas Sous, MD 07/13/22 [phone]

## 2022-07-13 NOTE — Consult Note (Signed)
[facility] Consult  Referring Provider: [facility] hospital[facility] Primary Care Physician:  [name], FNP Primary Gastroenterologist: Dr. Madilyn Fireman in 2012  Reason for Consultation: Abnormal LFTs  HPI: Benjamin Buchanan is a 89 y.o. male presented to the ER at [facility] with sudden onset of lower sternal, epigastric abdominal pain associated with nausea and 1 episode of dry heaving.  Patient states this was not associated with any fever.  No prior similar episode. He denies history of acid reflux, heartburn, difficulty swallowing or pain on swallowing.  He is hard of hearing but seems to be oriented and states he lives by himself.  He has not had any change in bowel habits, denies noticing blood in stool or black stools.  States he has a good appetite and denies unintentional weight loss.  Denies current abdominal pain. In the ED he had a CAT scan which showed possible biliary obstruction with elevated LFTs and was transferred from [facility] to [facility] for MRCP.  Prior GI workup: Colonoscopy, 2012, Dr. Madilyn Fireman, surveillance: 1 tubular adenoma removed from transverse colon, sigmoid diverticulosis Colonoscopy, 2007, Dr. Madilyn Fireman: 1 tubular adenoma removed from ascending colon Colonoscopy, 2004, Dr. Madilyn Fireman, screening: 1 cecal tubular adenoma removed at   Past Medical History:  Diagnosis Date   Anemia    History of prostate cancer 1994   HLD (hyperlipidemia)    HTN (hypertension)    PVC (premature ventricular contraction)    history of    Past Surgical History:  Procedure Laterality Date   HERNIA REPAIR     INGUINAL HERNIA REPAIR Left 10/23/2016   Procedure: LEFT INGUINAL HERNIA REPAIR WITH MESH  ;  Surgeon: Emelia Loron, MD;  Location: [facility];  Service: General;  Laterality: Left;   INSERTION OF MESH Left 10/23/2016   Procedure: INSERTION OF MESH;  Surgeon: Emelia Loron, MD;  Location: [facility];  Service: General;  Laterality:  Left;   LUMBAR LAMINECTOMY/DECOMPRESSION MICRODISCECTOMY N/A 02/10/2018   Procedure: L2-3, L3-4 and L4-5 complete decompressive lumbar laminectomies for spinal stenosis, Foraminotomies  for L3, L4, L5 nerve roots.;  Surgeon: Ranee Gosselin, MD;  Location: [facility];  Service: Orthopedics;  Laterality: N/A;    PROSTATE SURGERY      Prior to Admission medications   Medication Sig Start Date End Date Taking? Authorizing Provider  acetaminophen (TYLENOL) 325 MG tablet Take 325 mg by mouth 2 (two) times daily as needed for moderate pain.    [provider]  amLODipine (NORVASC) 10 MG tablet TAKE 1 TABLET BY MOUTH EVERY DAY 06/23/22   [name] A, FNP  aspirin 81 MG tablet Take 81 mg by mouth every Monday, Wednesday, and Friday.     [provider]  benazepril (LOTENSIN) 20 MG tablet TAKE 1 TABLET BY MOUTH EVERY DAY 05/23/22   [name] A, FNP  Cholecalciferol 1000 UNITS capsule Take 1,000 Units by mouth daily.    [provider]  Omega-3 Fatty Acids (FISH OIL PO) Take by mouth.    [provider]  ONETOUCH ULTRA test strip CHECK BLOOD SUGAR EVERY DAY AND AS NEEDED 09/20/21   [name] A, FNP  rosuvastatin (CRESTOR) 5 MG tablet TAKE 1 TABLET BY MOUTH EVERY DAY IN THE EVENING 05/23/22   [name] A, FNP  vitamin C (ASCORBIC ACID) 500 MG tablet Take 500 mg by mouth 2 (two) times daily.     [provider]    Current Facility-Administered Medications  Medication Dose Route  Frequency Provider Last Rate Last Admin   amLODipine (NORVASC) tablet 10 mg  10 mg Oral Daily Adefeso, Oladapo, DO       benazepril (LOTENSIN) tablet 20 mg  20 mg Oral Daily Adefeso, Oladapo, DO       heparin injection 5,000 Units  5,000 Units Subcutaneous Q8H Adefeso, Oladapo, DO   5,000 Units at 07/13/22 0628   ondansetron (ZOFRAN) tablet 4 mg  4 mg Oral Q6H PRN Frankey Shown, DO       Or   ondansetron (ZOFRAN) injection 4 mg  4 mg Intravenous Q6H PRN Adefeso,  Oladapo, DO        Allergies as of 07/12/2022 - Review Complete 07/12/2022  Allergen Reaction Noted   Lipitor [atorvastatin] Other (See Comments) 09/06/2012    Family History  Problem Relation Age of Onset   Cancer Father        prostate cancer   Hypertension Other        family history    Social History   Socioeconomic History   Marital status: Widowed    Spouse name: Not on file   Number of children: 2   Years of education: 10   Highest education level: 10th grade  Occupational History   Occupation: retired  Tobacco Use   Smoking status: Never   Smokeless tobacco: Never  Vaping Use   Vaping Use: Never used  Substance and Sexual Activity   Alcohol use: No   Drug use: No   Sexual activity: Not Currently  Other Topics Concern   Not on file  Social History Narrative   Retired. Lives alone. No family nearby, but he eats out for breakfast and dinner with friends several times per week. Stays active. Still very independent 11/2020   Social Determinants of Health   Financial Resource Strain: Low Risk  (01/27/2022)   Overall Financial Resource Strain (CARDIA)    Difficulty of Paying Living Expenses: Not hard at all  Food Insecurity: No Food Insecurity (07/13/2022)   Hunger Vital Sign    Worried About Running Out of Food in the Last Year: Never true    Ran Out of Food in the Last Year: Never true  Transportation Needs: No Transportation Needs (07/13/2022)   PRAPARE - Administrator, Civil Service (Medical): No    Lack of Transportation (Non-Medical): No  Physical Activity: Inactive (01/27/2022)   Exercise Vital Sign    Days of Exercise per Week: 0 days    Minutes of Exercise per Session: 0 min  Stress: No Stress Concern Present (01/27/2022)   [employer] of Occupational Health - Occupational Stress Questionnaire    Feeling of Stress : Not at all  Social Connections: Moderately Isolated (01/27/2022)   Social Connection and Isolation Panel [NHANES]     Frequency of Communication with Friends and Family: More than three times a week    Frequency of Social Gatherings with Friends and Family: Three times a week    Attends Religious Services: Never    Active Member of Clubs or Organizations: Yes    Attends Banker Meetings: More than 4 times per year    Marital Status: Widowed  Intimate Partner Violence: Not At Risk (07/13/2022)   Humiliation, Afraid, Rape, and Kick questionnaire    Fear of Current or Ex-Partner: No    Emotionally Abused: No    Physically Abused: No    Sexually Abused: No    Review of Systems: As per HPI  Physical Exam: Vital  signs in last 24 hours: Temp:  [97.5 F (36.4 C)-98.3 F (36.8 C)] 97.8 F (36.6 C) (06/30 0807) Pulse Rate:  [72-109] 103 (06/30 0807) Resp:  [17-25] 17 (06/30 0807) BP: (100-177)/(51-77) 124/59 (06/30 0807) SpO2:  [91 %-97 %] 97 % (06/30 0807) Weight:  [61.2 kg] 61.2 kg (06/29 2155)    General:   Alert,  Well-developed, well-nourished, pleasant and cooperative in NAD Head:  Normocephalic and atraumatic. Eyes:  Sclera clear, no icterus.   Conjunctiva pink. Ears: Hard of hearing  Nose:  No deformity, discharge,  or lesions. Mouth:  No deformity or lesions.  Oropharynx pink & moist. Neck:  Supple; no masses or thyromegaly. Lungs:  Clear throughout to auscultation.   No wheezes, crackles, or rhonchi. No acute distress. Heart:  Regular rate and rhythm; no murmurs, clicks, rubs,  or gallops. Extremities:  Without clubbing or edema. Neurologic:  Alert and  oriented x4;  grossly normal neurologically. Skin:  Intact without significant lesions or rashes. Psych:  Alert and cooperative. Normal mood and affect. Abdomen:  Soft, nontender and nondistended. No masses, hepatosplenomegaly or hernias noted. Normal bowel sounds, without guarding, and without rebound.         Lab Results: Recent Labs    07/12/22 2226  WBC 7.4  HGB 11.3*  HCT 35.0*  PLT 246   BMET Recent Labs     07/12/22 2226  NA 140  K 4.1  CL 110  CO2 22  GLUCOSE 145*  BUN 28*  CREATININE 1.27*  CALCIUM 8.9   LFT Recent Labs    07/12/22 2226  PROT 6.7  ALBUMIN 3.6  AST 387*  ALT 217*  ALKPHOS 98  BILITOT 1.1   PT/INR No results for input(s): "LABPROT", "INR" in the last 72 hours.  Studies/Results: CT ABDOMEN PELVIS W CONTRAST  Result Date: 07/13/2022 CLINICAL DATA:  Abdominal pain, acute, nonlocalized EXAM: CT ABDOMEN AND PELVIS WITH CONTRAST TECHNIQUE: Multidetector CT imaging of the abdomen and pelvis was performed using the standard protocol following bolus administration of intravenous contrast. RADIATION DOSE REDUCTION: This exam was performed according to the departmental dose-optimization program which includes automated exposure control, adjustment of the mA and/or kV according to patient size and/or use of iterative reconstruction technique. CONTRAST:  OMNIPAQUE IOHEXOL 300 MG/ML  SOLN COMPARISON:  None Available. FINDINGS: Lower chest: Extensive multi-vessel coronary artery calcification noted. Global cardiac size within normal limits. No pericardial effusion. Visualized lung bases are clear. Hepatobiliary: Tiny scattered hepatic hypodensities are seen throughout the liver which are too small to accurately characterize but may represent tiny hepatic cyst or biliary hamartoma. No enhancing intrahepatic mass identified. Mild periportal edema. Mild intrahepatic biliary ductal dilation of the level of the dilated portal vein which demonstrates mass effect upon the proximal extrahepatic bile duct. The distal extrahepatic bile duct is of normal caliber. The gallbladder is distended, however, no superimposed pericholecystic inflammatory changes are identified. The extrahepatic main portal vein is markedly focally dilated, measuring 3.8 x 4.6 cm in diameter spanning a length of 5.2 cm. The nominal diameter of the portal vein is 1.7 cm proximal to the area of focal dilation. No  intraluminal thrombus or perivascular inflammatory stranding or fluid collection identified. Pancreas: Unremarkable Spleen: Unremarkable Adrenals/Urinary Tract: The adrenal glands are unremarkable. The kidneys are normal in size and position. Multiple simple cortical cysts are seen within the kidneys bilaterally for which no follow-up imaging is recommended. However, and indeterminate exophytic 13 mm enhancing mass is seen arising from the posterior,  interpolar region of the right kidney at axial image # 38/2 and a a 13 mm exophytic enhancing lesion is seen arising anteriorly from the lower pole the right kidney at axial image # 39/2 compatible with multiple small exophytic renal neoplasm. A indeterminate exophytic lesion is seen arising from the lower pole of the left kidney at axial image # 42/2. No hydronephrosis. No intrarenal or ureteral calculi. The bladder is unremarkable. Stomach/Bowel: Stomach is within normal limits. Appendix appears normal. No evidence of bowel wall thickening, distention, or inflammatory changes. Vascular/Lymphatic: Moderate aortoiliac atherosclerotic calcification. No aortic aneurysm. No pathologic adenopathy within the abdomen and pelvis. Reproductive: Prostatectomy has been performed. Other: No abdominal wall hernia. Musculoskeletal: The osseous structures are age-appropriate. Bilateral laminectomy and resection of the spinous processes of L2 and L3 has been performed. Large calcified posterior disc herniations at these levels resultant severe canal stenosis. No acute bone abnormality. No lytic or blastic bone lesion. IMPRESSION: 1. Extensive multi-vessel coronary artery calcification. 2. Focally dilated extrahepatic main portal vein measuring 3.8 x 4.6 cm in diameter spanning a length of 5.2 cm. The nominal diameter of the portal vein is 1.7 cm proximal to the area of focal dilation. No intraluminal thrombus or perivascular inflammatory stranding or fluid collection identified. 3.  Mild gallbladder distension and intrahepatic biliary ductal dilation of the level of the dilated main portal vein which demonstrates mass effect upon the proximal extrahepatic bile duct. The distal extrahepatic bile duct is of normal caliber. Correlation with liver enzymes may be helpful to exclude an obstructive process. 4. Multiple exophytic enhancing masses arising from the lower pole the left kidney suspicious for multiple renal neoplasms. If indicated, dedicated renal mass protocol CT or MRI examination is recommended for further evaluation. 5. Status post prostatectomy. 6. Status post bilateral laminectomy and resection of the spinous processes of L2 and L3. Large calcified posterior disc herniations at these levels resultant severe canal stenosis. Aortic Atherosclerosis (ICD10-I70.0). Electronically Signed   By: Helyn Numbers M.D.   On: 07/13/2022 00:00    Impression: Epigastric, lower sternal pain associated with nausea and dry heaving Abnormal LFTs, T. bili 1.1/AST 387/ALT 217/ALP 98 with minimally elevated lipase of 65 CT showed  focally dilated extrahepatic main portal vein measuring 3.8 x 4.6 cm spanning the length of 5.2 cm without intraluminal thrombus or perivascular inflammatory stranding of fluid collection Mildly distended gallbladder and intrahepatic biliary ductal dilatation to the level of dilated main portal vein   Multiple renal neoplasms impaired kidney function, BUN 28, creatinine 1.27, GFR 53 Normal WBC, hemoglobin 11.3  Plan: MRI, MRCP has been ordered by primary team, results pending Currently on antiemetics and clear liquid diet. Will follow results of MRCP, Dr. Dulce Sellar to follow tomorrow.   LOS: 0 days   Kerin Salen, MD  07/13/2022, 12:23 PM

## 2022-07-13 NOTE — H&P (Signed)
History and Physical    Patient: Benjamin Buchanan ZDG:[id number] DOB: Jan 01, 1929 DOA: 07/12/2022 DOS: the patient was seen and examined on 07/13/2022 PCP: [name], FNP  Patient coming from: Home  Chief Complaint:  Chief Complaint  Patient presents with   Abdominal Pain   HPI: Benjamin Buchanan is a 89 y.o. male with medical history significant of hypertension, hyperlipidemia who presents to the emergency department due to abdominal pain which started yesterday.  Abdominal pain was in epigastric and right upper quadrant area.  This was associated with nausea.  Patient denies chest pain, shortness of breath, vomiting, diarrhea.  Patient was unable to provide further history.  He states that he lives alone.  ED Course:  In the emergency department, temperature was 37.5 F, BP 177/77, other vital signs were within normal range.  Workup in the ED showed normal CBC except for hemoglobin of 11.3 and hematocrit of 35.0.  BMP was normal except for blood glucose of 145, BUN 28, creatinine 1.27, AST 387, ALT 217, ALP 98, lipase 65, urinalysis was normal. CT abdomen and pelvis with contrast showed: Focally dilated extrahepatic main portal vein measuring 3.8 x 4.6 cm in diameter spanning a length of 5.2 cm. The nominal diameter of the portal vein is 1.7 cm proximal to the area of focal dilation. No intraluminal thrombus or perivascular inflammatory stranding or fluid collection identified. 3. Mild gallbladder distension and intrahepatic biliary ductal dilation of the level of the dilated main portal vein which demonstrates mass effect upon the proximal extrahepatic bile duct. The distal extrahepatic bile duct is of normal caliber. Zofran was given, IV NS was provided.  Gastroenterologist was consulted and recommended admitting patient to [facility] for MRCP.  Hospitalist was asked to admit patient for further evaluation and management.  Review of Systems: Review of systems as noted in the  HPI. All other systems reviewed and are negative.   Past Medical History:  Diagnosis Date   Anemia    History of prostate cancer 1994   HLD (hyperlipidemia)    HTN (hypertension)    PVC (premature ventricular contraction)    history of   Past Surgical History:  Procedure Laterality Date   HERNIA REPAIR     INGUINAL HERNIA REPAIR Left 10/23/2016   Procedure: LEFT INGUINAL HERNIA REPAIR WITH MESH  ;  Surgeon: Emelia Loron, MD;  Location: [facility];  Service: General;  Laterality: Left;   INSERTION OF MESH Left 10/23/2016   Procedure: INSERTION OF MESH;  Surgeon: Emelia Loron, MD;  Location: [facility];  Service: General;  Laterality: Left;   LUMBAR LAMINECTOMY/DECOMPRESSION MICRODISCECTOMY N/A 02/10/2018   Procedure: L2-3, L3-4 and L4-5 complete decompressive lumbar laminectomies for spinal stenosis, Foraminotomies  for L3, L4, L5 nerve roots.;  Surgeon: Ranee Gosselin, MD;  Location: [facility];  Service: Orthopedics;  Laterality: N/A;    PROSTATE SURGERY      Social History:  reports that he has never smoked. He has never used smokeless tobacco. He reports that he does not drink alcohol and does not use drugs.   Allergies  Allergen Reactions   Lipitor [Atorvastatin] Other (See Comments)    myalgia    Family History  Problem Relation Age of Onset   Cancer Father        prostate cancer   Hypertension Other        family history     Prior to Admission medications   Medication Sig Start Date End Date  Taking? Authorizing Provider  acetaminophen (TYLENOL) 325 MG tablet Take 325 mg by mouth 2 (two) times daily as needed for moderate pain.    [provider]  amLODipine (NORVASC) 10 MG tablet TAKE 1 TABLET BY MOUTH EVERY DAY 06/23/22   [name] A, FNP  aspirin 81 MG tablet Take 81 mg by mouth every Monday, Wednesday, and Friday.     [provider]  benazepril (LOTENSIN) 20 MG tablet TAKE 1 TABLET BY MOUTH  EVERY DAY 05/23/22   [name] A, FNP  Cholecalciferol 1000 UNITS capsule Take 1,000 Units by mouth daily.    [provider]  Omega-3 Fatty Acids (FISH OIL PO) Take by mouth.    [provider]  ONETOUCH ULTRA test strip CHECK BLOOD SUGAR EVERY DAY AND AS NEEDED 09/20/21   [name] A, FNP  rosuvastatin (CRESTOR) 5 MG tablet TAKE 1 TABLET BY MOUTH EVERY DAY IN THE EVENING 05/23/22   [name] A, FNP  vitamin C (ASCORBIC ACID) 500 MG tablet Take 500 mg by mouth 2 (two) times daily.     [provider]    Physical Exam: BP (!) 124/57   Pulse 93   Temp (!) 97.5 F (36.4 C) (Oral)   Resp (!) 25   Ht 5\' 10"  (1.778 m)   Wt 61.2 kg   SpO2 95%   BMI 19.37 kg/m   General: 89 y.o. year-old male well developed well nourished in no acute distress.  Alert and oriented x3. HEENT: NCAT, EOMI Neck: Supple, trachea medial Cardiovascular: Regular rate and rhythm with no rubs or gallops.  No thyromegaly or JVD noted.  No lower extremity edema. 2/4 pulses in all 4 extremities. Respiratory: Clear to auscultation with no wheezes or rales. Good inspiratory effort. Abdomen: Soft, tender to palpation in RUQ and epigastric area.  Nondistended with normal bowel sounds x4 quadrants. Muskuloskeletal: No cyanosis, clubbing or edema noted bilaterally Neuro: CN II-XII intact, strength 5/5 x 4, sensation, reflexes intact Skin: No ulcerative lesions noted or rashes Psychiatry:  Mood is appropriate for condition and setting          Labs on Admission:  Basic Metabolic Panel: Recent Labs  Lab 07/12/22 2226  NA 140  K 4.1  CL 110  CO2 22  GLUCOSE 145*  BUN 28*  CREATININE 1.27*  CALCIUM 8.9   Liver Function Tests: Recent Labs  Lab 07/12/22 2226  AST 387*  ALT 217*  ALKPHOS 98  BILITOT 1.1  PROT 6.7  ALBUMIN 3.6   Recent Labs  Lab 07/12/22 2226  LIPASE 65*   No results for input(s): "AMMONIA" in the last 168 hours. CBC: Recent Labs  Lab  07/12/22 2226  WBC 7.4  NEUTROABS 7.0  HGB 11.3*  HCT 35.0*  MCV 97.5  PLT 246   Cardiac Enzymes: No results for input(s): "CKTOTAL", "CKMB", "CKMBINDEX", "TROPONINI" in the last 168 hours.  BNP (last 3 results) No results for input(s): "BNP" in the last 8760 hours.  ProBNP (last 3 results) No results for input(s): "PROBNP" in the last 8760 hours.  CBG: No results for input(s): "GLUCAP" in the last 168 hours.  Radiological Exams on Admission: CT ABDOMEN PELVIS W CONTRAST  Result Date: 07/13/2022 CLINICAL DATA:  Abdominal pain, acute, nonlocalized EXAM: CT ABDOMEN AND PELVIS WITH CONTRAST TECHNIQUE: Multidetector CT imaging of the abdomen and pelvis was performed using the standard protocol following bolus administration of intravenous contrast. RADIATION DOSE REDUCTION: This exam was performed according to the  departmental dose-optimization program which includes automated exposure control, adjustment of the mA and/or kV according to patient size and/or use of iterative reconstruction technique. CONTRAST:  OMNIPAQUE IOHEXOL 300 MG/ML  SOLN COMPARISON:  None Available. FINDINGS: Lower chest: Extensive multi-vessel coronary artery calcification noted. Global cardiac size within normal limits. No pericardial effusion. Visualized lung bases are clear. Hepatobiliary: Tiny scattered hepatic hypodensities are seen throughout the liver which are too small to accurately characterize but may represent tiny hepatic cyst or biliary hamartoma. No enhancing intrahepatic mass identified. Mild periportal edema. Mild intrahepatic biliary ductal dilation of the level of the dilated portal vein which demonstrates mass effect upon the proximal extrahepatic bile duct. The distal extrahepatic bile duct is of normal caliber. The gallbladder is distended, however, no superimposed pericholecystic inflammatory changes are identified. The extrahepatic main portal vein is markedly focally dilated, measuring 3.8 x  4.6 cm in diameter spanning a length of 5.2 cm. The nominal diameter of the portal vein is 1.7 cm proximal to the area of focal dilation. No intraluminal thrombus or perivascular inflammatory stranding or fluid collection identified. Pancreas: Unremarkable Spleen: Unremarkable Adrenals/Urinary Tract: The adrenal glands are unremarkable. The kidneys are normal in size and position. Multiple simple cortical cysts are seen within the kidneys bilaterally for which no follow-up imaging is recommended. However, and indeterminate exophytic 13 mm enhancing mass is seen arising from the posterior, interpolar region of the right kidney at axial image # 38/2 and a a 13 mm exophytic enhancing lesion is seen arising anteriorly from the lower pole the right kidney at axial image # 39/2 compatible with multiple small exophytic renal neoplasm. A indeterminate exophytic lesion is seen arising from the lower pole of the left kidney at axial image # 42/2. No hydronephrosis. No intrarenal or ureteral calculi. The bladder is unremarkable. Stomach/Bowel: Stomach is within normal limits. Appendix appears normal. No evidence of bowel wall thickening, distention, or inflammatory changes. Vascular/Lymphatic: Moderate aortoiliac atherosclerotic calcification. No aortic aneurysm. No pathologic adenopathy within the abdomen and pelvis. Reproductive: Prostatectomy has been performed. Other: No abdominal wall hernia. Musculoskeletal: The osseous structures are age-appropriate. Bilateral laminectomy and resection of the spinous processes of L2 and L3 has been performed. Large calcified posterior disc herniations at these levels resultant severe canal stenosis. No acute bone abnormality. No lytic or blastic bone lesion. IMPRESSION: 1. Extensive multi-vessel coronary artery calcification. 2. Focally dilated extrahepatic main portal vein measuring 3.8 x 4.6 cm in diameter spanning a length of 5.2 cm. The nominal diameter of the portal vein is 1.7 cm  proximal to the area of focal dilation. No intraluminal thrombus or perivascular inflammatory stranding or fluid collection identified. 3. Mild gallbladder distension and intrahepatic biliary ductal dilation of the level of the dilated main portal vein which demonstrates mass effect upon the proximal extrahepatic bile duct. The distal extrahepatic bile duct is of normal caliber. Correlation with liver enzymes may be helpful to exclude an obstructive process. 4. Multiple exophytic enhancing masses arising from the lower pole the left kidney suspicious for multiple renal neoplasms. If indicated, dedicated renal mass protocol CT or MRI examination is recommended for further evaluation. 5. Status post prostatectomy. 6. Status post bilateral laminectomy and resection of the spinous processes of L2 and L3. Large calcified posterior disc herniations at these levels resultant severe canal stenosis. Aortic Atherosclerosis (ICD10-I70.0). Electronically Signed   By: Helyn Numbers M.D.   On: 07/13/2022 00:00    EKG: I independently viewed the EKG done and my findings are  as followed: Ectopic atrial rhythm at a rate of 98 bpm  Assessment/Plan Present on Admission:  Abdominal pain  Essential hypertension  Mixed hyperlipidemia  Principal Problem:   Abdominal pain Active Problems:   Essential hypertension   Mixed hyperlipidemia   Nausea   Transaminitis   Elevated lipase  Abdominal pain Nausea without vomiting Continue IV morphine 2 mg q.4h p.r.n. for moderate to severe pain Continue IV Zofran p.r.n. Continue clear liquid diet with plan to advanced diet as tolerated Gastroenterologist here at [facility] was consulted and recommended admitting patient to [facility] for MRCP per ED  Consider gastroenterology consult based on MRCP findings.  Transaminitis/Elevated Lipase level AST 387, ALT 217, lipase 65 This is possibly secondary to above MRCP in the morning  Essential hypertension Continue amlodipine,  benazepril  Mixed hyperlipidemia Statin will be held at this time due to elevated liver enzymes  DVT prophylaxis: Heparin subcu  Advance Care Planning: Full code  Consults: None  Family Communication: None at bedside  Severity of Illness: The appropriate patient status for this patient is INPATIENT. Inpatient status is judged to be reasonable and necessary in order to provide the required intensity of service to ensure the patient's safety. The patient's presenting symptoms, physical exam findings, and initial radiographic and laboratory data in the context of their chronic comorbidities is felt to place them at high risk for further clinical deterioration. Furthermore, it is not anticipated that the patient will be medically stable for discharge from the hospital within 2 midnights of admission.   * I certify that at the point of admission it is my clinical judgment that the patient will require inpatient hospital care spanning beyond 2 midnights from the point of admission due to high intensity of service, high risk for further deterioration and high frequency of surveillance required.*  Author: Frankey Shown, DO 07/13/2022 3:15 AM  For on call review [email or web address].

## 2022-07-14 ENCOUNTER — Inpatient Hospital Stay (HOSPITAL_COMMUNITY): Payer: Medicare HMO

## 2022-07-14 DIAGNOSIS — R1010 Upper abdominal pain, unspecified: Secondary | ICD-10-CM | POA: Diagnosis not present

## 2022-07-14 LAB — URINALYSIS, ROUTINE W REFLEX MICROSCOPIC
Bacteria, UA: NONE SEEN
Bilirubin Urine: NEGATIVE
Glucose, UA: NEGATIVE mg/dL
Hgb urine dipstick: NEGATIVE
Ketones, ur: 5 mg/dL — AB
Leukocytes,Ua: NEGATIVE
Nitrite: NEGATIVE
Protein, ur: 100 mg/dL — AB
Specific Gravity, Urine: 1.017 (ref 1.005–1.030)
pH: 5 (ref 5.0–8.0)

## 2022-07-14 LAB — COMPREHENSIVE METABOLIC PANEL
ALT: 883 U/L — ABNORMAL HIGH (ref 0–44)
AST: 504 U/L — ABNORMAL HIGH (ref 15–41)
Albumin: 2.8 g/dL — ABNORMAL LOW (ref 3.5–5.0)
Alkaline Phosphatase: 120 U/L (ref 38–126)
Anion gap: 9 (ref 5–15)
BUN: 22 mg/dL (ref 8–23)
CO2: 22 mmol/L (ref 22–32)
Calcium: 8.5 mg/dL — ABNORMAL LOW (ref 8.9–10.3)
Chloride: 111 mmol/L (ref 98–111)
Creatinine, Ser: 1.25 mg/dL — ABNORMAL HIGH (ref 0.61–1.24)
GFR, Estimated: 54 mL/min — ABNORMAL LOW (ref >60)
Glucose, Bld: 98 mg/dL (ref 70–99)
Potassium: 3.8 mmol/L (ref 3.5–5.1)
Sodium: 142 mmol/L (ref 135–145)
Total Bilirubin: 1.1 mg/dL (ref 0.3–1.2)
Total Protein: 5.6 g/dL — ABNORMAL LOW (ref 6.5–8.1)

## 2022-07-14 LAB — CBC WITH DIFFERENTIAL/PLATELET
Abs Immature Granulocytes: 0.19 10*3/uL — ABNORMAL HIGH (ref 0.00–0.07)
Basophils Absolute: 0 10*3/uL (ref 0.0–0.1)
Basophils Relative: 0 %
Eosinophils Absolute: 0 10*3/uL (ref 0.0–0.5)
Eosinophils Relative: 0 %
HCT: 32.8 % — ABNORMAL LOW (ref 39.0–52.0)
Hemoglobin: 10.8 g/dL — ABNORMAL LOW (ref 13.0–17.0)
Immature Granulocytes: 1 %
Lymphocytes Relative: 3 %
Lymphs Abs: 0.5 10*3/uL — ABNORMAL LOW (ref 0.7–4.0)
MCH: 31.5 pg (ref 26.0–34.0)
MCHC: 32.9 g/dL (ref 30.0–36.0)
MCV: 95.6 fL (ref 80.0–100.0)
Monocytes Absolute: 0.8 10*3/uL (ref 0.1–1.0)
Monocytes Relative: 4 %
Neutro Abs: 16.7 10*3/uL — ABNORMAL HIGH (ref 1.7–7.7)
Neutrophils Relative %: 92 %
Platelets: 175 10*3/uL (ref 150–400)
RBC: 3.43 MIL/uL — ABNORMAL LOW (ref 4.22–5.81)
RDW: 13.2 % (ref 11.5–15.5)
WBC: 18.2 10*3/uL — ABNORMAL HIGH (ref 4.0–10.5)
nRBC: 0 % (ref 0.0–0.2)

## 2022-07-14 LAB — EXPECTORATED SPUTUM ASSESSMENT W GRAM STAIN, RFLX TO RESP C

## 2022-07-14 LAB — CULTURE, BLOOD (ROUTINE X 2)

## 2022-07-14 LAB — PROCALCITONIN: Procalcitonin: 28.07 ng/mL

## 2022-07-14 LAB — PHOSPHORUS: Phosphorus: 2.5 mg/dL (ref 2.5–4.6)

## 2022-07-14 LAB — MAGNESIUM: Magnesium: 1.8 mg/dL (ref 1.7–2.4)

## 2022-07-14 NOTE — Plan of Care (Signed)

## 2022-07-14 NOTE — Progress Notes (Signed)
PROGRESS NOTE    Benjamin Buchanan  QMV:[id number] DOB: 1928/05/29 DOA: 07/12/2022 PCP: [name], FNP  Outpatient Specialists:     Brief Narrative:  Patient is a 89 year old Caucasian male past medical history significant for prostate cancer, hypertension, PVCs, anemia and hyperlipidemia.  Patient presented to [facility] emergency room with abdominal pain, with associated nausea and vomiting.  [facility], AST was 387, ALT of 217, alkaline phosphatase of 19, lipase of 65, BUN of 28 and serum creatinine of 1.27.  Blood pressure was also elevated.  CT scan of the abdomen and pelvis with contrast done at [facility] revealed mild gallbladder distention and intrahepatic biliary ductal dilatation and multiple exophytic enhancing masses arising from the lower pole of the left kidney suspicious for multiple renal neoplasms.  GI team at [facility] advised that patient be transferred to [facility] for MRCP of the abdomen.  MRCP of the abdomen revealed possible 3 mm distal common bile duct stone, numerous bilateral Bosniak class I and 2 kidney cysts and 3 enhancing lesions arising off the left kidney which measures up to 1.5 cm.  These lesions were said to be concerning for small renal cell carcinomas.  Signs of solid organ or nodal metastasis within the abdomen.  07/14/2022: Patient seen alongside patient's daughter.  Patient's daughter was updated extensively.  Worsening LFTs noted today, with AST of 504, ALT of 883, total protein of 5.6, total bili of 1.1.  GGT done yesterday was elevated (228).  GI team has been consulted to assist with patient's management.  Interventional radiology team was consulted for the left renal masses.  Interventional radiology has advised the patient follow-up with urology team on discharge.  The risk of bleeding with biopsy was said to be high.  Abdominal pain has resolved.  Nausea vomiting have resolved.  No new complaints  today.   Assessment & Plan:   Principal Problem:   Abdominal pain Active Problems:   Essential hypertension   Mixed hyperlipidemia   Nausea   Transaminitis   Elevated lipase   Abdominal pain: -Resolved significantly. -MRCP revealed distal common bile duct stone. -Continue supportive care. -GI input is appreciated. -Repeat LFTs in the morning.  Common bile duct stone: -Suspicion the patient may have passed a stone. -Follow LFTs in the morning.  Left renal masses: -Concerning for possible malignancy. -Follow-up with urology on discharge. -IR input is as documented above.  Elevated GGT: -Patient continues to deny alcohol use.  Elevated LFTs: -Repeat level in the morning. -GI input is appreciated. -If LFTs remain elevated, GI documented that they may consider ERCP.  Hypertension: -Continue to monitor closely.  Chronic kidney disease stage IIIa: -Stable. -Continue to monitor closely.   DVT prophylaxis: Subcutaneous heparin Code Status: Full code Family Communication: Daughter Disposition Plan: To be discharged back home.   Consultants:  GI team. Interventional radiology team. Patient will need to follow-up with urology team on discharge.  Procedures:  MRCP of abdomen and pelvis.  Antimicrobials:  None   Subjective: Abdominal pain has resolved.  Objective: Vitals:   07/13/22 1713 07/13/22 2033 07/14/22 0317 07/14/22 0851  BP: (!) 144/72 (!) 152/61 (!) 143/66 (!) 144/68  Pulse: 75 73 65 84  Resp: 17 19 19 19   Temp: 98.3 F (36.8 C) 98.5 F (36.9 C) 98 F (36.7 C) 98.4 F (36.9 C)  TempSrc: Oral Oral Oral   SpO2: 97% 96% 98% 99%  Weight:      Height:  Intake/Output Summary (Last 24 hours) at 07/14/2022 1447 Last data filed at 07/14/2022 1100 Gross per 24 hour  Intake 240 ml  Output --  Net 240 ml   Filed Weights   07/12/22 2155  Weight: 61.2 kg    Examination:  General exam: Appears calm and comfortable  Respiratory system:  Clear to auscultation.  Cardiovascular system: S1 & S2 heard,  Gastrointestinal system: Abdomen is nondistended, soft and nontender.  Central nervous system: Alert and oriented.  Patient moves all extremities. Extremities: No leg edema.     Data Reviewed: I have personally reviewed following labs and imaging studies  CBC: Recent Labs  Lab 07/12/22 2226 07/14/22 0438  WBC 7.4 18.2*  NEUTROABS 7.0 16.7*  HGB 11.3* 10.8*  HCT 35.0* 32.8*  MCV 97.5 95.6  PLT 246 175   Basic Metabolic Panel: Recent Labs  Lab 07/12/22 2226 07/14/22 0438  NA 140 142  K 4.1 3.8  CL 110 111  CO2 22 22  GLUCOSE 145* 98  BUN 28* 22  CREATININE 1.27* 1.25*  CALCIUM 8.9 8.5*  MG  --  1.8   GFR: Estimated Creatinine Clearance: 32.6 mL/min (A) (by C-G formula based on SCr of 1.25 mg/dL (H)). Liver Function Tests: Recent Labs  Lab 07/12/22 2226 07/14/22 0438  AST 387* 504*  ALT 217* 883*  ALKPHOS 98 120  BILITOT 1.1 1.1  PROT 6.7 5.6*  ALBUMIN 3.6 2.8*   Recent Labs  Lab 07/12/22 2226  LIPASE 65*   No results for input(s): "AMMONIA" in the last 168 hours. Coagulation Profile: No results for input(s): "INR", "PROTIME" in the last 168 hours. Cardiac Enzymes: No results for input(s): "CKTOTAL", "CKMB", "CKMBINDEX", "TROPONINI" in the last 168 hours. BNP (last 3 results) No results for input(s): "PROBNP" in the last 8760 hours. HbA1C: No results for input(s): "HGBA1C" in the last 72 hours. CBG: No results for input(s): "GLUCAP" in the last 168 hours. Lipid Profile: No results for input(s): "CHOL", "HDL", "LDLCALC", "TRIG", "CHOLHDL", "LDLDIRECT" in the last 72 hours. Thyroid Function Tests: No results for input(s): "TSH", "T4TOTAL", "FREET4", "T3FREE", "THYROIDAB" in the last 72 hours. Anemia Panel: No results for input(s): "VITAMINB12", "FOLATE", "FERRITIN", "TIBC", "IRON", "RETICCTPCT" in the last 72 hours. Urine analysis:    Component Value Date/Time   COLORURINE YELLOW  07/14/2022 1357   APPEARANCEUR CLEAR 07/14/2022 1357   APPEARANCEUR Clear 08/22/2015 0924   LABSPEC 1.017 07/14/2022 1357   PHURINE 5.0 07/14/2022 1357   GLUCOSEU NEGATIVE 07/14/2022 1357   HGBUR NEGATIVE 07/14/2022 1357   BILIRUBINUR NEGATIVE 07/14/2022 1357   BILIRUBINUR Negative 08/22/2015 0924   KETONESUR 5 (A) 07/14/2022 1357   PROTEINUR 100 (A) 07/14/2022 1357   UROBILINOGEN negative 07/19/2013 1319   NITRITE NEGATIVE 07/14/2022 1357   LEUKOCYTESUR NEGATIVE 07/14/2022 1357   Sepsis Labs: @LABRCNTIP (procalcitonin:4,lacticidven:4)  ) Recent Results (from the past 240 hour(s))  Culture, blood (Routine X 2) w Reflex to ID Panel     Status: None (Preliminary result)   Collection Time: 07/14/22 10:39 AM   Specimen: BLOOD LEFT ARM  Result Value Ref Range Status   Specimen Description BLOOD LEFT ARM  Final   Special Requests   Final    BOTTLES DRAWN AEROBIC ONLY Blood Culture results may not be optimal due to an inadequate volume of blood received in culture bottles Performed at [facility], [address] [place], [place] [address]    Culture PENDING  Incomplete   Report Status PENDING  Incomplete  Radiology Studies: DG CHEST PORT 1 VIEW  Result Date: 07/14/2022 CLINICAL DATA:  Provided history: Leukocytosis. EXAM: PORTABLE CHEST 1 VIEW COMPARISON:  Prior chest radiographs 02/22/2016 and earlier. FINDINGS: Heart size within normal limits. Aortic atherosclerosis. No appreciable airspace consolidation. No evidence of pleural effusion or pneumothorax. No acute osseous abnormality identified. Redemonstrated chronic left rib fracture deformities. IMPRESSION: 1. No evidence of an acute cardiopulmonary abnormality. 2. Aortic Atherosclerosis (ICD10-I70.0). Electronically Signed   By: Jackey Loge D.O.   On: 07/14/2022 11:18   MR ABDOMEN MRCP W WO CONTAST  Result Date: 07/14/2022 CLINICAL DATA:  Right upper quadrant abdominal pain. EXAM: MRI ABDOMEN WITHOUT AND WITH  CONTRAST (INCLUDING MRCP) TECHNIQUE: Multiplanar multisequence MR imaging of the abdomen was performed both before and after the administration of intravenous contrast. Heavily T2-weighted images of the biliary and pancreatic ducts were obtained, and three-dimensional MRCP images were rendered by post processing. CONTRAST:  7mL GADAVIST GADOBUTROL 1 MMOL/ML IV SOLN COMPARISON:  07/12/2022 FINDINGS: Exam detail is significantly diminished secondary to motion artifact. Lower chest: Small right pleural effusion. Hepatobiliary: Obscured by motion artifact. Numerous small T2 hyperintense foci are scattered throughout both lobes of liver which are favored to represent multiple small cysts and or biliary hamartomas. The largest of these is in segment 3 measuring 1.3 cm, image 18/4. Diffuse periportal edema as noted on the MRI from earlier today. Normal appearance of the gallbladder. No gallstones. No significant gallbladder wall thickening. No intrahepatic bile duct dilatation. 8 mm choledochal cyst arises off the common bile duct, image 61/8. At the level of the head of pancreas the distal CBD measures 4 mm. There is abrupt cut off of the head of pancreas where a possible 3 mm distal common bile duct stone is noted, image 62/8 Pancreas: No signs of pancreatic inflammation or main duct dilatation. No suspicious mass identified. Spleen: Within normal limits in size and appearance. No focal splenic abnormality. Adrenals/Urinary Tract:  Normal appearance of the adrenal glands. -There are numerous bilateral uniformly T2 hyperintense kidney cysts compatible with Bosniak class 1 and 2 lesions. The largest arises off the anterior cortex of the right kidney measuring 8.1 x 7.6 cm, image 35/4. -Solid enhancing lesion arising off the posterior cortex of the left kidney measures 1.1 cm, image 74/1101. -exophytic lesion arising off the inferior pole of the left kidney measures 1.5 cm, image 81/1101. This is T1 hyperintense, T2  isointense and exhibits mild progressive enhancement on the postcontrast images. -Arising off the anteromedial cortex of the left kidney is a exophytic lesion measuring 1 cm, image 33/4. On the postcontrast imaging this shows diffuse enhancement, image 74/1102. Additional remaining bilateral kidney lesions measure less than 1 cm and are technically too small to reliably characterize. No hydronephrosis identified bilaterally. Stomach/Bowel: Visualized portions within the abdomen are unremarkable. Vascular/Lymphatic: Normal caliber of the abdominal aorta. Aneurysmal dilatation of the portal vein is identified as noted on the exam from earlier today. The extrahepatic portal vein measures 5.4 x 3.4 cm proximal to the confluence. No portal venous thrombosis identified. No enlarged lymph nodes. Other:  No ascites or focal fluid collections. Musculoskeletal: No suspicious bone lesions identified. IMPRESSION: 1. Exam detail is significantly diminished secondary to motion artifact. 2. There is abrupt cut off of the distal CBD at the level of the head of pancreas where a possible 3 mm distal common bile duct stone is noted. No significant bile duct dilatation. Correlate for any clinical signs/symptoms of choledocholithiasis. 3. 8 mm choledochal cyst arises off  the common bile duct. 4. Aneurysmal dilatation of the portal vein as noted on the exam from earlier today. No portal venous thrombosis identified. 5. Numerous bilateral Bosniak class 1 and 2 kidney cysts. 6. There are 3 enhancing lesions arising off the inferior pole of the left kidney which measure up to 1.5 cm. These are concerning for small renal cell carcinomas. No signs solid organ or nodal metastasis within the abdomen. 7. Small right pleural effusion. Electronically Signed   By: Signa Kell M.D.   On: 07/14/2022 05:22   CT ABDOMEN PELVIS W CONTRAST  Result Date: 07/13/2022 CLINICAL DATA:  Abdominal pain, acute, nonlocalized EXAM: CT ABDOMEN AND PELVIS WITH  CONTRAST TECHNIQUE: Multidetector CT imaging of the abdomen and pelvis was performed using the standard protocol following bolus administration of intravenous contrast. RADIATION DOSE REDUCTION: This exam was performed according to the departmental dose-optimization program which includes automated exposure control, adjustment of the mA and/or kV according to patient size and/or use of iterative reconstruction technique. CONTRAST:  OMNIPAQUE IOHEXOL 300 MG/ML  SOLN COMPARISON:  None Available. FINDINGS: Lower chest: Extensive multi-vessel coronary artery calcification noted. Global cardiac size within normal limits. No pericardial effusion. Visualized lung bases are clear. Hepatobiliary: Tiny scattered hepatic hypodensities are seen throughout the liver which are too small to accurately characterize but may represent tiny hepatic cyst or biliary hamartoma. No enhancing intrahepatic mass identified. Mild periportal edema. Mild intrahepatic biliary ductal dilation of the level of the dilated portal vein which demonstrates mass effect upon the proximal extrahepatic bile duct. The distal extrahepatic bile duct is of normal caliber. The gallbladder is distended, however, no superimposed pericholecystic inflammatory changes are identified. The extrahepatic main portal vein is markedly focally dilated, measuring 3.8 x 4.6 cm in diameter spanning a length of 5.2 cm. The nominal diameter of the portal vein is 1.7 cm proximal to the area of focal dilation. No intraluminal thrombus or perivascular inflammatory stranding or fluid collection identified. Pancreas: Unremarkable Spleen: Unremarkable Adrenals/Urinary Tract: The adrenal glands are unremarkable. The kidneys are normal in size and position. Multiple simple cortical cysts are seen within the kidneys bilaterally for which no follow-up imaging is recommended. However, and indeterminate exophytic 13 mm enhancing mass is seen arising from the posterior, interpolar  region of the right kidney at axial image # 38/2 and a a 13 mm exophytic enhancing lesion is seen arising anteriorly from the lower pole the right kidney at axial image # 39/2 compatible with multiple small exophytic renal neoplasm. A indeterminate exophytic lesion is seen arising from the lower pole of the left kidney at axial image # 42/2. No hydronephrosis. No intrarenal or ureteral calculi. The bladder is unremarkable. Stomach/Bowel: Stomach is within normal limits. Appendix appears normal. No evidence of bowel wall thickening, distention, or inflammatory changes. Vascular/Lymphatic: Moderate aortoiliac atherosclerotic calcification. No aortic aneurysm. No pathologic adenopathy within the abdomen and pelvis. Reproductive: Prostatectomy has been performed. Other: No abdominal wall hernia. Musculoskeletal: The osseous structures are age-appropriate. Bilateral laminectomy and resection of the spinous processes of L2 and L3 has been performed. Large calcified posterior disc herniations at these levels resultant severe canal stenosis. No acute bone abnormality. No lytic or blastic bone lesion. IMPRESSION: 1. Extensive multi-vessel coronary artery calcification. 2. Focally dilated extrahepatic main portal vein measuring 3.8 x 4.6 cm in diameter spanning a length of 5.2 cm. The nominal diameter of the portal vein is 1.7 cm proximal to the area of focal dilation. No intraluminal thrombus or perivascular inflammatory stranding  or fluid collection identified. 3. Mild gallbladder distension and intrahepatic biliary ductal dilation of the level of the dilated main portal vein which demonstrates mass effect upon the proximal extrahepatic bile duct. The distal extrahepatic bile duct is of normal caliber. Correlation with liver enzymes may be helpful to exclude an obstructive process. 4. Multiple exophytic enhancing masses arising from the lower pole the left kidney suspicious for multiple renal neoplasms. If indicated,  dedicated renal mass protocol CT or MRI examination is recommended for further evaluation. 5. Status post prostatectomy. 6. Status post bilateral laminectomy and resection of the spinous processes of L2 and L3. Large calcified posterior disc herniations at these levels resultant severe canal stenosis. Aortic Atherosclerosis (ICD10-I70.0). Electronically Signed   By: Helyn Numbers M.D.   On: 07/13/2022 00:00        Scheduled Meds:  amLODipine  10 mg Oral Daily   benazepril  20 mg Oral Daily   heparin  5,000 Units Subcutaneous Q8H   Continuous Infusions:   LOS: 1 day    Time spent: 55 minutes.  Berton Mount, MD  [facility] Pager #: [phone] 7PM-7AM contact night coverage as above

## 2022-07-14 NOTE — Progress Notes (Signed)
Subjective: Abdominal pain resolved yesterday.  Objective: Vital signs in last 24 hours: Temp:  [98 F (36.7 C)-98.5 F (36.9 C)] 98.4 F (36.9 C) (07/01 0851) Pulse Rate:  [65-84] 84 (07/01 0851) Resp:  [17-19] 19 (07/01 0851) BP: (143-152)/(61-72) 144/68 (07/01 0851) SpO2:  [96 %-99 %] 99 % (07/01 0851) Weight change:     PE: GEN:  NAD, elderly but younger-appearing than stated age ABD:  Soft, non-tender  Lab Results: CBC    Component Value Date/Time   WBC 18.2 (H) 07/14/2022 0438   RBC 3.43 (L) 07/14/2022 0438   HGB 10.8 (L) 07/14/2022 0438   HGB 11.5 (L) 05/26/2022 1133   HCT 32.8 (L) 07/14/2022 0438   HCT 34.7 (L) 05/26/2022 1133   PLT 175 07/14/2022 0438   PLT 325 05/26/2022 1133   MCV 95.6 07/14/2022 0438   MCV 96 05/26/2022 1133   MCH 31.5 07/14/2022 0438   MCHC 32.9 07/14/2022 0438   RDW 13.2 07/14/2022 0438   RDW 12.6 05/26/2022 1133   LYMPHSABS 0.5 (L) 07/14/2022 0438   LYMPHSABS 1.2 05/26/2022 1133   MONOABS 0.8 07/14/2022 0438   EOSABS 0.0 07/14/2022 0438   EOSABS 0.1 05/26/2022 1133   BASOSABS 0.0 07/14/2022 0438   BASOSABS 0.1 05/26/2022 1133   CMP     Component Value Date/Time   NA 142 07/14/2022 0438   NA 145 (H) 05/26/2022 1133   K 3.8 07/14/2022 0438   CL 111 07/14/2022 0438   CO2 22 07/14/2022 0438   GLUCOSE 98 07/14/2022 0438   BUN 22 07/14/2022 0438   BUN 23 05/26/2022 1133   CREATININE 1.25 (H) 07/14/2022 0438   CALCIUM 8.5 (L) 07/14/2022 0438   PROT 5.6 (L) 07/14/2022 0438   PROT 6.2 05/26/2022 1133   ALBUMIN 2.8 (L) 07/14/2022 0438   ALBUMIN 3.9 05/26/2022 1133   AST 504 (H) 07/14/2022 0438   ALT 883 (H) 07/14/2022 0438   ALKPHOS 120 07/14/2022 0438   BILITOT 1.1 07/14/2022 0438   BILITOT 0.3 05/26/2022 1133   EGFR 55 (L) 05/26/2022 1133   GFRNONAA 54 (L) 07/14/2022 0438   Assessment:   Abdominal pain, resolved. Elevated LFTs.  Uptrending. Leukocytosis without fever.  Unclear etiology. MRCP possible distal CBD stone.   Given patient's symptom resolution, perhaps he passed stone last night.  Plan:   Slowly advance diet. Watch fever curve, WBC, LFTs. If tolerates diet well, and no fevers and decrease LFTs/WBC over next 24-36 hours, consider discharge home. If does not tolerate diet well, gets fevers and/or increase LFTs/WBC, would have to consider further intervention up to and including ERCP. [facility] will follow.   Benjamin Buchanan 07/14/2022, 10:07 AM   Cell [phone] If no answer or after 5 PM call [phone]

## 2022-07-14 NOTE — Plan of Care (Signed)
  Problem: Education: Goal: Knowledge of General Education information will improve Description: Including pain rating scale, medication(s)/side effects and non-pharmacologic comfort measures Outcome: Progressing   Problem: Health Behavior/Discharge Planning: Goal: Ability to manage health-related needs will improve Outcome: Progressing   Problem: Clinical Measurements: Goal: Ability to maintain clinical measurements within normal limits will improve Outcome: Progressing Goal: Will remain free from infection Outcome: Progressing Goal: Diagnostic test results will improve Outcome: Progressing Goal: Cardiovascular complication will be avoided Outcome: Progressing   Problem: Activity: Goal: Risk for activity intolerance will decrease Outcome: Progressing   Problem: Nutrition: Goal: Adequate nutrition will be maintained Outcome: Progressing   Problem: Coping: Goal: Level of anxiety will decrease Outcome: Progressing   Problem: Elimination: Goal: Will not experience complications related to bowel motility Outcome: Progressing Goal: Will not experience complications related to urinary retention Outcome: Progressing   Problem: Pain Managment: Goal: General experience of comfort will improve Outcome: Progressing   Problem: Safety: Goal: Ability to remain free from injury will improve Outcome: Progressing   

## 2022-07-14 NOTE — Progress Notes (Signed)
IR Procedure Request  - left renal mass biopsy  89 y.o. male. History of PVC, HTN, HLD, prostate cancer. Presented to the ED at [facility] on 6.29.24 with RUQ abdominal pain and nausea. MRI from 7.1.24 reads There are 3 enhancing lesions arising off the inferior pole of the left kidney which measure up to 1.5 cm. These are concerning for small renal cell carcinomas. Home medications list ASA 81 mg. Looking for a last dose now. . Heparin prophylactic Cr 1.25, Albumin 2.8, AST 504, ALT 883, total protein 5.6, GFR > 54. Team is requesting left renal mass biopsy for further evaluation.   Case discussed with IR Attending Dr. Marliss Coots. After review of imaging  states that the risk of bleeding from renal biopsy far outweighs any benefit having a pathological diagnosis may provide. Recommend outpatient Urologic consultation, and we can then consider biopsy if requested by Urology.   This was communicated directly to the Team by the IR Attending via EPIC Chat.

## 2022-07-15 ENCOUNTER — Other Ambulatory Visit (HOSPITAL_COMMUNITY): Payer: Self-pay | Admitting: Internal Medicine

## 2022-07-15 DIAGNOSIS — K8050 Calculus of bile duct without cholangitis or cholecystitis without obstruction: Secondary | ICD-10-CM | POA: Diagnosis not present

## 2022-07-15 DIAGNOSIS — R7881 Bacteremia: Secondary | ICD-10-CM | POA: Diagnosis not present

## 2022-07-15 DIAGNOSIS — R1013 Epigastric pain: Secondary | ICD-10-CM

## 2022-07-15 DIAGNOSIS — I10 Essential (primary) hypertension: Secondary | ICD-10-CM | POA: Diagnosis not present

## 2022-07-15 DIAGNOSIS — R748 Abnormal levels of other serum enzymes: Secondary | ICD-10-CM | POA: Diagnosis not present

## 2022-07-15 LAB — BLOOD CULTURE ID PANEL (REFLEXED) - BCID2

## 2022-07-15 LAB — CBC WITH DIFFERENTIAL/PLATELET
Abs Immature Granulocytes: 0.09 10*3/uL — ABNORMAL HIGH (ref 0.00–0.07)
Basophils Absolute: 0 10*3/uL (ref 0.0–0.1)
Basophils Relative: 0 %
Eosinophils Absolute: 0.1 10*3/uL (ref 0.0–0.5)
Eosinophils Relative: 1 %
HCT: 33.1 % — ABNORMAL LOW (ref 39.0–52.0)
Hemoglobin: 11 g/dL — ABNORMAL LOW (ref 13.0–17.0)
Immature Granulocytes: 1 %
Lymphocytes Relative: 6 %
Lymphs Abs: 0.6 10*3/uL — ABNORMAL LOW (ref 0.7–4.0)
MCH: 31.3 pg (ref 26.0–34.0)
MCHC: 33.2 g/dL (ref 30.0–36.0)
MCV: 94.3 fL (ref 80.0–100.0)
Monocytes Absolute: 0.5 10*3/uL (ref 0.1–1.0)
Monocytes Relative: 4 %
Neutro Abs: 9.9 10*3/uL — ABNORMAL HIGH (ref 1.7–7.7)
Neutrophils Relative %: 88 %
Platelets: 176 10*3/uL (ref 150–400)
RBC: 3.51 MIL/uL — ABNORMAL LOW (ref 4.22–5.81)
RDW: 12.8 % (ref 11.5–15.5)
WBC: 11.3 10*3/uL — ABNORMAL HIGH (ref 4.0–10.5)
nRBC: 0 % (ref 0.0–0.2)

## 2022-07-15 LAB — CULTURE, BLOOD (ROUTINE X 2)

## 2022-07-15 LAB — HEPATIC FUNCTION PANEL
ALT: 603 U/L — ABNORMAL HIGH (ref 0–44)
AST: 177 U/L — ABNORMAL HIGH (ref 15–41)
Albumin: 2.7 g/dL — ABNORMAL LOW (ref 3.5–5.0)
Alkaline Phosphatase: 128 U/L — ABNORMAL HIGH (ref 38–126)
Bilirubin, Direct: 0.3 mg/dL — ABNORMAL HIGH (ref 0.0–0.2)
Indirect Bilirubin: 0.8 mg/dL (ref 0.3–0.9)
Total Bilirubin: 1.1 mg/dL (ref 0.3–1.2)
Total Protein: 5.7 g/dL — ABNORMAL LOW (ref 6.5–8.1)

## 2022-07-15 MED ORDER — SODIUM CHLORIDE 0.9 % IV SOLN
2.0000 g | INTRAVENOUS | Status: DC
  Administered 2022-07-15 – 2022-07-18 (×4): 2 g via INTRAVENOUS
  Filled 2022-07-15 (×4): qty 20

## 2022-07-15 NOTE — Progress Notes (Signed)
Subjective: No abdominal pain. No blood in stool. No fevers.  Objective: Vital signs in last 24 hours: Temp:  [97.7 F (36.5 C)-98.2 F (36.8 C)] 97.7 F (36.5 C) (07/02 0808) Pulse Rate:  [69-87] 87 (07/02 0808) Resp:  [16-18] 17 (07/02 0808) BP: (117-141)/(62-73) 119/73 (07/02 0808) SpO2:  [98 %-99 %] 98 % (07/02 0808) Weight change:     PE: GEN:  Elderly, but much younger-appearing than stated age ABD:  Soft, non-tender  Lab Results: CBC    Component Value Date/Time   WBC 11.3 (H) 07/15/2022 0038   RBC 3.51 (L) 07/15/2022 0038   HGB 11.0 (L) 07/15/2022 0038   HGB 11.5 (L) 05/26/2022 1133   HCT 33.1 (L) 07/15/2022 0038   HCT 34.7 (L) 05/26/2022 1133   PLT 176 07/15/2022 0038   PLT 325 05/26/2022 1133   MCV 94.3 07/15/2022 0038   MCV 96 05/26/2022 1133   MCH 31.3 07/15/2022 0038   MCHC 33.2 07/15/2022 0038   RDW 12.8 07/15/2022 0038   RDW 12.6 05/26/2022 1133   LYMPHSABS 0.6 (L) 07/15/2022 0038   LYMPHSABS 1.2 05/26/2022 1133   MONOABS 0.5 07/15/2022 0038   EOSABS 0.1 07/15/2022 0038   EOSABS 0.1 05/26/2022 1133   BASOSABS 0.0 07/15/2022 0038   BASOSABS 0.1 05/26/2022 1133  CMP     Component Value Date/Time   NA 142 07/14/2022 0438   NA 145 (H) 05/26/2022 1133   K 3.8 07/14/2022 0438   CL 111 07/14/2022 0438   CO2 22 07/14/2022 0438   GLUCOSE 98 07/14/2022 0438   BUN 22 07/14/2022 0438   BUN 23 05/26/2022 1133   CREATININE 1.25 (H) 07/14/2022 0438   CALCIUM 8.5 (L) 07/14/2022 0438   PROT 5.7 (L) 07/15/2022 0038   PROT 6.2 05/26/2022 1133   ALBUMIN 2.7 (L) 07/15/2022 0038   ALBUMIN 3.9 05/26/2022 1133   AST 177 (H) 07/15/2022 0038   ALT 603 (H) 07/15/2022 0038   ALKPHOS 128 (H) 07/15/2022 0038   BILITOT 1.1 07/15/2022 0038   BILITOT 0.3 05/26/2022 1133   EGFR 55 (L) 05/26/2022 1133   GFRNONAA 54 (L) 07/14/2022 0438   Assessment:  Abdominal pain, resolved. Elevated LFTs.  Downtrending. Leukocytosis, resolving. MRCP possible distal CBD stone.   Given patient's symptom resolution, perhaps he passed stone last night. Renal mass.  Plan:   OK discharge home from GI perspective, with outpatient liver enzymes and outpatient follow-up with me in office. Work-up of renal mass per primary team and urology. [facility] will sign-off; please call with questions; thank you for the consultation.   Benjamin Buchanan 07/15/2022, 1:56 PM   Cell [phone] If no answer or after 5 PM call [phone]

## 2022-07-15 NOTE — Progress Notes (Signed)
  PROGRESS NOTE  Algis Yambao ZOX:[id number] DOB: Jun 15, 1928 DOA: 07/12/2022 PCP: [name], FNP  Brief History   ***  A & P  ***  ***  DVT prophylaxis: *** Code Status: *** Family Communication: *** Disposition Plan: ***    Zyla Dascenzo, DO [facility] Direct contact: see [email or web address]  7PM-7AM contact night coverage as above 07/15/2022, 6:49 PM  LOS: 2 days   Consultants  ***  Procedures  ***  Antibiotics  ***  Interval History/Subjective  ***  Objective   Vitals:  Vitals:   07/15/22 0808 07/15/22 1510  BP: 119/73 118/66  Pulse: 87 89  Resp: 17 18  Temp: 97.7 F (36.5 C) (!) 97.4 F (36.3 C)  SpO2: 98% 100%    Exam:  Constitutional:  Appears calm and comfortable Eyes:  pupils and irises appear normal Normal lids and conjunctivae ENMT:  grossly normal hearing  Lips appear normal external ears, nose appear normal Oropharynx: mucosa, tongue,posterior pharynx appear normal Neck:  neck appears normal, no masses, normal ROM, supple no thyromegaly Respiratory:  CTA bilaterally, no w/r/r.  Respiratory effort normal. No retractions or accessory muscle use Cardiovascular:  RRR, no m/r/g No LE extremity edema   Normal pedal pulses Abdomen:  Abdomen appears normal; no tenderness or masses No hernias No HSM Musculoskeletal:  Digits/nails BUE: no clubbing, cyanosis, petechiae, infection exam of joints, bones, muscles of at least one of following: head/neck, RUE, LUE, RLE, LLE   strength and tone normal, no atrophy, no abnormal movements No tenderness, masses Normal ROM, no contractures  gait and station Skin:  No rashes, lesions, ulcers palpation of skin: no induration or nodules Neurologic:  CN 2-12 intact Sensation all 4 extremities intact Psychiatric:  Mental status Mood, affect appropriate Orientation to person, place, time  judgment and insight appear intact  ***   I have personally reviewed the following:    Today's Data  ***  Lab Data  ***  Micro Data  ***  Imaging  ***  Cardiology Data  ***  Other Data  ***  Scheduled Meds:  amLODipine  10 mg Oral Daily   benazepril  20 mg Oral Daily   heparin  5,000 Units Subcutaneous Q8H   Continuous Infusions:  cefTRIAXone (ROCEPHIN)  IV 2 g (07/15/22 0440)    Principal Problem:   Abdominal pain Active Problems:   Essential hypertension   Mixed hyperlipidemia   Nausea   Transaminitis   Elevated lipase   LOS: 2 days

## 2022-07-15 NOTE — Progress Notes (Signed)
TC from the lab stating sputum test was not acceptable and needed to be recollected. Pt sleep at time of call. Informed day shift sputum would need to be recollected.

## 2022-07-15 NOTE — Progress Notes (Signed)
PHARMACY - PHYSICIAN COMMUNICATION CRITICAL VALUE ALERT - BLOOD CULTURE IDENTIFICATION (BCID)  Benjamin Buchanan is an 89 y.o. male who presented to [facility] on 07/12/2022 with a chief complaint of abdominal pain  Assessment:  WBC mildly elevated, elevated LFTs  Name of physician (or Provider) ContactedJohann Capers, NP ([facility])  Current antibiotics: None  Changes to prescribed antibiotics recommended:  Start Ceftriaxone 2g IV q24h  Results for orders placed or performed during the hospital encounter of 07/12/22  Blood Culture ID Panel (Reflexed) (Collected: 07/14/2022 11:09 AM)  Result Value Ref Range   Enterococcus faecalis NOT DETECTED NOT DETECTED   Enterococcus Faecium NOT DETECTED NOT DETECTED   Listeria monocytogenes NOT DETECTED NOT DETECTED   Staphylococcus species NOT DETECTED NOT DETECTED   Staphylococcus aureus (BCID) NOT DETECTED NOT DETECTED   Staphylococcus epidermidis NOT DETECTED NOT DETECTED   Staphylococcus lugdunensis NOT DETECTED NOT DETECTED   Streptococcus species NOT DETECTED NOT DETECTED   Streptococcus agalactiae NOT DETECTED NOT DETECTED   Streptococcus pneumoniae NOT DETECTED NOT DETECTED   Streptococcus pyogenes NOT DETECTED NOT DETECTED   A.calcoaceticus-baumannii NOT DETECTED NOT DETECTED   Bacteroides fragilis NOT DETECTED NOT DETECTED   Enterobacterales DETECTED (A) NOT DETECTED   Enterobacter cloacae complex NOT DETECTED NOT DETECTED   Escherichia coli NOT DETECTED NOT DETECTED   Klebsiella aerogenes NOT DETECTED NOT DETECTED   Klebsiella oxytoca NOT DETECTED NOT DETECTED   Klebsiella pneumoniae DETECTED (A) NOT DETECTED   Proteus species NOT DETECTED NOT DETECTED   Salmonella species NOT DETECTED NOT DETECTED   Serratia marcescens NOT DETECTED NOT DETECTED   Haemophilus influenzae NOT DETECTED NOT DETECTED   Neisseria meningitidis NOT DETECTED NOT DETECTED   Pseudomonas aeruginosa NOT DETECTED NOT DETECTED   Stenotrophomonas maltophilia  NOT DETECTED NOT DETECTED   Candida albicans NOT DETECTED NOT DETECTED   Candida auris NOT DETECTED NOT DETECTED   Candida glabrata NOT DETECTED NOT DETECTED   Candida krusei NOT DETECTED NOT DETECTED   Candida parapsilosis NOT DETECTED NOT DETECTED   Candida tropicalis NOT DETECTED NOT DETECTED   Cryptococcus neoformans/gattii NOT DETECTED NOT DETECTED   CTX-M ESBL NOT DETECTED NOT DETECTED   Carbapenem resistance IMP NOT DETECTED NOT DETECTED   Carbapenem resistance KPC NOT DETECTED NOT DETECTED   Carbapenem resistance NDM NOT DETECTED NOT DETECTED   Carbapenem resist OXA 48 LIKE NOT DETECTED NOT DETECTED   Carbapenem resistance VIM NOT DETECTED NOT DETECTED    Abran Duke 07/15/2022  2:17 AM

## 2022-07-16 DIAGNOSIS — K8050 Calculus of bile duct without cholangitis or cholecystitis without obstruction: Secondary | ICD-10-CM

## 2022-07-16 DIAGNOSIS — R748 Abnormal levels of other serum enzymes: Secondary | ICD-10-CM | POA: Diagnosis not present

## 2022-07-16 DIAGNOSIS — R7881 Bacteremia: Secondary | ICD-10-CM | POA: Diagnosis not present

## 2022-07-16 DIAGNOSIS — I10 Essential (primary) hypertension: Secondary | ICD-10-CM | POA: Diagnosis not present

## 2022-07-16 DIAGNOSIS — N2889 Other specified disorders of kidney and ureter: Secondary | ICD-10-CM

## 2022-07-16 LAB — CULTURE, BLOOD (ROUTINE X 2)

## 2022-07-16 NOTE — Evaluation (Signed)
Physical Therapy Evaluation Patient Details Name: Benjamin Buchanan MRN: [id number] DOB: 02-25-1928 Today's Date: 07/16/2022  History of Present Illness  Benjamin Buchanan is an 89 y.o. male who presented to [facility] on 07/12/2022 with a chief complaint of abdominal pain. Interventional radiology team was consulted for left renal masses.  Interventional radiology has advised the patient follow-up with urology team on discharge.  The risk of bleeding with biopsy was said to be high.  Abdominal pain has resolved.  Nausea vomiting have resolved. PMH of prostate cancer, hypertension, PVCs, anemia and hyperlipidemia.   Clinical Impression  PTA pt reports independence with functional mobility and ADL's, lives alone in a 1 story home with 1 STE. Pt reports no use of AD for ambulation at baseline. Today pt reports improved abdominal pain and demos independent levels for sit<>stand transfer, 80' of gait, and 3 stairs using R side handrail. Pt is at their baseline and independent with all functional mobility, no skilled PT needed or follow-up at this time. Will d/c. Thank you for the consult.      Assistance Recommended at Discharge PRN  If plan is discharge home, recommend the following:  Can travel by private vehicle  Assist for transportation        Equipment Recommendations None recommended by PT  Recommendations for Other Services       Functional Status Assessment Patient has not had a recent decline in their functional status     Precautions / Restrictions Precautions Precautions: Fall Restrictions Weight Bearing Restrictions: No      Mobility  Bed Mobility               General bed mobility comments: Seated in chair upon entry    Transfers Overall transfer level: Independent Equipment used: None               General transfer comment: Adequate push up to upright standing, no LOB or assist required    Ambulation/Gait Ambulation/Gait assistance:  Independent Gait Distance (Feet): 80 Feet Assistive device: None Gait Pattern/deviations: Step-through pattern, WFL(Within Functional Limits)   Gait velocity interpretation: >2.62 ft/sec, indicative of community ambulatory   General Gait Details: Symmetrical gait pattern with adequate stride length  Stairs Stairs: Yes Stairs assistance: Modified independent (Device/Increase time) Stair Management: One rail Right, Alternating pattern Number of Stairs: 3 General stair comments: Demos adequate stair clearance with no increased time to ascend or descend, uses R handrail.  Wheelchair Mobility     Tilt Bed    Modified Rankin (Stroke Patients Only)       Balance Overall balance assessment: Independent                                           Pertinent Vitals/Pain Pain Assessment Pain Assessment: No/denies pain    Home Living Family/patient expects to be discharged to:: Private residence Living Arrangements: Alone   Type of Home: House Home Access: Stairs to enter Entrance Stairs-Rails: Can reach both Entrance Stairs-Number of Steps: 1   Home Layout: One level Home Equipment: Agricultural consultant (2 wheels);Cane - single point;BSC/3in1;Shower seat;Grab bars - tub/shower      Prior Function Prior Level of Function : Independent/Modified Independent             Mobility Comments: No AD at baseline       Hand Dominance  Extremity/Trunk Assessment   Upper Extremity Assessment Upper Extremity Assessment: Overall WFL for tasks assessed    Lower Extremity Assessment Lower Extremity Assessment: Overall WFL for tasks assessed    Cervical / Trunk Assessment Cervical / Trunk Assessment: Normal  Communication   Communication: No difficulties  Cognition Arousal/Alertness: Awake/alert Behavior During Therapy: WFL for tasks assessed/performed Overall Cognitive Status: Within Functional Limits for tasks assessed                                           General Comments      Exercises     Assessment/Plan    PT Assessment Patient does not need any further PT services  PT Problem List         PT Treatment Interventions      PT Goals (Current goals can be found in the Care Plan section)  Acute Rehab PT Goals Patient Stated Goal: ready to get home PT Goal Formulation: All assessment and education complete, DC therapy    Frequency       Co-evaluation               AM-PAC PT "6 Clicks" Mobility  Outcome Measure Help needed turning from your back to your side while in a flat bed without using bedrails?: None Help needed moving from lying on your back to sitting on the side of a flat bed without using bedrails?: None Help needed moving to and from a bed to a chair (including a wheelchair)?: None Help needed standing up from a chair using your arms (e.g., wheelchair or bedside chair)?: None Help needed to walk in hospital room?: None Help needed climbing 3-5 steps with a railing? : None 6 Click Score: 24    End of Session Equipment Utilized During Treatment: Gait belt Activity Tolerance: Patient tolerated treatment well Patient left: in chair;with call bell/phone within reach Nurse Communication: Mobility status PT Visit Diagnosis: Pain    Time: 0981-1914 PT Time Calculation (min) (ACUTE ONLY): 15 min   Charges:   PT Evaluation $PT Eval Low Complexity: 1 Low   PT General Charges $$ ACUTE PT VISIT: 1 Visit         Hendricks Milo, SPT  Acute Rehabilitation Services   Hendricks Milo 07/16/2022, 2:47 PM

## 2022-07-16 NOTE — Plan of Care (Signed)

## 2022-07-16 NOTE — Progress Notes (Signed)
PHARMACY - PHYSICIAN COMMUNICATION CRITICAL VALUE ALERT - BLOOD CULTURE IDENTIFICATION (BCID)  Benjamin Buchanan is an 89 y.o. male who presented to [facility] on 07/12/2022  Assessment:  66 yom presenting with abdominal pain, found to have kleb pneumo bacteremia with no resistance detected. Micro notified 7/3 that additional bottle is positive for GNR, consistent with previous BCID result.  Name of physician (or Provider) Contacted: Virgel Manifold, A [facility])  Current antibiotics: ceftriaxone 2g IV q24h  Changes to prescribed antibiotics recommended:  None - already on recommended therapy  Results for orders placed or performed during the hospital encounter of 07/12/22  Blood Culture ID Panel (Reflexed) (Collected: 07/14/2022 11:09 AM)  Result Value Ref Range   Enterococcus faecalis NOT DETECTED NOT DETECTED   Enterococcus Faecium NOT DETECTED NOT DETECTED   Listeria monocytogenes NOT DETECTED NOT DETECTED   Staphylococcus species NOT DETECTED NOT DETECTED   Staphylococcus aureus (BCID) NOT DETECTED NOT DETECTED   Staphylococcus epidermidis NOT DETECTED NOT DETECTED   Staphylococcus lugdunensis NOT DETECTED NOT DETECTED   Streptococcus species NOT DETECTED NOT DETECTED   Streptococcus agalactiae NOT DETECTED NOT DETECTED   Streptococcus pneumoniae NOT DETECTED NOT DETECTED   Streptococcus pyogenes NOT DETECTED NOT DETECTED   A.calcoaceticus-baumannii NOT DETECTED NOT DETECTED   Bacteroides fragilis NOT DETECTED NOT DETECTED   Enterobacterales DETECTED (A) NOT DETECTED   Enterobacter cloacae complex NOT DETECTED NOT DETECTED   Escherichia coli NOT DETECTED NOT DETECTED   Klebsiella aerogenes NOT DETECTED NOT DETECTED   Klebsiella oxytoca NOT DETECTED NOT DETECTED   Klebsiella pneumoniae DETECTED (A) NOT DETECTED   Proteus species NOT DETECTED NOT DETECTED   Salmonella species NOT DETECTED NOT DETECTED   Serratia marcescens NOT DETECTED NOT DETECTED   Haemophilus influenzae NOT DETECTED  NOT DETECTED   Neisseria meningitidis NOT DETECTED NOT DETECTED   Pseudomonas aeruginosa NOT DETECTED NOT DETECTED   Stenotrophomonas maltophilia NOT DETECTED NOT DETECTED   Candida albicans NOT DETECTED NOT DETECTED   Candida auris NOT DETECTED NOT DETECTED   Candida glabrata NOT DETECTED NOT DETECTED   Candida krusei NOT DETECTED NOT DETECTED   Candida parapsilosis NOT DETECTED NOT DETECTED   Candida tropicalis NOT DETECTED NOT DETECTED   Cryptococcus neoformans/gattii NOT DETECTED NOT DETECTED   CTX-M ESBL NOT DETECTED NOT DETECTED   Carbapenem resistance IMP NOT DETECTED NOT DETECTED   Carbapenem resistance KPC NOT DETECTED NOT DETECTED   Carbapenem resistance NDM NOT DETECTED NOT DETECTED   Carbapenem resist OXA 48 LIKE NOT DETECTED NOT DETECTED   Carbapenem resistance VIM NOT DETECTED NOT DETECTED    Leia Alf, PharmD, BCPS Please check AMION for all [facility] contact numbers Clinical Pharmacist 07/16/2022 7:57 PM

## 2022-07-16 NOTE — Progress Notes (Addendum)
PROGRESS NOTE  Benjamin Buchanan ZOX:[id number] DOB: 12/12/1928 DOA: 07/12/2022 PCP: [name], FNP  Brief History    Patient is a 89 year old Caucasian male past medical history significant for prostate cancer, hypertension, PVCs, anemia and hyperlipidemia.  Patient presented to [facility] emergency room with abdominal pain, with associated nausea and vomiting.  [facility] AST was 387, ALT of 217, alkaline phosphatase of 19, lipase of 65, BUN of 28 and serum creatinine of 1.27.  Blood pressure was also elevated.  CT scan of the abdomen and pelvis with contrast done at [facility] revealed mild gallbladder distention and intrahepatic biliary ductal dilatation and multiple exophytic enhancing masses arising from the lower pole of the left kidney suspicious for multiple renal neoplasms.  GI team at [facility] advised that patient be transferred to [facility] for MRCP of the abdomen.  MRCP of the abdomen revealed possible 3 mm distal common bile duct stone, numerous bilateral Bosniak class I and 2 kidney cysts and 3 enhancing lesions arising off the left kidney which measures up to 1.5 cm.  These lesions were said to be concerning for small renal cell carcinomas.  Signs of solid organ or nodal metastasis within the abdomen.   07/14/2022: Patient seen alongside patient's daughter.  Patient's daughter was updated extensively.  Worsening LFTs noted today, with AST of 504, ALT of 883, total protein of 5.6, total bili of 1.1.  GGT done yesterday was elevated (228).  GI team has been consulted to assist with patient's management.  Interventional radiology team was consulted for the left renal masses.  Interventional radiology has advised the patient follow-up with urology team on discharge.  The risk of bleeding with biopsy was said to be high.  Abdominal pain has resolved.  Nausea vomiting have resolved.  No new complaints today.  On 07/15/2022 the patient  had 1/2 blood culture positive with Klebsiella pneumonia. Sensitivities are pending. Will repeat blood cultures on 07/16/2022. The patient is receiving IV ceftriaxone currently. LFT's are coming down.  Subjective  The patient is resting comfortably. No new complaints. The patient denies abdominal pain and is eating without difficulty.  Objective   Vitals:  Vitals:   07/16/22 0937 07/16/22 1618  BP: (!) 140/75 138/70  Pulse:  92  Resp:  16  Temp:  97.8 F (36.6 C)  SpO2:  100%    Exam:  Constitutional:  The patient is awake, alert, and oriented x 3. No acute distress. Eyes:  pupils and irises appear normal Normal lids and conjunctivae ENMT:  grossly normal hearing  Lips appear normal external ears, nose appear normal Oropharynx: mucosa, tongue,posterior pharynx appear normal Neck:  neck appears normal, no masses, normal ROM, supple no thyromegaly Respiratory:  No increased work of breathing. No wheezes, rales, or rhonchi No tactile fremitus Cardiovascular:  Regular rate and rhythm No murmurs, ectopy, or gallups. No lateral PMI. No thrills. Abdomen:  Abdomen is soft, non-tender, non-distended No hernias, masses, or organomegaly Normoactive bowel sounds.  Musculoskeletal:  No cyanosis, clubbing, or edema Skin:  No rashes, lesions, ulcers palpation of skin: no induration or nodules Neurologic:  CN 2-12 intact Sensation all 4 extremities intact Psychiatric:  Mental status Mood, affect appropriate Orientation to person, place, time  judgment and insight appear intact   I have personally reviewed the following:   Results for orders placed or performed during the hospital encounter of 07/12/22  Culture, blood (Routine X 2) w Reflex to ID Panel  Status: None (Preliminary result)   Collection Time: 07/14/22 10:39 AM   Specimen: BLOOD LEFT ARM  Result Value Ref Range Status   Specimen Description BLOOD LEFT ARM  Final   Special Requests   Final    BOTTLES  DRAWN AEROBIC ONLY Blood Culture results may not be optimal due to an inadequate volume of blood received in culture bottles   Culture   Final    NO GROWTH 2 DAYS Performed at [facility], [address] [place], [place] [address]    Report Status PENDING  Incomplete  Culture, blood (Routine X 2) w Reflex to ID Panel     Status: Abnormal (Preliminary result)   Collection Time: 07/14/22 11:09 AM   Specimen: BLOOD LEFT ARM  Result Value Ref Range Status   Specimen Description BLOOD LEFT ARM  Final   Special Requests   Final    BOTTLES DRAWN AEROBIC AND ANAEROBIC Blood Culture adequate volume   Culture  Setup Time   Final    GRAM NEGATIVE RODS IN BOTH AEROBIC AND ANAEROBIC BOTTLES CRITICAL RESULT CALLED TO, READ BACK BY AND VERIFIED WITH: PHARMD J. LEDFORD 07/15/22 @ 0210 BY AB    Culture (A)  Final    KLEBSIELLA PNEUMONIAE SUSCEPTIBILITIES TO FOLLOW Performed at [facility], [address] [place], [place] [address]    Report Status PENDING  Incomplete  Blood Culture ID Panel (Reflexed)     Status: Abnormal   Collection Time: 07/14/22 11:09 AM  Result Value Ref Range Status   Enterococcus faecalis NOT DETECTED NOT DETECTED Final   Enterococcus Faecium NOT DETECTED NOT DETECTED Final   Listeria monocytogenes NOT DETECTED NOT DETECTED Final   Staphylococcus species NOT DETECTED NOT DETECTED Final   Staphylococcus aureus (BCID) NOT DETECTED NOT DETECTED Final   Staphylococcus epidermidis NOT DETECTED NOT DETECTED Final   Staphylococcus lugdunensis NOT DETECTED NOT DETECTED Final   Streptococcus species NOT DETECTED NOT DETECTED Final   Streptococcus agalactiae NOT DETECTED NOT DETECTED Final   Streptococcus pneumoniae NOT DETECTED NOT DETECTED Final   Streptococcus pyogenes NOT DETECTED NOT DETECTED Final   A.calcoaceticus-baumannii NOT DETECTED NOT DETECTED Final   Bacteroides fragilis NOT DETECTED NOT DETECTED Final   Enterobacterales DETECTED (A) NOT DETECTED  Final    Comment: Enterobacterales represent a large order of gram negative bacteria, not a single organism. CRITICAL RESULT CALLED TO, READ BACK BY AND VERIFIED WITH: PHARMD J. LEDFORD 07/15/22 @ 0210 BY AB    Enterobacter cloacae complex NOT DETECTED NOT DETECTED Final   Escherichia coli NOT DETECTED NOT DETECTED Final   Klebsiella aerogenes NOT DETECTED NOT DETECTED Final   Klebsiella oxytoca NOT DETECTED NOT DETECTED Final   Klebsiella pneumoniae DETECTED (A) NOT DETECTED Final    Comment: CRITICAL RESULT CALLED TO, READ BACK BY AND VERIFIED WITH: PHARMD J. LEDFORD 07/15/22 @ 0210 BY AB    Proteus species NOT DETECTED NOT DETECTED Final   Salmonella species NOT DETECTED NOT DETECTED Final   Serratia marcescens NOT DETECTED NOT DETECTED Final   Haemophilus influenzae NOT DETECTED NOT DETECTED Final   Neisseria meningitidis NOT DETECTED NOT DETECTED Final   Pseudomonas aeruginosa NOT DETECTED NOT DETECTED Final   Stenotrophomonas maltophilia NOT DETECTED NOT DETECTED Final   Candida albicans NOT DETECTED NOT DETECTED Final   Candida auris NOT DETECTED NOT DETECTED Final   Candida glabrata NOT DETECTED NOT DETECTED Final   Candida krusei NOT DETECTED NOT DETECTED Final   Candida parapsilosis NOT  DETECTED NOT DETECTED Final   Candida tropicalis NOT DETECTED NOT DETECTED Final   Cryptococcus neoformans/gattii NOT DETECTED NOT DETECTED Final   CTX-M ESBL NOT DETECTED NOT DETECTED Final   Carbapenem resistance IMP NOT DETECTED NOT DETECTED Final   Carbapenem resistance KPC NOT DETECTED NOT DETECTED Final   Carbapenem resistance NDM NOT DETECTED NOT DETECTED Final   Carbapenem resist OXA 48 LIKE NOT DETECTED NOT DETECTED Final   Carbapenem resistance VIM NOT DETECTED NOT DETECTED Final    Comment: Performed at [facility], [address], [place], [place] [address]  Expectorated Sputum Assessment w Gram Stain, Rflx to Resp Cult     Status: None   Collection Time: 07/14/22  5:12 PM    Specimen: Sputum  Result Value Ref Range Status   Specimen Description SPU  Final   Special Requests NONE  Final   Sputum evaluation   Final    Sputum specimen not acceptable for testing.  Please recollect.   Gram Stain Report Called to,Read Back By and Verified With: RN Rudene Anda [phone] @ 2150 FH Performed at [facility], [address] [place], [place] [address]    Report Status 07/14/2022 FINAL  Final     Scheduled Meds:  amLODipine  10 mg Oral Daily   benazepril  20 mg Oral Daily   heparin  5,000 Units Subcutaneous Q8H   Continuous Infusions:  cefTRIAXone (ROCEPHIN)  IV 2 g (07/16/22 0245)    Principal Problem:   Abdominal pain Active Problems:   Essential hypertension   Mixed hyperlipidemia   Nausea   Transaminitis   Elevated lipase   Bacteremia   Klebsiella Bacteremia  A & P  Abdominal pain: -Resolved significantly. -MRCP revealed distal common bile duct stone. -Continue supportive care. -GI input is appreciated. -Repeat LFTs in the morning.   Common bile duct stone: -Suspicion the patient may have passed a stone. -Follow LFTs in the morning.   Left renal masses: -Concerning for possible malignancy. -Follow-up with urology on discharge. -IR input is as documented above.  Klebsiella Bacteremia:  2/2 blood cultures positive for Klebsiella pneumoniae.  The patient is receiving Rocephin 2 gm IV daily. Sensitivities are pending. Will repeat Blood cultures x 2 tomorrow.   Elevated GGT: -Patient continues to deny alcohol use.   Elevated LFTs: -Repeat level in the morning. -GI input is appreciated. -If LFTs remain elevated, GI documented that they may consider ERCP.   Hypertension: -Continue to monitor closely.   Chronic kidney disease stage IIIa: -Stable. -Continue to monitor closely.     DVT prophylaxis: Subcutaneous heparin Code Status: Full code Family Communication: Daughter Disposition Plan: To be discharged back home.      Consultants:  GI team. Interventional radiology team. Patient will need to follow-up with urology team on discharge.   Procedures:  MRCP of abdomen and pelvis.   Antimicrobials:  Ceftriaxone 2 gm IV daily.  I have seen and examined this patient myself. I have spent 34 minutes in his evaluation and care.   Tishie Altmann, DO [facility] Direct contact: see [email or web address]  7PM-7AM contact night coverage as above 07/16/2022, 4:55 PM  LOS: 2 days     LOS: 3 days

## 2022-07-16 NOTE — Assessment & Plan Note (Signed)
Klebsiella pneumonia have grown from 2/2 blood cultures on 07/15/2022. The patient is receiving IV Rocephin. Sensitivities are pending. Will repeat blood cultures on 07/16/2022.

## 2022-07-16 NOTE — Care Management Important Message (Signed)
Important Message  Patient Details  Name: Benjamin Buchanan MRN: [id number] Date of Birth: 03/28/1928   Medicare Important Message Given:  Yes     Sherilyn Banker 07/16/2022, 12:08 PM

## 2022-07-16 NOTE — Evaluation (Deleted)
Physical Therapy Treatment Patient Details Name: Murvin Duggan MRN: [id number] DOB: [birth year] Today's Date: 07/16/2022   History of Present Illness Ladon Dimitar Schamp is an 89 y.o. male who presented to [facility] on 07/12/2022 with a chief complaint of abdominal pain. Interventional radiology team was consulted for left renal masses.  Interventional radiology has advised the patient follow-up with urology team on discharge.  The risk of bleeding with biopsy was said to be high.  Abdominal pain has resolved.  Nausea vomiting have resolved. PMH of prostate cancer, hypertension, PVCs, anemia and hyperlipidemia.    PT Comments  PTA pt reports independence with functional mobility and ADL's, lives alone in a 1 story home with 1 STE. Pt reports no use of AD for ambulation at baseline. Today pt reports improved abdominal pain and demos independent levels for sit<>stand transfer, 80' of gait, and 3 stairs using R side handrail. Pt is at their baseline and independent with all functional mobility, no skilled PT needed or follow-up at this time. Will d/c. Thank you for the consult.    Assistance Recommended at Discharge PRN  If plan is discharge home, recommend the following:  Can travel by private vehicle    Assist for transportation      Equipment Recommendations  None recommended by PT    Recommendations for Other Services       Precautions / Restrictions Precautions Precautions: Fall Restrictions Weight Bearing Restrictions: No     Mobility  Bed Mobility               General bed mobility comments: Seated in chair upon entry    Transfers Overall transfer level: Independent Equipment used: None               General transfer comment: Adequate push up to upright standing, no LOB or assist required    Ambulation/Gait Ambulation/Gait assistance: Independent Gait Distance (Feet): 80 Feet Assistive device: None Gait Pattern/deviations: Step-through pattern,  WFL(Within Functional Limits)   Gait velocity interpretation: >2.62 ft/sec, indicative of community ambulatory   General Gait Details: Symmetrical gait pattern with adequate stride length   Stairs Stairs: Yes Stairs assistance: Modified independent (Device/Increase time) Stair Management: One rail Right, Alternating pattern Number of Stairs: 3 General stair comments: Demos adequate stair clearance with no increased time to ascend or descend, uses R handrail.   Wheelchair Mobility     Tilt Bed    Modified Rankin (Stroke Patients Only)       Balance Overall balance assessment: Independent                                          Cognition Arousal/Alertness: Awake/alert Behavior During Therapy: WFL for tasks assessed/performed Overall Cognitive Status: Within Functional Limits for tasks assessed                                          Exercises      General Comments        Pertinent Vitals/Pain Pain Assessment Pain Assessment: No/denies pain    Home Living Family/patient expects to be discharged to:: Private residence Living Arrangements: Alone   Type of Home: House Home Access: Stairs to enter Entrance Stairs-Rails: Can reach both Entrance Stairs-Number of Steps: 1   Home Layout: One level Home  Equipment: Agricultural consultant (2 wheels);Cane - single point;BSC/3in1;Shower seat;Grab bars - tub/shower      Prior Function            PT Goals (current goals can now be found in the care plan section) Acute Rehab PT Goals Patient Stated Goal: ready to get home PT Goal Formulation: All assessment and education complete, DC therapy    Frequency           PT Plan      Co-evaluation              AM-PAC PT "6 Clicks" Mobility   Outcome Measure  Help needed turning from your back to your side while in a flat bed without using bedrails?: None Help needed moving from lying on your back to sitting on the side of a  flat bed without using bedrails?: None Help needed moving to and from a bed to a chair (including a wheelchair)?: None Help needed standing up from a chair using your arms (e.g., wheelchair or bedside chair)?: None Help needed to walk in hospital room?: None Help needed climbing 3-5 steps with a railing? : None 6 Click Score: 24    End of Session Equipment Utilized During Treatment: Gait belt Activity Tolerance: Patient tolerated treatment well Patient left: in chair;with call bell/phone within reach Nurse Communication: Mobility status PT Visit Diagnosis: Pain     Time: 1610-9604 PT Time Calculation (min) (ACUTE ONLY): 15 min  Charges:      PT General Charges $$ ACUTE PT VISIT: 1 Visit                     Hendricks Milo, SPT  Acute Rehabilitation Services    Hendricks Milo 07/16/2022, 2:44 PM

## 2022-07-17 DIAGNOSIS — R1010 Upper abdominal pain, unspecified: Secondary | ICD-10-CM | POA: Diagnosis not present

## 2022-07-17 LAB — CULTURE, BLOOD (ROUTINE X 2)
Culture: NO GROWTH
Special Requests: ADEQUATE
Special Requests: ADEQUATE

## 2022-07-17 LAB — HEPATIC FUNCTION PANEL
ALT: 244 U/L — ABNORMAL HIGH (ref 0–44)
AST: 56 U/L — ABNORMAL HIGH (ref 15–41)
Albumin: 2.5 g/dL — ABNORMAL LOW (ref 3.5–5.0)
Alkaline Phosphatase: 130 U/L — ABNORMAL HIGH (ref 38–126)
Bilirubin, Direct: 0.1 mg/dL (ref 0.0–0.2)
Indirect Bilirubin: 0.3 mg/dL (ref 0.3–0.9)
Total Bilirubin: 0.4 mg/dL (ref 0.3–1.2)
Total Protein: 5.4 g/dL — ABNORMAL LOW (ref 6.5–8.1)

## 2022-07-17 MED ORDER — ACETAMINOPHEN 325 MG PO TABS
650.0000 mg | ORAL_TABLET | Freq: Four times a day (QID) | ORAL | Status: DC | PRN
  Administered 2022-07-17 – 2022-07-18 (×2): 650 mg via ORAL
  Filled 2022-07-17 (×2): qty 2

## 2022-07-17 NOTE — Progress Notes (Signed)
PROGRESS NOTE  Benjamin Buchanan ZOX:[id number] DOB: September 08, 1928 DOA: 07/12/2022 PCP: [name], FNP  Brief History    Patient is a 89 year old Caucasian male past medical history significant for prostate cancer, hypertension, PVCs, anemia and hyperlipidemia.  Patient presented to [facility] emergency room with abdominal pain, with associated nausea and vomiting.  [facility], AST was 387, ALT of 217, alkaline phosphatase of 19, lipase of 65, BUN of 28 and serum creatinine of 1.27.  Blood pressure was also elevated.  CT scan of the abdomen and pelvis with contrast done at [facility] revealed mild gallbladder distention and intrahepatic biliary ductal dilatation and multiple exophytic enhancing masses arising from the lower pole of the left kidney suspicious for multiple renal neoplasms.  GI team at [facility] advised that patient be transferred to [facility] for MRCP of the abdomen.  MRCP of the abdomen revealed possible 3 mm distal common bile duct stone, numerous bilateral Bosniak class I and 2 kidney cysts and 3 enhancing lesions arising off the left kidney which measures up to 1.5 cm.  These lesions were said to be concerning for small renal cell carcinomas.  Signs of solid organ or nodal metastasis within the abdomen.   07/14/2022: Patient seen alongside patient's daughter.  Patient's daughter was updated extensively.  Worsening LFTs noted today, with AST of 504, ALT of 883, total protein of 5.6, total bili of 1.1.  GGT done yesterday was elevated (228).  GI team has been consulted to assist with patient's management.  Interventional radiology team was consulted for the left renal masses.  Interventional radiology has advised the patient follow-up with urology team on discharge.  The risk of bleeding with biopsy was said to be high.  Abdominal pain has resolved.  Nausea vomiting have resolved.  No new complaints today.  On 07/15/2022 the patient  had 1/2 blood culture positive with Klebsiella pneumonia. Sensitivities are pending. Will repeat blood cultures on 07/16/2022. The patient is receiving IV ceftriaxone currently. LFT's are coming down.  07/17/2022: Blood culture grew Klebsiella pneumonia 2 out of 2.  Patient is currently on IV Rocephin.  Sensitivities noted.    Subjective  -No fever or chills No GI symptoms. Objective   Vitals:  Vitals:   07/17/22 0815 07/17/22 1509  BP: (!) 141/77 123/71  Pulse: 87 85  Resp: 17 18  Temp: 97.8 F (36.6 C) 98 F (36.7 C)  SpO2: 96% 100%    Exam:  Constitutional:  The patient is awake, alert, and oriented x 3. No acute distress. HEENT: Patient is pale. No jaundice.   Neck:  neck is supple.  No raised JVD.   Respiratory:  Clear to auscultation.   Cardiovascular:  S1-S2.Marland Kitchen Abdomen:  Abdomen is soft, non-tender Musculoskeletal:  No leg edema.  Neurologic:  Awake and alert.  Patient moves all extremities.     I have personally reviewed the following:   Results for orders placed or performed during the hospital encounter of 07/12/22  Culture, blood (Routine X 2) w Reflex to ID Panel     Status: Abnormal   Collection Time: 07/14/22 10:39 AM   Specimen: BLOOD LEFT ARM  Result Value Ref Range Status   Specimen Description BLOOD LEFT ARM  Final   Special Requests   Final    BOTTLES DRAWN AEROBIC ONLY Blood Culture results may not be optimal due to an inadequate volume of blood received in culture bottles   Culture  Setup  Time   Final    GRAM NEGATIVE RODS AEROBIC BOTTLE ONLY CRITICAL RESULT CALLED TO, READ BACK BY AND VERIFIED WITH: PHARMD HAILEY VON DOLEN ON 07/16/22 @ 1955 BY DRT CRITICAL VALUE NOTED.  VALUE IS CONSISTENT WITH PREVIOUSLY REPORTED AND CALLED VALUE.    Culture (A)  Final    KLEBSIELLA PNEUMONIAE SUSCEPTIBILITIES PERFORMED ON PREVIOUS CULTURE WITHIN THE LAST 5 DAYS. Performed at [facility], [address] [place], [place] [address]    Report Status  07/17/2022 FINAL  Final  Culture, blood (Routine X 2) w Reflex to ID Panel     Status: Abnormal   Collection Time: 07/14/22 11:09 AM   Specimen: BLOOD LEFT ARM  Result Value Ref Range Status   Specimen Description BLOOD LEFT ARM  Final   Special Requests   Final    BOTTLES DRAWN AEROBIC AND ANAEROBIC Blood Culture adequate volume   Culture  Setup Time   Final    GRAM NEGATIVE RODS IN BOTH AEROBIC AND ANAEROBIC BOTTLES CRITICAL RESULT CALLED TO, READ BACK BY AND VERIFIED WITH: PHARMD J. LEDFORD 07/15/22 @ 0210 BY AB Performed at [facility], [address] [place], [place] [address]    Culture KLEBSIELLA PNEUMONIAE (A)  Final   Report Status 07/17/2022 FINAL  Final   Organism ID, Bacteria KLEBSIELLA PNEUMONIAE  Final      Susceptibility   Klebsiella pneumoniae - MIC*    AMPICILLIN >=32 RESISTANT Resistant     CEFEPIME <=0.12 SENSITIVE Sensitive     CEFTAZIDIME <=1 SENSITIVE Sensitive     CEFTRIAXONE <=0.25 SENSITIVE Sensitive     CIPROFLOXACIN <=0.25 SENSITIVE Sensitive     GENTAMICIN <=1 SENSITIVE Sensitive     IMIPENEM <=0.25 SENSITIVE Sensitive     TRIMETH/SULFA <=20 SENSITIVE Sensitive     AMPICILLIN/SULBACTAM 4 SENSITIVE Sensitive     PIP/TAZO <=4 SENSITIVE Sensitive     * KLEBSIELLA PNEUMONIAE  Blood Culture ID Panel (Reflexed)     Status: Abnormal   Collection Time: 07/14/22 11:09 AM  Result Value Ref Range Status   Enterococcus faecalis NOT DETECTED NOT DETECTED Final   Enterococcus Faecium NOT DETECTED NOT DETECTED Final   Listeria monocytogenes NOT DETECTED NOT DETECTED Final   Staphylococcus species NOT DETECTED NOT DETECTED Final   Staphylococcus aureus (BCID) NOT DETECTED NOT DETECTED Final   Staphylococcus epidermidis NOT DETECTED NOT DETECTED Final   Staphylococcus lugdunensis NOT DETECTED NOT DETECTED Final   Streptococcus species NOT DETECTED NOT DETECTED Final   Streptococcus agalactiae NOT DETECTED NOT DETECTED Final   Streptococcus pneumoniae NOT  DETECTED NOT DETECTED Final   Streptococcus pyogenes NOT DETECTED NOT DETECTED Final   A.calcoaceticus-baumannii NOT DETECTED NOT DETECTED Final   Bacteroides fragilis NOT DETECTED NOT DETECTED Final   Enterobacterales DETECTED (A) NOT DETECTED Final    Comment: Enterobacterales represent a large order of gram negative bacteria, not a single organism. CRITICAL RESULT CALLED TO, READ BACK BY AND VERIFIED WITH: PHARMD J. LEDFORD 07/15/22 @ 0210 BY AB    Enterobacter cloacae complex NOT DETECTED NOT DETECTED Final   Escherichia coli NOT DETECTED NOT DETECTED Final   Klebsiella aerogenes NOT DETECTED NOT DETECTED Final   Klebsiella oxytoca NOT DETECTED NOT DETECTED Final   Klebsiella pneumoniae DETECTED (A) NOT DETECTED Final    Comment: CRITICAL RESULT CALLED TO, READ BACK BY AND VERIFIED WITH: PHARMD J. LEDFORD 07/15/22 @ 0210 BY AB    Proteus species NOT DETECTED NOT DETECTED Final   Salmonella species  NOT DETECTED NOT DETECTED Final   Serratia marcescens NOT DETECTED NOT DETECTED Final   Haemophilus influenzae NOT DETECTED NOT DETECTED Final   Neisseria meningitidis NOT DETECTED NOT DETECTED Final   Pseudomonas aeruginosa NOT DETECTED NOT DETECTED Final   Stenotrophomonas maltophilia NOT DETECTED NOT DETECTED Final   Candida albicans NOT DETECTED NOT DETECTED Final   Candida auris NOT DETECTED NOT DETECTED Final   Candida glabrata NOT DETECTED NOT DETECTED Final   Candida krusei NOT DETECTED NOT DETECTED Final   Candida parapsilosis NOT DETECTED NOT DETECTED Final   Candida tropicalis NOT DETECTED NOT DETECTED Final   Cryptococcus neoformans/gattii NOT DETECTED NOT DETECTED Final   CTX-M ESBL NOT DETECTED NOT DETECTED Final   Carbapenem resistance IMP NOT DETECTED NOT DETECTED Final   Carbapenem resistance KPC NOT DETECTED NOT DETECTED Final   Carbapenem resistance NDM NOT DETECTED NOT DETECTED Final   Carbapenem resist OXA 48 LIKE NOT DETECTED NOT DETECTED Final   Carbapenem  resistance VIM NOT DETECTED NOT DETECTED Final    Comment: Performed at [facility], [address], [place], [place] [address]  Expectorated Sputum Assessment w Gram Stain, Rflx to Resp Cult     Status: None   Collection Time: 07/14/22  5:12 PM   Specimen: Sputum  Result Value Ref Range Status   Specimen Description SPU  Final   Special Requests NONE  Final   Sputum evaluation   Final    Sputum specimen not acceptable for testing.  Please recollect.   Gram Stain Report Called to,Read Back By and Verified With: RN Rudene Anda [phone] @ 2150 FH Performed at [facility], [address] [place], [place] [address]    Report Status 07/14/2022 FINAL  Final  Culture, blood (Routine X 2) w Reflex to ID Panel     Status: None (Preliminary result)   Collection Time: 07/16/22  5:08 PM   Specimen: BLOOD  Result Value Ref Range Status   Specimen Description BLOOD LEFT ANTECUBITAL  Final   Special Requests   Final    BOTTLES DRAWN AEROBIC AND ANAEROBIC Blood Culture adequate volume   Culture   Final    NO GROWTH < 24 HOURS Performed at [facility], [address] [place], [place] [address]    Report Status PENDING  Incomplete  Culture, blood (Routine X 2) w Reflex to ID Panel     Status: None (Preliminary result)   Collection Time: 07/16/22  5:09 PM   Specimen: BLOOD  Result Value Ref Range Status   Specimen Description BLOOD RIGHT ANTECUBITAL  Final   Special Requests   Final    BOTTLES DRAWN AEROBIC ONLY Blood Culture results may not be optimal due to an inadequate volume of blood received in culture bottles   Culture   Final    NO GROWTH < 24 HOURS Performed at [facility], [address] [place], [place] [address]    Report Status PENDING  Incomplete     Scheduled Meds:  amLODipine  10 mg Oral Daily   benazepril  20 mg Oral Daily   heparin  5,000 Units Subcutaneous Q8H   Continuous Infusions:  cefTRIAXone (ROCEPHIN)  IV 2 g (07/17/22 0323)     Principal Problem:   Abdominal pain Active Problems:   Essential hypertension   Mixed hyperlipidemia   Nausea   Transaminitis   Elevated lipase   Bacteremia   Klebsiella Bacteremia  A & P  Abdominal pain: -  Resolved significantly. -MRCP revealed distal common bile duct stone. -Continue supportive care. -GI input is appreciated. -Repeat LFTs in the morning.   Common bile duct stone: -Suspicion the patient may have passed a stone. -Follow LFTs in the morning. 07/17/2022: Check LFTs in the morning.   Left renal masses: -Concerning for possible malignancy. -Follow-up with urology on discharge. -IR input is as documented above.  Klebsiella Bacteremia:  2/2 blood cultures positive for Klebsiella pneumoniae.  The patient is receiving Rocephin 2 gm IV daily. Sensitivities are pending. Will repeat Blood cultures x 2 tomorrow.   Elevated GGT: -Patient continues to deny alcohol use.   Elevated LFTs: -Repeat level in the morning. -GI input is appreciated. -If LFTs remain elevated, GI documented that they may consider ERCP.   Hypertension: -Continue to monitor closely.   Chronic kidney disease stage IIIa: -Stable. -Continue to monitor closely.     DVT prophylaxis: Subcutaneous heparin Code Status: Full code Family Communication: Daughter Disposition Plan: To be discharged back home.     Consultants:  GI team. Interventional radiology team. Patient will need to follow-up with urology team on discharge.   Procedures:  MRCP of abdomen and pelvis.   Antimicrobials:  Ceftriaxone 2 gm IV daily.  I have seen and examined this patient myself. I have spent 34 minutes in his evaluation and care.   Barnetta Chapel, M.D. [facility] Direct contact: see [email or web address]  7PM-7AM contact night coverage as above 07/16/2022, 4:55 PM  LOS: 2 days     LOS: 4 days

## 2022-07-18 DIAGNOSIS — R109 Unspecified abdominal pain: Secondary | ICD-10-CM

## 2022-07-18 LAB — CBC WITH DIFFERENTIAL/PLATELET
Abs Immature Granulocytes: 0.18 10*3/uL — ABNORMAL HIGH (ref 0.00–0.07)
Basophils Absolute: 0 10*3/uL (ref 0.0–0.1)
Basophils Relative: 0 %
Eosinophils Absolute: 0.3 10*3/uL (ref 0.0–0.5)
Eosinophils Relative: 3 %
HCT: 34.3 % — ABNORMAL LOW (ref 39.0–52.0)
Hemoglobin: 11.4 g/dL — ABNORMAL LOW (ref 13.0–17.0)
Immature Granulocytes: 2 %
Lymphocytes Relative: 21 %
Lymphs Abs: 1.9 10*3/uL (ref 0.7–4.0)
MCH: 30.6 pg (ref 26.0–34.0)
MCHC: 33.2 g/dL (ref 30.0–36.0)
MCV: 92 fL (ref 80.0–100.0)
Monocytes Absolute: 1 10*3/uL (ref 0.1–1.0)
Monocytes Relative: 10 %
Neutro Abs: 5.9 10*3/uL (ref 1.7–7.7)
Neutrophils Relative %: 64 %
Platelets: 194 10*3/uL (ref 150–400)
RBC: 3.73 MIL/uL — ABNORMAL LOW (ref 4.22–5.81)
RDW: 12.6 % (ref 11.5–15.5)
WBC: 9.4 10*3/uL (ref 4.0–10.5)
nRBC: 0 % (ref 0.0–0.2)

## 2022-07-18 LAB — RENAL FUNCTION PANEL
Albumin: 2.6 g/dL — ABNORMAL LOW (ref 3.5–5.0)
Anion gap: 10 (ref 5–15)
BUN: 24 mg/dL — ABNORMAL HIGH (ref 8–23)
CO2: 22 mmol/L (ref 22–32)
Calcium: 8.3 mg/dL — ABNORMAL LOW (ref 8.9–10.3)
Chloride: 109 mmol/L (ref 98–111)
Creatinine, Ser: 1.27 mg/dL — ABNORMAL HIGH (ref 0.61–1.24)
GFR, Estimated: 53 mL/min — ABNORMAL LOW (ref >60)
Glucose, Bld: 99 mg/dL (ref 70–99)
Phosphorus: 3.5 mg/dL (ref 2.5–4.6)
Potassium: 3.2 mmol/L — ABNORMAL LOW (ref 3.5–5.1)
Sodium: 141 mmol/L (ref 135–145)

## 2022-07-18 LAB — MAGNESIUM: Magnesium: 1.7 mg/dL (ref 1.7–2.4)

## 2022-07-18 MED ORDER — AMOXICILLIN-POT CLAVULANATE 875-125 MG PO TABS: 1.0000 | ORAL_TABLET | Freq: Two times a day (BID) | ORAL | 0 refills | Status: AC

## 2022-07-18 MED ORDER — VITAMIN C 500 MG PO TABS
500.00 mg | ORAL_TABLET | Freq: Every day | ORAL | 0 refills | Status: AC
Start: 2022-07-18 — End: 2022-08-17

## 2022-07-18 NOTE — Plan of Care (Signed)

## 2022-07-18 NOTE — TOC Transition Note (Signed)
Transition of Care [facility]) - CM/SW Discharge Note   Patient Details  Name: Benjamin Buchanan MRN: [id number] Date of Birth: Jun 25, 1928  [facility] CM/SW Contact:  Baldemar Lenis, LCSW Phone Number: 07/18/2022, 4:32 PM   Clinical Narrative:   CSW alerted by RN that patient's daughter wanted to speak about discharge. CSW spoke with Larita Fife, she is concerned about patient discharging home alone, asking for some support. CSW discussed home health and patient and daughter in agreement, no preference on agency, just whoever can see him soon. Referral given to [facility], they will put patient on a priority list to see as soon as possible. CSW updated Larita Fife. CSW asked MD for orders. No further TOC needs.    Final next level of care: Home w Home Health Services Barriers to Discharge: Barriers Resolved   Patient Goals and CMS Choice CMS Medicare.gov Compare Post Acute Care list provided to:: Patient Choice offered to / list presented to : Patient  Discharge Placement                  Patient to be transferred to facility by: Family Name of family member notified: Larita Fife Patient and family notified of of transfer: 07/18/22  Discharge Plan and Services Additional resources added to the After Visit Summary for                            [facility] Arranged: PT, Nurse's Aide [facility] Agency: [facility] Date [facility] Agency Contacted: 07/18/22   Representative spoke with at [facility] Agency: Tresa Endo  Social Determinants of Health (SDOH) Interventions SDOH Screenings   Food Insecurity: No Food Insecurity (07/13/2022)  Housing: Low Risk  (07/13/2022)  Transportation Needs: No Transportation Needs (07/13/2022)  Utilities: Not At Risk (07/13/2022)  Alcohol Screen: Low Risk  (01/27/2022)  Depression (PHQ2-9): Low Risk  (05/26/2022)  Financial Resource Strain: Low Risk  (01/27/2022)  Physical Activity: Inactive (01/27/2022)  Social Connections: Moderately Isolated (01/27/2022)  Stress: No  Stress Concern Present (01/27/2022)  Tobacco Use: Low Risk  (07/12/2022)     Readmission Risk Interventions     No data to display

## 2022-07-18 NOTE — Progress Notes (Signed)
Jaynie Bream to be D/C'd  per MD order.  Discussed with the patient and all questions fully answered.  VSS, Skin clean, dry and intact without evidence of skin break down, no evidence of skin tears noted.  IV catheter discontinued intact. Site without signs and symptoms of complications. Dressing and pressure applied.  An After Visit Summary was printed and given to the patient.   D/c education completed with patient/family including follow up instructions, medication list, d/c activities limitations if indicated, with other d/c instructions as indicated by MD - patient able to verbalize understanding, all questions fully answered.   Patient instructed to return to ED, call 911, or call MD for any changes in condition.   Patient to be escorted via WC, and D/C home via private auto.

## 2022-07-18 NOTE — Discharge Summary (Signed)
Physician Discharge Summary  [name] MRN: [id number] DOB/AGE: [birth year] 89 y.o.  Admit date: 07/12/2022 Discharge date: 07/18/2022  Admission Diagnoses:  Discharge Diagnoses:  Principal Problem: Cholangitis/abdominal pain Active Problems:   Essential hypertension   Mixed hyperlipidemia   Nausea   Transaminitis   Elevated lipase   Bacteremia secondary to Klebsiella pneumonia.    Liver Cysts   Renal Cysts, multiple   Left renal mass, concerning for possible Malignancy.   Gallbladder distention   Cholelithiasis  Discharged Condition: stable  Hospital Course:  Patient is a 89 year old Caucasian male past medical history significant for prostate cancer, hypertension, PVCs, anemia and hyperlipidemia.  Patient presented to [facility] emergency room with abdominal pain, chills with associated nausea and vomiting.  On presentation to [facility], AST was 387, ALT of 217, alkaline phosphatase of 19, lipase of 65, BUN of 28 and serum creatinine of 1.27.  CT scan of the abdomen and pelvis with contrast done at [facility] revealed mild gallbladder distention and intrahepatic biliary ductal dilatation and multiple exophytic enhancing masses arising from the lower pole of the left kidney suspicious for multiple renal neoplasms.  GI team at [facility] advised that patient be transferred to [facility] for MRCP of the abdomen.  MRCP of the abdomen revealed possible 3 mm distal common bile duct stone, numerous bilateral Bosniak class I and 2 kidney cysts and 3 enhancing lesions arising off the left kidney which measures up to 1.5 cm.  These lesions were said to be concerning for small renal cell carcinomas.  Patient will follow-up with urology team on discharge for further workup of possible renal cancer.  Blood culture grew Klebsiella pneumonia.  Patient was treated with IV Rocephin during the hospital stay.  Patient will be discharged home on  Augmentin orally.  Case was discussed with the infectious disease team.  During the hospital stay, the GI team and interventional radiology team were also consulted to assist with patient's management.  Patient has been optimized.  Patient be discharged back onto the care of the primary care provider.   Abdominal pain/cholangitis/cholelithiasis: -Blood culture.  Klebsiella pneumonia. -CT scan of abdomen and pelvis with contrast and MRI of the abdomen is as documented below. -Abdominal pain has resolved. -Patient was managed with IV Rocephin during this hospital stay. -Patient be discharged back home on oral Augmentin. -Follow-up with PCP and GI team on discharge.    Common bile duct stone: -Suspicion the patient may have passed a stone. -LFTs trended during the hospital stay. -LFTs have continued to trend downward. -PCP to repeat CMP in 1 to 2 weeks.  Left renal masses: -Concerning for possible malignancy. -Follow-up with urology on discharge.   Klebsiella Bacteremia:  2/2 blood cultures positive for Klebsiella pneumoniae.  The patient is receiving Rocephin 2 gm IV daily. Start oral Augmentin on discharge.  Complete course of antibiotics.    Elevated LFTs: -LFTs continue to trend downwards. -Follow-up with PCP and GI on discharge.    Hypertension: -Continue to monitor closely.   Chronic kidney disease stage IIIa: -Stable. -Continue to monitor closely.     Consults: GI  Significant Diagnostic Studies:  CT abdomen and pelvis with contrast revealed: 1. Extensive multi-vessel coronary artery calcification. 2. Focally dilated extrahepatic main portal vein measuring 3.8 x 4.6 cm in diameter spanning a length of 5.2 cm. The nominal diameter of the portal vein is 1.7 cm proximal to the area of focal dilation. No intraluminal thrombus  or perivascular inflammatory stranding or fluid collection identified. 3. Mild gallbladder distension and intrahepatic biliary ductal dilation  of the level of the dilated main portal vein which demonstrates mass effect upon the proximal extrahepatic bile duct. The distal extrahepatic bile duct is of normal caliber. Correlation with liver enzymes may be helpful to exclude an obstructive process. 4. Multiple exophytic enhancing masses arising from the lower pole the left kidney suspicious for multiple renal neoplasms. If indicated, dedicated renal mass protocol CT or MRI examination is recommended for further evaluation. 5. Status post prostatectomy. 6. Status post bilateral laminectomy and resection of the spinous processes of L2 and L3. Large calcified posterior disc herniations at these levels resultant severe canal stenosis.   Aortic Atherosclerosis  MRI abdomen with and without contrast/MRCP revealed: 1. Exam detail is significantly diminished secondary to motion artifact. 2. There is abrupt cut off of the distal CBD at the level of the head of pancreas where a possible 3 mm distal common bile duct stone is noted. No significant bile duct dilatation. Correlate for any clinical signs/symptoms of choledocholithiasis. 3. 8 mm choledochal cyst arises off the common bile duct. 4. Aneurysmal dilatation of the portal vein as noted on the exam from earlier today. No portal venous thrombosis identified. 5. Numerous bilateral Bosniak class 1 and 2 kidney cysts. 6. There are 3 enhancing lesions arising off the inferior pole of the left kidney which measure up to 1.5 cm. These are concerning for small renal cell carcinomas. No signs solid organ or nodal metastasis within the abdomen. 7. Small right pleural effusion.  Blood culture grew: Klebsiella pneumonia   Discharge Exam: Blood pressure 118/72, pulse 74, temperature 97.6 F (36.4 C), temperature source Oral, resp. rate 16, height 5\' 10"  (1.778 m), weight 61.2 kg, SpO2 99 %.   Disposition: Discharge disposition: 01-Home or Self Care       Discharge Instructions      Diet - low sodium heart healthy   Complete by: As directed    Increase activity slowly   Complete by: As directed       Allergies as of 07/18/2022       Reactions   Lipitor [atorvastatin] Other (See Comments)   myalgia        Medication List     STOP taking these medications    rosuvastatin 5 MG tablet Commonly known as: CRESTOR       TAKE these medications    acetaminophen 325 MG tablet Commonly known as: TYLENOL Take 325 mg by mouth 2 (two) times daily as needed for moderate pain.   amLODipine 10 MG tablet Commonly known as: NORVASC TAKE 1 TABLET BY MOUTH EVERY DAY   amoxicillin-clavulanate 875-125 MG tablet Commonly known as: AUGMENTIN Take 1 tablet by mouth 2 (two) times daily for 10 days.   ascorbic acid 500 MG tablet Commonly known as: VITAMIN C Take 1 tablet (500 mg total) by mouth daily. What changed: when to take this   aspirin 81 MG tablet Take 81 mg by mouth every Monday, Wednesday, and Friday.   benazepril 20 MG tablet Commonly known as: LOTENSIN TAKE 1 TABLET BY MOUTH EVERY DAY   Cholecalciferol 25 MCG (1000 UT) capsule Take 1,000 Units by mouth daily.   FISH OIL PO Take 1 tablet by mouth daily.   OneTouch Ultra test strip Generic drug: glucose blood CHECK BLOOD SUGAR EVERY DAY AND AS NEEDED        Time spent: 35 minutes.  Signed: [place] Dragon  Keddrick Wyne 07/18/2022, 4:15 PM

## 2022-07-18 NOTE — TOC CM/SW Note (Signed)
Transition of Care [facility]) - Inpatient Brief Assessment   Patient Details  Name: Benjamin Buchanan MRN: [id number] Date of Birth: [birth year]  Transition of Care [facility]) CM/SW Contact:    Baldemar Lenis, LCSW Phone Number: 07/18/2022, 3:17 PM   Clinical Narrative:  Patient from home alone, independent, no needs identified at this time. Please consult TOC if new need arises.    Transition of Care Asessment: Insurance and Status: Insurance coverage has been reviewed Patient has primary care physician: Yes Home environment has been reviewed: Home alone Prior level of function:: Independent Prior/Current Home Services: No current home services Social Determinants of Health Reivew: SDOH reviewed no interventions necessary Readmission risk has been reviewed: Yes Transition of care needs: no transition of care needs at this time

## 2022-07-19 DIAGNOSIS — E782 Mixed hyperlipidemia: Secondary | ICD-10-CM | POA: Diagnosis not present

## 2022-07-19 DIAGNOSIS — K8030 Calculus of bile duct with cholangitis, unspecified, without obstruction: Secondary | ICD-10-CM | POA: Diagnosis not present

## 2022-07-19 DIAGNOSIS — D631 Anemia in chronic kidney disease: Secondary | ICD-10-CM | POA: Diagnosis not present

## 2022-07-19 DIAGNOSIS — N2889 Other specified disorders of kidney and ureter: Secondary | ICD-10-CM | POA: Diagnosis not present

## 2022-07-19 DIAGNOSIS — K828 Other specified diseases of gallbladder: Secondary | ICD-10-CM | POA: Diagnosis not present

## 2022-07-19 DIAGNOSIS — K8036 Calculus of bile duct with acute and chronic cholangitis without obstruction: Secondary | ICD-10-CM | POA: Diagnosis not present

## 2022-07-19 DIAGNOSIS — I447 Left bundle-branch block, unspecified: Secondary | ICD-10-CM | POA: Diagnosis not present

## 2022-07-19 DIAGNOSIS — Z7982 Long term (current) use of aspirin: Secondary | ICD-10-CM | POA: Diagnosis not present

## 2022-07-19 DIAGNOSIS — E559 Vitamin D deficiency, unspecified: Secondary | ICD-10-CM | POA: Diagnosis not present

## 2022-07-19 DIAGNOSIS — B961 Klebsiella pneumoniae [K. pneumoniae] as the cause of diseases classified elsewhere: Secondary | ICD-10-CM | POA: Diagnosis not present

## 2022-07-19 DIAGNOSIS — R32 Unspecified urinary incontinence: Secondary | ICD-10-CM | POA: Diagnosis not present

## 2022-07-19 DIAGNOSIS — M5137 Other intervertebral disc degeneration, lumbosacral region: Secondary | ICD-10-CM | POA: Diagnosis not present

## 2022-07-19 DIAGNOSIS — N1831 Chronic kidney disease, stage 3a: Secondary | ICD-10-CM | POA: Diagnosis not present

## 2022-07-19 DIAGNOSIS — R7881 Bacteremia: Secondary | ICD-10-CM | POA: Diagnosis not present

## 2022-07-19 DIAGNOSIS — K8020 Calculus of gallbladder without cholecystitis without obstruction: Secondary | ICD-10-CM | POA: Diagnosis not present

## 2022-07-19 DIAGNOSIS — Z9181 History of falling: Secondary | ICD-10-CM | POA: Diagnosis not present

## 2022-07-19 DIAGNOSIS — I129 Hypertensive chronic kidney disease with stage 1 through stage 4 chronic kidney disease, or unspecified chronic kidney disease: Secondary | ICD-10-CM | POA: Diagnosis not present

## 2022-07-19 DIAGNOSIS — K7689 Other specified diseases of liver: Secondary | ICD-10-CM | POA: Diagnosis not present

## 2022-07-19 DIAGNOSIS — I700 Atherosclerosis of aorta: Secondary | ICD-10-CM | POA: Diagnosis not present

## 2022-07-19 DIAGNOSIS — I493 Ventricular premature depolarization: Secondary | ICD-10-CM | POA: Diagnosis not present

## 2022-07-19 DIAGNOSIS — Z556 Problems related to health literacy: Secondary | ICD-10-CM | POA: Diagnosis not present

## 2022-07-19 DIAGNOSIS — Z8546 Personal history of malignant neoplasm of prostate: Secondary | ICD-10-CM | POA: Diagnosis not present

## 2022-07-19 LAB — CULTURE, BLOOD (ROUTINE X 2)

## 2022-07-20 LAB — CULTURE, BLOOD (ROUTINE X 2)

## 2022-07-21 ENCOUNTER — Telehealth: Payer: Self-pay

## 2022-07-21 LAB — CULTURE, BLOOD (ROUTINE X 2): Culture: NO GROWTH

## 2022-07-21 NOTE — Transitions of Care (Post Inpatient/ED Visit) (Signed)
07/21/2022  Name: Benjamin Buchanan MRN: [id number] DOB: Aug 21, 1928  Today's TOC FU Call Status: Today's TOC FU Call Status:: Successful TOC FU Call Competed TOC FU Call Complete Date: 07/21/22  Transition Care Management Follow-up Telephone Call Date of Discharge: 07/18/22 Discharge Facility: [facility]) Type of Discharge: Inpatient Admission Primary Inpatient Discharge Diagnosis:: Cholangitis How have you been since you were released from the hospital?: Better Any questions or concerns?: No  Items Reviewed: Did you receive and understand the discharge instructions provided?: Yes Medications obtained,verified, and reconciled?: Yes (Medications Reviewed) Any new allergies since your discharge?: No Dietary orders reviewed?: Yes Type of Diet Ordered:: Lwo sodium, heart healthy Do you have support at home?: Yes People in Home: child(ren), adult Name of Support/Comfort Primary Source: Larita Fife  Medications Reviewed Today: Medications Reviewed Today     Reviewed by Jodelle Gross, RN (Case Manager) on 07/21/22 at [phone]  Med List Status: <None>   Medication Order Taking? Sig Documenting Provider Last Dose Status Informant  acetaminophen (TYLENOL) 325 MG tablet [id number] Yes Take 325 mg by mouth 2 (two) times daily as needed for moderate pain. [provider] Taking Active Self, Pharmacy Records  amLODipine (NORVASC) 10 MG tablet [id number] Yes TAKE 1 TABLET BY MOUTH EVERY DAY Hawks, Edilia Bo, FNP Taking Active Self, Pharmacy Records  amoxicillin-clavulanate (AUGMENTIN) 875-125 MG tablet [id number] Yes Take 1 tablet by mouth 2 (two) times daily for 10 days. Barnetta Chapel, MD Taking Active   ascorbic acid (VITAMIN C) 500 MG tablet [id number] Yes Take 1 tablet (500 mg total) by mouth daily. Barnetta Chapel, MD Taking Active   aspirin 81 MG tablet [id number] Yes Take 81 mg by mouth every Monday, Wednesday, and Friday.  [provider] Taking Active Self, Pharmacy  Records  benazepril (LOTENSIN) 20 MG tablet [id number] Yes TAKE 1 TABLET BY MOUTH EVERY DAY [name], FNP Taking Active Self, Pharmacy Records  Cholecalciferol 1000 UNITS capsule [id number] Yes Take 1,000 Units by mouth daily. [provider] Taking Active Self, Pharmacy Records  Omega-3 Fatty Acids (FISH OIL PO) [id number] Yes Take 1 tablet by mouth daily. [provider] Taking Active Self, Pharmacy Records  [facility] ULTRA test strip [id number] Yes CHECK BLOOD SUGAR EVERY DAY AND AS NEEDED [name], FNP Taking Active Self, Pharmacy Records            Home Care and Equipment/Supplies: Were Home Health Services Ordered?: Yes Name of Home Health Agency:: [facility] Has Agency set up a time to come to your home?: Yes First Home Health Visit Date: 07/20/22 Any new equipment or medical supplies ordered?: No  Functional Questionnaire: Do you need assistance with bathing/showering or dressing?: No Do you need assistance with meal preparation?: No Do you need assistance with eating?: No Do you have difficulty maintaining continence: No Do you need assistance with getting out of bed/getting out of a chair/moving?: No Do you have difficulty managing or taking your medications?: No  Follow up appointments reviewed: PCP Follow-up appointment confirmed?: No MD Provider Line Number:[phone] Given: No (Patient prefers to call for appt.) Specialist Hospital Follow-up appointment confirmed?: NA Do you need transportation to your follow-up appointment?: No Do you understand care options if your condition(s) worsen?: Yes-patient verbalized understanding  SDOH Interventions Today    Flowsheet Row Most Recent Value  SDOH Interventions   Food Insecurity Interventions Intervention Not Indicated  Housing Interventions Intervention Not Indicated  Transportation Interventions Intervention Not Indicated      Interventions Today  Flowsheet Row Most Recent Value   Chronic Disease   Chronic disease during today's visit Hypertension (HTN), Chronic Kidney Disease/End Stage Renal Disease (ESRD)  General Interventions   General Interventions Discussed/Reviewed General Interventions Discussed, General Interventions Reviewed  Nutrition Interventions   Nutrition Discussed/Reviewed Nutrition Discussed        Jodelle Gross, RN, BSN, CCM Care Management Coordinator [facility] Phone: [phone]/Fax: [phone]

## 2022-07-22 ENCOUNTER — Telehealth: Payer: Self-pay | Admitting: Family

## 2022-07-22 NOTE — Telephone Encounter (Signed)
PT - 1 time a week for 5 weeks

## 2022-07-22 NOTE — Telephone Encounter (Signed)
VERBAL OKAY FOR PT LEFT ON vm

## 2022-07-24 DIAGNOSIS — R32 Unspecified urinary incontinence: Secondary | ICD-10-CM | POA: Diagnosis not present

## 2022-07-24 DIAGNOSIS — K7689 Other specified diseases of liver: Secondary | ICD-10-CM | POA: Diagnosis not present

## 2022-07-24 DIAGNOSIS — E782 Mixed hyperlipidemia: Secondary | ICD-10-CM | POA: Diagnosis not present

## 2022-07-24 DIAGNOSIS — D631 Anemia in chronic kidney disease: Secondary | ICD-10-CM | POA: Diagnosis not present

## 2022-07-24 DIAGNOSIS — Z556 Problems related to health literacy: Secondary | ICD-10-CM | POA: Diagnosis not present

## 2022-07-24 DIAGNOSIS — K828 Other specified diseases of gallbladder: Secondary | ICD-10-CM | POA: Diagnosis not present

## 2022-07-24 DIAGNOSIS — Z9181 History of falling: Secondary | ICD-10-CM | POA: Diagnosis not present

## 2022-07-24 DIAGNOSIS — B961 Klebsiella pneumoniae [K. pneumoniae] as the cause of diseases classified elsewhere: Secondary | ICD-10-CM | POA: Diagnosis not present

## 2022-07-24 DIAGNOSIS — N1831 Chronic kidney disease, stage 3a: Secondary | ICD-10-CM | POA: Diagnosis not present

## 2022-07-24 DIAGNOSIS — E559 Vitamin D deficiency, unspecified: Secondary | ICD-10-CM | POA: Diagnosis not present

## 2022-07-24 DIAGNOSIS — R7881 Bacteremia: Secondary | ICD-10-CM | POA: Diagnosis not present

## 2022-07-24 DIAGNOSIS — Z8546 Personal history of malignant neoplasm of prostate: Secondary | ICD-10-CM | POA: Diagnosis not present

## 2022-07-24 DIAGNOSIS — M5137 Other intervertebral disc degeneration, lumbosacral region: Secondary | ICD-10-CM | POA: Diagnosis not present

## 2022-07-24 DIAGNOSIS — I447 Left bundle-branch block, unspecified: Secondary | ICD-10-CM | POA: Diagnosis not present

## 2022-07-24 DIAGNOSIS — I700 Atherosclerosis of aorta: Secondary | ICD-10-CM | POA: Diagnosis not present

## 2022-07-24 DIAGNOSIS — N2889 Other specified disorders of kidney and ureter: Secondary | ICD-10-CM | POA: Diagnosis not present

## 2022-07-24 DIAGNOSIS — K8030 Calculus of bile duct with cholangitis, unspecified, without obstruction: Secondary | ICD-10-CM | POA: Diagnosis not present

## 2022-07-24 DIAGNOSIS — K8020 Calculus of gallbladder without cholecystitis without obstruction: Secondary | ICD-10-CM | POA: Diagnosis not present

## 2022-07-24 DIAGNOSIS — I493 Ventricular premature depolarization: Secondary | ICD-10-CM | POA: Diagnosis not present

## 2022-07-24 DIAGNOSIS — Z7982 Long term (current) use of aspirin: Secondary | ICD-10-CM | POA: Diagnosis not present

## 2022-07-24 DIAGNOSIS — I129 Hypertensive chronic kidney disease with stage 1 through stage 4 chronic kidney disease, or unspecified chronic kidney disease: Secondary | ICD-10-CM | POA: Diagnosis not present

## 2022-07-25 DIAGNOSIS — R748 Abnormal levels of other serum enzymes: Secondary | ICD-10-CM | POA: Diagnosis not present

## 2022-07-29 ENCOUNTER — Ambulatory Visit (INDEPENDENT_AMBULATORY_CARE_PROVIDER_SITE_OTHER): Payer: Medicare HMO

## 2022-07-29 DIAGNOSIS — I493 Ventricular premature depolarization: Secondary | ICD-10-CM

## 2022-07-29 DIAGNOSIS — M5137 Other intervertebral disc degeneration, lumbosacral region: Secondary | ICD-10-CM | POA: Diagnosis not present

## 2022-07-29 DIAGNOSIS — K828 Other specified diseases of gallbladder: Secondary | ICD-10-CM | POA: Diagnosis not present

## 2022-07-29 DIAGNOSIS — R7881 Bacteremia: Secondary | ICD-10-CM | POA: Diagnosis not present

## 2022-07-29 DIAGNOSIS — Z9181 History of falling: Secondary | ICD-10-CM | POA: Diagnosis not present

## 2022-07-29 DIAGNOSIS — K8020 Calculus of gallbladder without cholecystitis without obstruction: Secondary | ICD-10-CM

## 2022-07-29 DIAGNOSIS — D631 Anemia in chronic kidney disease: Secondary | ICD-10-CM

## 2022-07-29 DIAGNOSIS — Z7982 Long term (current) use of aspirin: Secondary | ICD-10-CM | POA: Diagnosis not present

## 2022-07-29 DIAGNOSIS — I700 Atherosclerosis of aorta: Secondary | ICD-10-CM | POA: Diagnosis not present

## 2022-07-29 DIAGNOSIS — B961 Klebsiella pneumoniae [K. pneumoniae] as the cause of diseases classified elsewhere: Secondary | ICD-10-CM

## 2022-07-29 DIAGNOSIS — I129 Hypertensive chronic kidney disease with stage 1 through stage 4 chronic kidney disease, or unspecified chronic kidney disease: Secondary | ICD-10-CM

## 2022-07-29 DIAGNOSIS — N1831 Chronic kidney disease, stage 3a: Secondary | ICD-10-CM | POA: Diagnosis not present

## 2022-07-29 DIAGNOSIS — I447 Left bundle-branch block, unspecified: Secondary | ICD-10-CM

## 2022-07-29 DIAGNOSIS — N2889 Other specified disorders of kidney and ureter: Secondary | ICD-10-CM | POA: Diagnosis not present

## 2022-07-29 DIAGNOSIS — K7689 Other specified diseases of liver: Secondary | ICD-10-CM | POA: Diagnosis not present

## 2022-07-29 DIAGNOSIS — E782 Mixed hyperlipidemia: Secondary | ICD-10-CM | POA: Diagnosis not present

## 2022-07-29 DIAGNOSIS — Z556 Problems related to health literacy: Secondary | ICD-10-CM | POA: Diagnosis not present

## 2022-07-29 DIAGNOSIS — K8030 Calculus of bile duct with cholangitis, unspecified, without obstruction: Secondary | ICD-10-CM

## 2022-07-29 DIAGNOSIS — R32 Unspecified urinary incontinence: Secondary | ICD-10-CM | POA: Diagnosis not present

## 2022-07-29 DIAGNOSIS — Z8546 Personal history of malignant neoplasm of prostate: Secondary | ICD-10-CM | POA: Diagnosis not present

## 2022-07-29 DIAGNOSIS — E559 Vitamin D deficiency, unspecified: Secondary | ICD-10-CM | POA: Diagnosis not present

## 2022-07-30 ENCOUNTER — Telehealth: Payer: Self-pay | Admitting: Family

## 2022-07-30 NOTE — Telephone Encounter (Signed)
Sister is worried about him driving due to medical problems.  She states patient wants to drive but she does not think he should be driving.  A few weeks ago he went to see a friend in [place] and he didn't know how how to get there.  Sister would like to know if you can tell him not to drive until we can get lab results back?  She is worried he may hurt hisself or someone else. She would to keep him off as long as she can.

## 2022-07-31 ENCOUNTER — Encounter: Payer: Self-pay | Admitting: Family

## 2022-07-31 ENCOUNTER — Ambulatory Visit (INDEPENDENT_AMBULATORY_CARE_PROVIDER_SITE_OTHER): Payer: Medicare HMO | Admitting: Family

## 2022-07-31 VITALS — BP 165/78 | HR 66 | Temp 97.5°F | Ht 70.0 in | Wt 128.4 lb

## 2022-07-31 DIAGNOSIS — I10 Essential (primary) hypertension: Secondary | ICD-10-CM

## 2022-07-31 DIAGNOSIS — N289 Disorder of kidney and ureter, unspecified: Secondary | ICD-10-CM

## 2022-07-31 DIAGNOSIS — Z09 Encounter for follow-up examination after completed treatment for conditions other than malignant neoplasm: Secondary | ICD-10-CM

## 2022-07-31 DIAGNOSIS — K8309 Other cholangitis: Secondary | ICD-10-CM | POA: Diagnosis not present

## 2022-07-31 LAB — CMP14+EGFR
ALT: 25 IU/L (ref 0–44)
AST: 17 IU/L (ref 0–40)
Albumin: 3.9 g/dL (ref 3.6–4.6)
Alkaline Phosphatase: 108 IU/L (ref 44–121)
BUN/Creatinine Ratio: 24 (ref 10–24)
BUN: 30 mg/dL (ref 10–36)
Bilirubin Total: 0.3 mg/dL (ref 0.0–1.2)
CO2: 20 mmol/L (ref 20–29)
Calcium: 9.7 mg/dL (ref 8.6–10.2)
Chloride: 107 mmol/L — ABNORMAL HIGH (ref 96–106)
Creatinine, Ser: 1.27 mg/dL (ref 0.76–1.27)
Globulin, Total: 2 g/dL (ref 1.5–4.5)
Glucose: 90 mg/dL (ref 70–99)
Potassium: 5.1 mmol/L (ref 3.5–5.2)
Sodium: 144 mmol/L (ref 134–144)
Total Protein: 5.9 g/dL — ABNORMAL LOW (ref 6.0–8.5)
eGFR: 53 mL/min/{1.73_m2} — ABNORMAL LOW (ref >59)

## 2022-07-31 LAB — CBC WITH DIFFERENTIAL/PLATELET
Basophils Absolute: 0.1 10*3/uL (ref 0.0–0.2)
Basos: 1 %
EOS (ABSOLUTE): 0.2 10*3/uL (ref 0.0–0.4)
Eos: 3 %
Hematocrit: 34 % — ABNORMAL LOW (ref 37.5–51.0)
Hemoglobin: 10.5 g/dL — ABNORMAL LOW (ref 13.0–17.7)
Immature Grans (Abs): 0 10*3/uL (ref 0.0–0.1)
Immature Granulocytes: 0 %
Lymphocytes Absolute: 1.4 10*3/uL (ref 0.7–3.1)
Lymphs: 20 %
MCH: 30.8 pg (ref 26.6–33.0)
MCHC: 30.9 g/dL — ABNORMAL LOW (ref 31.5–35.7)
MCV: 100 fL — ABNORMAL HIGH (ref 79–97)
Monocytes Absolute: 0.7 10*3/uL (ref 0.1–0.9)
Monocytes: 9 %
Neutrophils Absolute: 4.8 10*3/uL (ref 1.4–7.0)
Neutrophils: 67 %
Platelets: 395 10*3/uL (ref 150–450)
RBC: 3.41 x10E6/uL — ABNORMAL LOW (ref 4.14–5.80)
RDW: 12.9 % (ref 11.6–15.4)
WBC: 7.2 10*3/uL (ref 3.4–10.8)

## 2022-07-31 NOTE — Progress Notes (Signed)
Subjective:    [name], male    DOB: 11/09/1928, 89 y.o.   MRN: [id number]  Chief Complaint  Patient presents with   Hospitalization Follow-up     HPI Today's visit was for Transitional Care Management.  The patient was discharged from [facility] on 07/18/22 with a primary diagnosis of cholangitis.    Contact with the patient and/or caregiver, by a clinical staff member, was made on 07/21/22 and was documented as a telephone encounter within the EMR.  Through chart review and discussion with the patient I have determined that management of their condition is of high complexity.    "CT scan of the abdomen and pelvis with contrast done at [facility] revealed mild gallbladder distention and intrahepatic biliary ductal dilatation and multiple exophytic enhancing masses arising from the lower pole of the left kidney suspicious for multiple renal neoplasms.  GI team at [facility] advised that patient be transferred to [facility] for MRCP of the abdomen.  MRCP of the abdomen revealed possible 3 mm distal common bile duct stone, numerous bilateral Bosniak class I and 2 kidney cysts and 3 enhancing lesions arising off the left kidney which measures up to 1.5 cm.  These lesions were said to be concerning for small renal cell carcinomas.  Patient will follow-up with urology team on discharge for further workup of possible renal cancer.  Blood culture grew Klebsiella pneumonia.  Patient was treated with IV Rocephin during the hospital stay.  Patient will be discharged home on Augmentin orally. "  He completed Augmentin. Denies any abdominal pain, fevers,  and dysuria at this time.   His BP is elevated today. Last visit was at goal. Does not monitor BP at home. He has taken his meds this morning.   Review of Systems  All other systems reviewed and are negative.  Family History  Problem Relation Age of Onset   Cancer Father        prostate cancer    Hypertension Other        family history   Social History   Socioeconomic History   Marital status: Widowed    Spouse name: Not on file   Number of children: 2   Years of education: 10   Highest education level: 10th grade  Occupational History   Occupation: retired  Tobacco Use   Smoking status: Never   Smokeless tobacco: Never  Vaping Use   Vaping status: Never Used  Substance and Sexual Activity   Alcohol use: No   Drug use: No   Sexual activity: Not Currently  Other Topics Concern   Not on file  Social History Narrative   Retired. Lives alone. No family nearby, but he eats out for breakfast and dinner with friends several times per week. Stays active. Still very independent 11/2020   Social Determinants of Health   Financial Resource Strain: Low Risk  (01/27/2022)   Overall Financial Resource Strain (CARDIA)    Difficulty of Paying Living Expenses: Not hard at all  Food Insecurity: No Food Insecurity (07/21/2022)   Hunger Vital Sign    Worried About Running Out of Food in the Last Year: Never true    Ran Out of Food in the Last Year: Never true  Transportation Needs: No Transportation Needs (07/21/2022)   PRAPARE - Administrator, Civil Service (Medical): No    Lack of Transportation (Non-Medical): No  Physical Activity: Inactive (01/27/2022)   Exercise  Vital Sign    Days of Exercise per Week: 0 days    Minutes of Exercise per Session: 0 min  Stress: No Stress Concern Present (01/27/2022)   [employer] of Occupational Health - Occupational Stress Questionnaire    Feeling of Stress : Not at all  Social Connections: Moderately Isolated (01/27/2022)   Social Connection and Isolation Panel [NHANES]    Frequency of Communication with Friends and Family: More than three times a week    Frequency of Social Gatherings with Friends and Family: Three times a week    Attends Religious Services: Never    Active Member of Clubs or Organizations: Yes    Attends  Banker Meetings: More than 4 times per year    Marital Status: Widowed        Objective:   Physical Exam Vitals reviewed.  Constitutional:      General: He is not in acute distress.    Appearance: He is well-developed and underweight.  HENT:     Head: Normocephalic.     Right Ear: External ear normal.     Left Ear: External ear normal.  Eyes:     General:        Right eye: No discharge.        Left eye: No discharge.     Pupils: Pupils are equal, round, and reactive to light.  Neck:     Thyroid: No thyromegaly.  Cardiovascular:     Rate and Rhythm: Normal rate and regular rhythm.     Heart sounds: Normal heart sounds. No murmur heard. Pulmonary:     Effort: Pulmonary effort is normal. No respiratory distress.     Breath sounds: Normal breath sounds. No wheezing.  Abdominal:     General: Bowel sounds are normal. There is no distension.     Palpations: Abdomen is soft.     Tenderness: There is no abdominal tenderness.  Musculoskeletal:        General: No tenderness. Normal range of motion.     Cervical back: Normal range of motion and neck supple.  Skin:    General: Skin is warm and dry.     Findings: No erythema or rash.  Neurological:     Mental Status: He is alert and oriented to person, place, and time.     Cranial Nerves: No cranial nerve deficit.     Deep Tendon Reflexes: Reflexes are normal and symmetric.  Psychiatric:        Behavior: Behavior normal.        Thought Content: Thought content normal.        Judgment: Judgment normal.    BP (!) 165/78   Pulse 66   Temp (!) 97.5 F (36.4 C) (Temporal)   Ht 5\' 10"  (1.778 m)   Wt 128 lb 6.4 oz (58.2 kg)   SpO2 95%   BMI 18.42 kg/m      Assessment & Plan:  Benjamin Buchanan comes in today with chief complaint of Hospitalization Follow-up   Diagnosis and orders addressed:  1. Cholangitis - Ambulatory referral to Gastroenterology - CBC with Differential/Platelet - CMP14+EGFR  2.  Hospital discharge follow-up - CBC with Differential/Platelet - CMP14+EGFR  3. Renal lesion - Ambulatory referral to Urology - CBC with Differential/Platelet - CMP14+EGFR  4. Essential hypertension -Pt will monitor BP at home  Last visit was at goal If elevated on next visit may need to adjust medications    Labs pending Continue medications  Discussed  about holding off on driving per familys concerns.  Referral to Urologists and GI pending  Discussed possible renal cancer Health Maintenance reviewed Diet and exercise encouraged  Follow up plan: Keep chronic follow up   [name], FNP

## 2022-07-31 NOTE — Telephone Encounter (Signed)
Discussed at visit.   Evelina Dun, FNP

## 2022-07-31 NOTE — Patient Instructions (Signed)
Kidney Cancer  Kidney cancer is a type of cancer in which an abnormal growth of cells (tumor) forms in one or both kidneys. The kidneys filter waste from your blood and make urine. Kidney cancer may spread to other parts of your body. This type of cancer may also be called renal cell carcinoma. What are the causes? The cause of this condition is not known. What increases the risk? You may be more likely to develop kidney cancer if: You are over age 60. You have certain conditions that are passed from parent to child (inherited). These include von Hippel-Lindau disease, tuberous sclerosis, and hereditary papillary renal carcinoma. You smoke or have been exposed to certain chemicals. You have advanced kidney disease, especially if you need to use a machine to clean your blood (dialysis). You are obese. You have high blood pressure (hypertension). You are male. What are the signs or symptoms? At first, kidney cancer may not cause any symptoms. As the cancer grows, symptoms may include: Blood in the urine. Pain in the upper back or abdomen, just below the rib cage. You may feel pain on one or both sides of your body. Tiredness (fatigue). Weight loss that cannot be explained. Fever. How is this diagnosed? This condition may be diagnosed based on: Your symptoms. Your medical history. A physical exam. You may also have tests, such as: Blood and urine tests. X-rays. Imaging tests, such as CT scans, MRIs, and PET scans. Angiogram. This is when dye is injected into your blood to show your blood vessels. Intravenous pyelogram. This is when dye is injected into your blood to show your kidneys and the other organs that help with making and storing urine. Biopsy. This is when a piece of tissue is removed from your kidney and looked at under a microscope. Your cancer will be assessed (staged), based on how severe it is and how much it has spread. How is this treated? Treatment depends on the type  and stage of the cancer. Treatment may include: Surgery to remove: Just the tumor (nephron-sparing surgery). The entire kidney (nephrectomy). The kidney, some of the healthy tissue around it, nearby lymph nodes, and sometimes the adrenal gland (radical nephrectomy). Medicines to: Kill cancer cells (chemotherapy). Help your body's disease-fighting system (immune system) fight cancer cells. This is known as immunotherapy. Radiation therapy. This uses high-energy rays to kill cancer cells. Targeted therapy to only attack genes and proteins that allow a tumor to grow. This type of therapy tries to limit damage to your healthy cells. Cryoablation. This uses gas or liquid to freeze cancer cells. It is sent through a needle. Radiofrequency ablation. This uses high-energy radio waves to destroy cancer cells. It is sent through a needle-like probe. Embolization. This is a procedure to block the artery that supplies blood to the tumor. Follow these instructions at home: Eating and drinking Some of your treatments may affect your appetite and your ability to chew and swallow. If you have problems eating, or if you do not have an appetite, meet with an expert in diet and nutrition (dietitian). If you have side effects that affect eating, it may help to: Eat smaller meals more often. Eat bland or soft foods. Avoid foods that are hot, spicy, or hard to swallow. Drink shakes or supplements that are high in nutrition and calories. Do not drink alcohol. Lifestyle Do not use any products that contain nicotine or tobacco. These products include cigarettes, chewing tobacco, and vaping devices, such as e-cigarettes. If you need   help quitting, ask your health care provider. Get enough sleep. Most adults need 6-8 hours of sleep each night. During treatment, you may need more sleep. General instructions Take over-the-counter and prescription medicines only as told by your health care provider. Consider joining a  support group. This can help you cope with the stress of having kidney cancer. Work with your health care provider to manage any side effects of treatment. Keep all follow-up visits. Your health care provider will want to make sure your treatment is working. Where to find more information American Cancer Society: cancer.org National Cancer Institute (NCI): cancer.gov Contact a health care provider if: You bruise or bleed easily. You lose weight without trying. You have new or worse fatigue or weakness. You have a fever. Your pain suddenly increases. You have more blood in your urine. Your skin or the white parts of your eyes turn yellow (jaundice). Get help right away if: You have chest pain. You are short of breath. You have irregular heartbeats (palpitations). These symptoms may be an emergency. Get help right away. Call 911. Do not wait to see if the symptoms will go away. Do not drive yourself to the hospital. This information is not intended to replace advice given to you by your health care provider. Make sure you discuss any questions you have with your health care provider. Document Revised: 06/11/2021 Document Reviewed: 06/11/2021 Elsevier Patient Education  2024 Elsevier Inc.  

## 2022-08-04 DIAGNOSIS — Z8546 Personal history of malignant neoplasm of prostate: Secondary | ICD-10-CM | POA: Diagnosis not present

## 2022-08-04 DIAGNOSIS — I129 Hypertensive chronic kidney disease with stage 1 through stage 4 chronic kidney disease, or unspecified chronic kidney disease: Secondary | ICD-10-CM | POA: Diagnosis not present

## 2022-08-04 DIAGNOSIS — Z556 Problems related to health literacy: Secondary | ICD-10-CM | POA: Diagnosis not present

## 2022-08-04 DIAGNOSIS — K828 Other specified diseases of gallbladder: Secondary | ICD-10-CM | POA: Diagnosis not present

## 2022-08-04 DIAGNOSIS — R7881 Bacteremia: Secondary | ICD-10-CM | POA: Diagnosis not present

## 2022-08-04 DIAGNOSIS — N1831 Chronic kidney disease, stage 3a: Secondary | ICD-10-CM | POA: Diagnosis not present

## 2022-08-04 DIAGNOSIS — D631 Anemia in chronic kidney disease: Secondary | ICD-10-CM | POA: Diagnosis not present

## 2022-08-04 DIAGNOSIS — E559 Vitamin D deficiency, unspecified: Secondary | ICD-10-CM | POA: Diagnosis not present

## 2022-08-04 DIAGNOSIS — N2889 Other specified disorders of kidney and ureter: Secondary | ICD-10-CM | POA: Diagnosis not present

## 2022-08-04 DIAGNOSIS — B961 Klebsiella pneumoniae [K. pneumoniae] as the cause of diseases classified elsewhere: Secondary | ICD-10-CM | POA: Diagnosis not present

## 2022-08-04 DIAGNOSIS — R32 Unspecified urinary incontinence: Secondary | ICD-10-CM | POA: Diagnosis not present

## 2022-08-04 DIAGNOSIS — K7689 Other specified diseases of liver: Secondary | ICD-10-CM | POA: Diagnosis not present

## 2022-08-04 DIAGNOSIS — K8030 Calculus of bile duct with cholangitis, unspecified, without obstruction: Secondary | ICD-10-CM | POA: Diagnosis not present

## 2022-08-04 DIAGNOSIS — M5137 Other intervertebral disc degeneration, lumbosacral region: Secondary | ICD-10-CM | POA: Diagnosis not present

## 2022-08-04 DIAGNOSIS — K8020 Calculus of gallbladder without cholecystitis without obstruction: Secondary | ICD-10-CM | POA: Diagnosis not present

## 2022-08-04 DIAGNOSIS — E782 Mixed hyperlipidemia: Secondary | ICD-10-CM | POA: Diagnosis not present

## 2022-08-04 DIAGNOSIS — I447 Left bundle-branch block, unspecified: Secondary | ICD-10-CM | POA: Diagnosis not present

## 2022-08-04 DIAGNOSIS — I493 Ventricular premature depolarization: Secondary | ICD-10-CM | POA: Diagnosis not present

## 2022-08-04 DIAGNOSIS — Z7982 Long term (current) use of aspirin: Secondary | ICD-10-CM | POA: Diagnosis not present

## 2022-08-04 DIAGNOSIS — I700 Atherosclerosis of aorta: Secondary | ICD-10-CM | POA: Diagnosis not present

## 2022-08-04 DIAGNOSIS — Z9181 History of falling: Secondary | ICD-10-CM | POA: Diagnosis not present

## 2022-08-05 ENCOUNTER — Encounter: Payer: Self-pay | Admitting: Gastroenterology

## 2022-08-05 ENCOUNTER — Other Ambulatory Visit: Payer: Self-pay

## 2022-08-05 DIAGNOSIS — Z09 Encounter for follow-up examination after completed treatment for conditions other than malignant neoplasm: Secondary | ICD-10-CM

## 2022-08-05 DIAGNOSIS — N289 Disorder of kidney and ureter, unspecified: Secondary | ICD-10-CM

## 2022-08-07 ENCOUNTER — Telehealth: Payer: Self-pay | Admitting: Family

## 2022-08-07 NOTE — Telephone Encounter (Signed)
Patient said he was told to not drive while taking amoxicillin 875mg /125mg  dosage and wants to know if he can drive now that he is not taking it. This medication was not prescribed by anyone here but per patient when he called the office that did, they told him to call his PCP. Please call back and advise.

## 2022-08-07 NOTE — Telephone Encounter (Signed)
Pt made aware that per [facility] last OV note he should hold off on driving for now. Pt understood but states that he has to depend on friends when he needs to go somewhere because his daughter does not live near him.  Pt states that he will probably call on Monday to speak to [place].

## 2022-08-11 DIAGNOSIS — Z9181 History of falling: Secondary | ICD-10-CM | POA: Diagnosis not present

## 2022-08-11 DIAGNOSIS — I700 Atherosclerosis of aorta: Secondary | ICD-10-CM | POA: Diagnosis not present

## 2022-08-11 DIAGNOSIS — K828 Other specified diseases of gallbladder: Secondary | ICD-10-CM | POA: Diagnosis not present

## 2022-08-11 DIAGNOSIS — I493 Ventricular premature depolarization: Secondary | ICD-10-CM | POA: Diagnosis not present

## 2022-08-11 DIAGNOSIS — I129 Hypertensive chronic kidney disease with stage 1 through stage 4 chronic kidney disease, or unspecified chronic kidney disease: Secondary | ICD-10-CM | POA: Diagnosis not present

## 2022-08-11 DIAGNOSIS — I447 Left bundle-branch block, unspecified: Secondary | ICD-10-CM | POA: Diagnosis not present

## 2022-08-11 DIAGNOSIS — K7689 Other specified diseases of liver: Secondary | ICD-10-CM | POA: Diagnosis not present

## 2022-08-11 DIAGNOSIS — Z556 Problems related to health literacy: Secondary | ICD-10-CM | POA: Diagnosis not present

## 2022-08-11 DIAGNOSIS — R7881 Bacteremia: Secondary | ICD-10-CM | POA: Diagnosis not present

## 2022-08-11 DIAGNOSIS — Z8546 Personal history of malignant neoplasm of prostate: Secondary | ICD-10-CM | POA: Diagnosis not present

## 2022-08-11 DIAGNOSIS — D631 Anemia in chronic kidney disease: Secondary | ICD-10-CM | POA: Diagnosis not present

## 2022-08-11 DIAGNOSIS — E559 Vitamin D deficiency, unspecified: Secondary | ICD-10-CM | POA: Diagnosis not present

## 2022-08-11 DIAGNOSIS — E782 Mixed hyperlipidemia: Secondary | ICD-10-CM | POA: Diagnosis not present

## 2022-08-11 DIAGNOSIS — N1831 Chronic kidney disease, stage 3a: Secondary | ICD-10-CM | POA: Diagnosis not present

## 2022-08-11 DIAGNOSIS — K8020 Calculus of gallbladder without cholecystitis without obstruction: Secondary | ICD-10-CM | POA: Diagnosis not present

## 2022-08-11 DIAGNOSIS — N2889 Other specified disorders of kidney and ureter: Secondary | ICD-10-CM | POA: Diagnosis not present

## 2022-08-11 DIAGNOSIS — M5137 Other intervertebral disc degeneration, lumbosacral region: Secondary | ICD-10-CM | POA: Diagnosis not present

## 2022-08-11 DIAGNOSIS — Z7982 Long term (current) use of aspirin: Secondary | ICD-10-CM | POA: Diagnosis not present

## 2022-08-11 DIAGNOSIS — K8030 Calculus of bile duct with cholangitis, unspecified, without obstruction: Secondary | ICD-10-CM | POA: Diagnosis not present

## 2022-08-11 DIAGNOSIS — R32 Unspecified urinary incontinence: Secondary | ICD-10-CM | POA: Diagnosis not present

## 2022-08-11 DIAGNOSIS — B961 Klebsiella pneumoniae [K. pneumoniae] as the cause of diseases classified elsewhere: Secondary | ICD-10-CM | POA: Diagnosis not present

## 2022-08-15 ENCOUNTER — Other Ambulatory Visit: Payer: Self-pay | Admitting: Family

## 2022-08-15 DIAGNOSIS — E7800 Pure hypercholesterolemia, unspecified: Secondary | ICD-10-CM

## 2022-08-15 DIAGNOSIS — I700 Atherosclerosis of aorta: Secondary | ICD-10-CM

## 2022-08-26 ENCOUNTER — Encounter: Payer: Self-pay | Admitting: Family

## 2022-08-26 ENCOUNTER — Ambulatory Visit (INDEPENDENT_AMBULATORY_CARE_PROVIDER_SITE_OTHER): Payer: Medicare HMO | Admitting: Family

## 2022-08-26 VITALS — BP 164/67 | HR 71 | Temp 97.7°F | Ht 70.0 in | Wt 129.0 lb

## 2022-08-26 DIAGNOSIS — N289 Disorder of kidney and ureter, unspecified: Secondary | ICD-10-CM | POA: Diagnosis not present

## 2022-08-26 DIAGNOSIS — L989 Disorder of the skin and subcutaneous tissue, unspecified: Secondary | ICD-10-CM | POA: Diagnosis not present

## 2022-08-26 DIAGNOSIS — N1831 Chronic kidney disease, stage 3a: Secondary | ICD-10-CM

## 2022-08-26 DIAGNOSIS — D649 Anemia, unspecified: Secondary | ICD-10-CM

## 2022-08-26 DIAGNOSIS — H6123 Impacted cerumen, bilateral: Secondary | ICD-10-CM | POA: Diagnosis not present

## 2022-08-26 DIAGNOSIS — I10 Essential (primary) hypertension: Secondary | ICD-10-CM

## 2022-08-26 NOTE — Progress Notes (Signed)
Subjective:    [name], male    DOB: [birth year], 89 y.o.   MRN: [id number] Chief Complaint  Patient presents with   Memory Loss    Discuss driving    spot on nose   PT presents to the office today for chronic follow up.   He has an appointment to establish care with Urologists for renal lesion 09/18/22 and referral GI for cholangitis.   He reports he cut his nose on a bush three months ago. It is not healing and continues to bleed.   His last visit he had a hgb of 10.5. He was suppose to complete FOBT, but has not done this yet.  Hypertension This is a chronic problem. The current episode started more than 1 year ago. The problem is unchanged. Pertinent negatives include no malaise/fatigue, peripheral edema or shortness of breath. Risk factors for coronary artery disease include male gender and dyslipidemia. The current treatment provides moderate improvement.      Review of Systems  Constitutional:  Negative for malaise/fatigue.  Respiratory:  Negative for shortness of breath.   All other systems reviewed and are negative.       Objective:   Physical Exam Vitals reviewed.  Constitutional:      General: He is not in acute distress.    Appearance: He is well-developed.  HENT:     Head: Normocephalic.     Right Ear: Tympanic membrane normal.     Left Ear: Tympanic membrane normal.  Eyes:     General:        Right eye: No discharge.        Left eye: No discharge.     Pupils: Pupils are equal, round, and reactive to light.  Neck:     Thyroid: No thyromegaly.  Cardiovascular:     Rate and Rhythm: Normal rate and regular rhythm.     Heart sounds: Normal heart sounds. No murmur heard. Pulmonary:     Effort: Pulmonary effort is normal. No respiratory distress.     Breath sounds: Normal breath sounds. No wheezing.  Abdominal:     General: Bowel sounds are normal. There is no distension.     Palpations: Abdomen is soft.     Tenderness: There is  no abdominal tenderness.  Musculoskeletal:        General: No tenderness. Normal range of motion.     Cervical back: Normal range of motion and neck supple.  Skin:    General: Skin is warm and dry.     Findings: Lesion present. No erythema or rash.     Comments: Bleeding lesion on nose  Neurological:     Mental Status: He is alert and oriented to person, place, and time.     Cranial Nerves: No cranial nerve deficit.     Deep Tendon Reflexes: Reflexes are normal and symmetric.  Psychiatric:        Behavior: Behavior normal.        Thought Content: Thought content normal.        Judgment: Judgment normal.   Bilateral ears washed with warm water and peroxide. TM WNL, pt tolerated well.   BP (!) 174/76   Pulse 71   Temp 97.7 F (36.5 C)   Ht 5\' 10"  (1.778 m)   Wt 129 lb (58.5 kg)   BMI 18.51 kg/m      Assessment & Plan:   Benjamin Buchanan comes in today with chief complaint of Memory Loss (  Discuss driving ) and spot on nose   Diagnosis and orders addressed:  1. Essential hypertension Elevated, reports BP 137/80  2. Stage 3a chronic kidney disease (HCC) Avoid NSAID's   3. Renal lesion  4. Bilateral impacted cerumen Resolved   5. Lesion of skin of nose Referral to Dermatologists pending  [place] for basal cell cancer - Ambulatory referral to Dermatology  6. Low hemoglobin Labs pending  FOBT pending  - Anemia Profile B   Labs pending Health Maintenance reviewed Diet and exercise encouraged  Follow up plan: Keep chronic follow up   Benjamin Rodney, FNP

## 2022-08-26 NOTE — Patient Instructions (Signed)
Basal Cell Carcinoma Basal cell carcinoma is the most common form of skin cancer. It begins in the basal cells, which are at the bottom of the outer skin layer (epidermis). Basal cell carcinoma can often be cured. It rarely spreads to other areas of the body (metastasizes). It may come back at the same location (recur), but it can be treated again if this happens. Basal cell carcinoma occurs most often on parts of the body that are frequently exposed to the sun, such as: Parts of the head, including the scalp or face. Ears. Neck. Arms or legs. Backs of the hands. What are the causes? This condition is usually caused by exposure to ultraviolet (UV) light. UV light may come from the sun or from tanning beds. Other causes include: Exposure to arsenic, a highly poisonous metal. Exposure to high-energy X-rays (radiation). Exposure to toxic tars and oils. Certain genetic conditions, such as a condition that makes a person sensitive to sunlight. What increases the risk? You are more likely to develop this condition if: You are older than 87 years of age. You have: Fair skin (light complexion), blond or red hair, or blue, green, or gray eye color. Childhood freckling. Had repeated sunburns or sun exposure over long periods of time, especially during childhood. A weakened body defense system (immune system). Been exposed to certain chemicals, such as tar, soot, and arsenic. Chronic inflammatory conditions or infections. A family or personal history of basal cell carcinoma. You use tanning beds. What are the signs or symptoms? The main symptom of this condition is a growth or lesion on the skin. The shape and color of the growth or lesion may vary. The main types include: An open sore that may remain open for three weeks or longer. The sore may bleed or crust. This type of lesion can be an early sign of basal cell carcinoma. Basal cell carcinoma often shows up as a sore that does not heal. A  reddish area that may crust, itch, or cause discomfort. This may occur on areas that are exposed to the sun. These patches might be easier to feel than to see. A shiny or clear bump that is red, white, or pink. In people who have dark hair, the bump is often tan, black, or brown. These bumps can look like moles. A pink growth with a raised border. The growth will have a crusted and indented area in the center. Small blood vessels may appear on the surface of the growth as it gets bigger. A scar-like area that looks like shiny, stretched skin. The area may be white, yellow, or waxy. It often has irregular borders. This may be a sign of more aggressive basal cell carcinoma. How is this diagnosed? This condition may be diagnosed with: A physical exam. Removal of a tissue sample to be examined under a microscope (biopsy). How is this treated? Treatment for this condition involves removing the cancerous tissue. The method that is used for this depends on the type, size, location, and number of tumors. Possible treatments include: Surgery, such as: Mohs surgery. In this procedure, the cancerous skin cells are removed layer by layer until all of the tumor has been removed. Surgical removal (excision) of the tumor. This involves removing the entire tumor and a small amount of normal skin that surrounds it. Cryosurgery. This involves freezing the tumor with liquid nitrogen. Plastic surgery. The tumor is removed, and healthy skin from another part of the body is used to cover the wound. This  may be done for large tumors that are in areas where it is not possible to stretch the nearby skin to sew the edges of the wound together. Therapies or treatments, such as: Radiation. This may be used for tumors on the face. Photodynamic therapy. A chemical cream is applied to the skin, and light exposure is used to activate the chemical. Electrodesiccation and curettage. This involves alternately scraping and burning  the tumor while using an electric current to control bleeding. Chemical treatments, such as imiquimod cream and interferon injections. These may be used to remove superficial tumors with minimal scarring. Follow these instructions at home: Avoid direct exposure to the sun. Do self-exams as told by your health care provider. Look for new spots or changes in your skin. Keep all follow-up visits. This is important. How is this prevented?  Avoid the sun when it is the strongest. This is usually between 10 a.m. and 4 p.m. When you are out in the sun, use a sunscreen that has a sun protection factor (SPF) of at least 30. Apply sunscreen at least 30 minutes before exposure to the sun. Reapply sunscreen every 2-4 hours while you are outside. Also reapply it after swimming and after excessive sweating. Always wear hats, protective clothing, and UV-blocking sunglasses when you are outdoors. Do not use tanning beds. Contact a health care provider if: You notice any new spots or any changes in your skin. You have had a basal cell carcinoma tumor removed, and you notice a new growth in the same location. Get help right away if: You have a spot that is sore and does not heal. You have a spot that bleeds easily. Summary Basal cell carcinoma is the most common form of skin cancer. It begins in the bottom of the outer skin layer (epidermis). Basal cell carcinoma can almost always be cured. This condition is usually caused by exposure to ultraviolet (UV) light. It mostly affects the face, scalp, neck, ears, arms, legs, or backs of the hands. The main symptom of this condition is a growth or lesion on the skin that can vary in shape and color. You can prevent this cancer by avoiding direct exposure to the sun, applying sunscreen of at least 30 SPF, and wearing protective clothing. Apply sunscreen 30 minutes before you go out into the sun, and reapply every 2-4 hours while you are outside. This information is  not intended to replace advice given to you by your health care provider. Make sure you discuss any questions you have with your health care provider. Document Revised: 05/03/2020 Document Reviewed: 05/03/2020 Elsevier Patient Education  2024 [employer].

## 2022-08-29 ENCOUNTER — Other Ambulatory Visit: Payer: Medicare HMO

## 2022-08-29 DIAGNOSIS — N289 Disorder of kidney and ureter, unspecified: Secondary | ICD-10-CM | POA: Diagnosis not present

## 2022-08-29 DIAGNOSIS — Z09 Encounter for follow-up examination after completed treatment for conditions other than malignant neoplasm: Secondary | ICD-10-CM | POA: Diagnosis not present

## 2022-08-30 LAB — FECAL OCCULT BLOOD, IMMUNOCHEMICAL: Fecal Occult Bld: NEGATIVE

## 2022-09-17 NOTE — Progress Notes (Unsigned)
Referring Provider:Hawks, Edilia Bo, FNP  Primary Care Physician:  [name], FNP Primary Gastroenterologist:  Dr. Jena Gauss  Chief Complaint  Patient presents with   Follow-up    Hospital follow up. No problem     HPI:   Benjamin Buchanan is a 89 y.o. male presenting today at the request of [name], FNP for cholangitis.  Patient presented to [facility] 07/12/2022 with acute onset lower sternal/epigastric abdominal pain with associated nausea and 1 episode of dry heaving.  He was found to have elevated LFTs, CT concerning for biliary obstruction.  He was transferred from [facility] and underwent MRI/MRCP that showed possible 3 mm distal CBD stone, 8 mm choledochal cyst arising off the common bile duct, aneurysmal dilation of portal vein, 3 enhancing lesions on left kidney concerning for small renal cell carcinoma.  Ultimately, patient's abdominal pain resolved within about 24 hours.  No ERCP was pursued as LFTs and WBC count were trending down.  He was discharged on 7/5.  Last LFTs on 7/4 with AST 56, ALT 244, alk phos 130; this is down from max of ALT 883, AST 504. Notably, his blood culture grew Klebsiella pneumonia. Patient was treated with IV Rocephin during the hospital stay and discharged home on Augmentin orally.   Most recent labs 07/31/2022 with LFTs within normal limits.  Today:  Doing well. Had not had any recurrent abdominal pain. No nausea, vomiting, GERD symptoms, dysphagia, constipation, diarrhea, brbpr, or melena.  Reports he had never had any symptoms similar to what he experienced in June/July during his hospital admission.  He does not have any interest in having his gallbladder removed.   Past Medical History:  Diagnosis Date   Anemia    History of prostate cancer 1994   HLD (hyperlipidemia)    HTN (hypertension)    PVC (premature ventricular contraction)    history of    Past Surgical History:  Procedure Laterality Date   HERNIA REPAIR      INGUINAL HERNIA REPAIR Left 10/23/2016   Procedure: LEFT INGUINAL HERNIA REPAIR WITH MESH  ;  Surgeon: Emelia Loron, MD;  Location: [facility];  Service: General;  Laterality: Left;   INSERTION OF MESH Left 10/23/2016   Procedure: INSERTION OF MESH;  Surgeon: Emelia Loron, MD;  Location: [facility];  Service: General;  Laterality: Left;   LUMBAR LAMINECTOMY/DECOMPRESSION MICRODISCECTOMY N/A 02/10/2018   Procedure: L2-3, L3-4 and L4-5 complete decompressive lumbar laminectomies for spinal stenosis, Foraminotomies  for L3, L4, L5 nerve roots.;  Surgeon: Ranee Gosselin, MD;  Location: [facility];  Service: Orthopedics;  Laterality: N/A;    PROSTATE SURGERY      Current Outpatient Medications  Medication Sig Dispense Refill   acetaminophen (TYLENOL) 325 MG tablet Take 325 mg by mouth 2 (two) times daily as needed for moderate pain.     amLODipine (NORVASC) 10 MG tablet TAKE 1 TABLET BY MOUTH EVERY DAY 90 tablet 0   aspirin 81 MG tablet Take 81 mg by mouth every Monday, Wednesday, and Friday.      benazepril (LOTENSIN) 20 MG tablet TAKE 1 TABLET BY MOUTH EVERY DAY 90 tablet 0   Cholecalciferol 1000 UNITS capsule Take 1,000 Units by mouth daily.     Omega-3 Fatty Acids (FISH OIL PO) Take 1 tablet by mouth daily.     ONETOUCH ULTRA test strip CHECK BLOOD SUGAR EVERY DAY AND AS NEEDED 100 strip 11   No current facility-administered medications  for this visit.    Allergies as of 09/18/2022 - Review Complete 09/18/2022  Allergen Reaction Noted   Lipitor [atorvastatin] Other (See Comments) 09/06/2012    Family History  Problem Relation Age of Onset   Cancer Father        prostate cancer   Hypertension Other        family history    Social History   Socioeconomic History   Marital status: Widowed    Spouse name: Not on file   Number of children: 2   Years of education: 10   Highest education level: 10th grade  Occupational History    Occupation: retired  Tobacco Use   Smoking status: Never   Smokeless tobacco: Never  Vaping Use   Vaping status: Never Used  Substance and Sexual Activity   Alcohol use: No   Drug use: No   Sexual activity: Not Currently  Other Topics Concern   Not on file  Social History Narrative   Retired. Lives alone. No family nearby, but he eats out for breakfast and dinner with friends several times per week. Stays active. Still very independent 11/2020   Social Determinants of Health   Financial Resource Strain: Low Risk  (01/27/2022)   Overall Financial Resource Strain (CARDIA)    Difficulty of Paying Living Expenses: Not hard at all  Food Insecurity: No Food Insecurity (07/21/2022)   Hunger Vital Sign    Worried About Running Out of Food in the Last Year: Never true    Ran Out of Food in the Last Year: Never true  Transportation Needs: No Transportation Needs (07/21/2022)   PRAPARE - Administrator, Civil Service (Medical): No    Lack of Transportation (Non-Medical): No  Physical Activity: Inactive (01/27/2022)   Exercise Vital Sign    Days of Exercise per Week: 0 days    Minutes of Exercise per Session: 0 min  Stress: No Stress Concern Present (01/27/2022)   [employer] of Occupational Health - Occupational Stress Questionnaire    Feeling of Stress : Not at all  Social Connections: Moderately Isolated (01/27/2022)   Social Connection and Isolation Panel [NHANES]    Frequency of Communication with Friends and Family: More than three times a week    Frequency of Social Gatherings with Friends and Family: Three times a week    Attends Religious Services: Never    Active Member of Clubs or Organizations: Yes    Attends Banker Meetings: More than 4 times per year    Marital Status: Widowed  Intimate Partner Violence: Not At Risk (07/13/2022)   Humiliation, Afraid, Rape, and Kick questionnaire    Fear of Current or Ex-Partner: No    Emotionally Abused: No     Physically Abused: No    Sexually Abused: No    Review of Systems: Gen: Denies any fever, chills, cold or flulike symptoms, presyncope, syncope. CV: Denies chest pain, heart palpitations. Resp: Denies shortness of breath, cough. GI: See HPI GU : Denies urinary burning, urinary frequency, urinary hesitancy MS: Denies joint pain. Derm: Denies rash. Psych: Denies depression, anxiety. Heme: See HPI  Physical Exam: BP 136/88 (BP Location: Right Arm, Patient Position: Sitting, Cuff Size: Normal)   Pulse 69   Temp 97.8 F (36.6 C) (Temporal)   Ht 5\' 10"  (1.778 m)   Wt 129 lb (58.5 kg)   SpO2 98%   BMI 18.51 kg/m  General:   Alert and oriented. Pleasant and cooperative. Well-nourished and well-developed.  Head:  Normocephalic and atraumatic. Eyes:  Without icterus, sclera clear and conjunctiva pink.  Ears:  Normal auditory acuity. Lungs:  Clear to auscultation bilaterally. No wheezes, rales, or rhonchi. No distress.  Heart:  S1, S2 present without murmurs appreciated.  Abdomen:  +BS, soft, non-tender and non-distended. No HSM noted. No guarding or rebound. No masses appreciated.  Rectal:  Deferred  Msk:  Symmetrical without gross deformities. Normal posture. Extremities:  Without edema. Neurologic:  Alert and  oriented x4;  grossly normal neurologically. Skin:  Intact without significant lesions or rashes. Psych:  Normal mood and affect.    Assessment:  89 y.o. male with history of prostate cancer, HTN, HLD, PVCs, recently found to have left renal lesions concerning for renal cell carcinoma now following with urology with plans for surveillance in 3 months, presenting today following recent admission in June/July at [facility] for transient biliary obstruction with cholangitis.  No ERCP was required as patient ended up passing stone on his own and LFTs and WBC count were improving.  Most recent LFTs on 07/31/2022 normalized.  He has not had any recurrent abdominal pain, nausea, or  vomiting, and reports no similar symptoms prior to this acute episode.  We discussed the possibility of cholecystectomy to prevent recurrence, the patient states he has no interest in surgery.  He prefers to monitor for now.  If recurrent symptoms, may need to reconsider.  Recommend low-fat diet.   Plan:  Monitor for recurrent abdominal pain. Low-fat diet. Follow-up as needed.   Ermalinda Memos, PA-C [facility] 09/18/2022

## 2022-09-18 ENCOUNTER — Encounter: Payer: Self-pay | Admitting: Gastroenterology

## 2022-09-18 ENCOUNTER — Encounter: Payer: Self-pay | Admitting: Urology

## 2022-09-18 ENCOUNTER — Other Ambulatory Visit: Payer: Self-pay | Admitting: Family

## 2022-09-18 ENCOUNTER — Ambulatory Visit: Payer: Medicare HMO | Admitting: Urology

## 2022-09-18 ENCOUNTER — Ambulatory Visit: Payer: Medicare HMO | Admitting: Gastroenterology

## 2022-09-18 VITALS — BP 136/88 | HR 69 | Temp 97.8°F | Ht 70.0 in | Wt 129.0 lb

## 2022-09-18 VITALS — BP 156/82 | HR 86 | Ht 70.0 in | Wt 129.0 lb

## 2022-09-18 DIAGNOSIS — N2889 Other specified disorders of kidney and ureter: Secondary | ICD-10-CM | POA: Diagnosis not present

## 2022-09-18 DIAGNOSIS — I10 Essential (primary) hypertension: Secondary | ICD-10-CM

## 2022-09-18 DIAGNOSIS — Z8719 Personal history of other diseases of the digestive system: Secondary | ICD-10-CM

## 2022-09-18 DIAGNOSIS — N281 Cyst of kidney, acquired: Secondary | ICD-10-CM

## 2022-09-18 DIAGNOSIS — K8033 Calculus of bile duct with acute cholangitis with obstruction: Secondary | ICD-10-CM

## 2022-09-18 DIAGNOSIS — N289 Disorder of kidney and ureter, unspecified: Secondary | ICD-10-CM

## 2022-09-18 LAB — URINALYSIS, ROUTINE W REFLEX MICROSCOPIC
Bilirubin, UA: NEGATIVE
Glucose, UA: NEGATIVE
Ketones, UA: NEGATIVE
Leukocytes,UA: NEGATIVE
Nitrite, UA: NEGATIVE
RBC, UA: NEGATIVE
Specific Gravity, UA: 1.02 (ref 1.005–1.030)
Urobilinogen, Ur: 0.2 mg/dL (ref 0.2–1.0)
pH, UA: 5.5 (ref 5.0–7.5)

## 2022-09-18 LAB — MICROSCOPIC EXAMINATION: Bacteria, UA: NONE SEEN

## 2022-09-18 NOTE — Patient Instructions (Signed)
We will hold off on referral to general surgery to discuss having your gallbladder removed for now.  If you have recurrent problems with your gallbladder, we may need to discuss this again.  I recommend that you follow a low-fat diet. Meats should be poultry or fish and baked, boiled, broiled, or grilled. Avoid fried, fatty, greasy foods.  It was very nice to meet you today!  I will follow-up with you as needed.  Ermalinda Memos, PA-C [facility]

## 2022-09-18 NOTE — Progress Notes (Signed)
Subjective: 1. Renal lesion   2. Renal cyst   3. Other specified disorders of kidney and ureter      Consult requested by Benjamin Rodney NP  Benjamin Buchanan is a 89 yo male who had a CT in June for abdominal pain and was noted to have multiple renal cysts, some with complexity.  He an MRI on 6/30 and that showed 3 enhancing lesion of the lower pole that were concerning for possible small renal cell ca's with the largest of 1.5cm.  He has had no hematuria.  He had prostate cancer several years ago that was treated with surgery.  He has no incontinence.   He has not had PSA's since 2014.  His IPSS is 1 with nocturia 1-2x. His UA is unremarkable.  His Cr was 1.27 with CKD3a on 07/31/22.   ROS:  Review of Systems  Musculoskeletal:  Positive for back pain and joint pain.  All other systems reviewed and are negative.   Allergies  Allergen Reactions   Lipitor [Atorvastatin] Other (See Comments)    myalgia    Past Medical History:  Diagnosis Date   Anemia    History of prostate cancer 1994   HLD (hyperlipidemia)    HTN (hypertension)    PVC (premature ventricular contraction)    history of    Past Surgical History:  Procedure Laterality Date   HERNIA REPAIR     INGUINAL HERNIA REPAIR Left 10/23/2016   Procedure: LEFT INGUINAL HERNIA REPAIR WITH MESH  ;  Surgeon: Emelia Loron, MD;  Location: [facility];  Service: General;  Laterality: Left;   INSERTION OF MESH Left 10/23/2016   Procedure: INSERTION OF MESH;  Surgeon: Emelia Loron, MD;  Location: [facility];  Service: General;  Laterality: Left;   LUMBAR LAMINECTOMY/DECOMPRESSION MICRODISCECTOMY N/A 02/10/2018   Procedure: L2-3, L3-4 and L4-5 complete decompressive lumbar laminectomies for spinal stenosis, Foraminotomies  for L3, L4, L5 nerve roots.;  Surgeon: Ranee Gosselin, MD;  Location: [facility];  Service: Orthopedics;  Laterality: N/A;    PROSTATE SURGERY      Social History   Socioeconomic  History   Marital status: Widowed    Spouse name: Not on file   Number of children: 2   Years of education: 10   Highest education level: 10th grade  Occupational History   Occupation: retired  Tobacco Use   Smoking status: Never   Smokeless tobacco: Never  Vaping Use   Vaping status: Never Used  Substance and Sexual Activity   Alcohol use: No   Drug use: No   Sexual activity: Not Currently  Other Topics Concern   Not on file  Social History Narrative   Retired. Lives alone. No family nearby, but he eats out for breakfast and dinner with friends several times per week. Stays active. Still very independent 11/2020   Social Determinants of Health   Financial Resource Strain: Low Risk  (01/27/2022)   Overall Financial Resource Strain (CARDIA)    Difficulty of Paying Living Expenses: Not hard at all  Food Insecurity: No Food Insecurity (07/21/2022)   Hunger Vital Sign    Worried About Running Out of Food in the Last Year: Never true    Ran Out of Food in the Last Year: Never true  Transportation Needs: No Transportation Needs (07/21/2022)   PRAPARE - Administrator, Civil Service (Medical): No    Lack of Transportation (Non-Medical): No  Physical Activity: Inactive (01/27/2022)   Exercise  Vital Sign    Days of Exercise per Week: 0 days    Minutes of Exercise per Session: 0 min  Stress: No Stress Concern Present (01/27/2022)   [employer] of Occupational Health - Occupational Stress Questionnaire    Feeling of Stress : Not at all  Social Connections: Moderately Isolated (01/27/2022)   Social Connection and Isolation Panel [NHANES]    Frequency of Communication with Friends and Family: More than three times a week    Frequency of Social Gatherings with Friends and Family: Three times a week    Attends Religious Services: Never    Active Member of Clubs or Organizations: Yes    Attends Banker Meetings: More than 4 times per year    Marital Status:  Widowed  Intimate Partner Violence: Not At Risk (07/13/2022)   Humiliation, Afraid, Rape, and Kick questionnaire    Fear of Current or Ex-Partner: No    Emotionally Abused: No    Physically Abused: No    Sexually Abused: No    Family History  Problem Relation Age of Onset   Cancer Father        prostate cancer   Hypertension Other        family history    Anti-infectives: Anti-infectives (From admission, onward)    None       Current Outpatient Medications  Medication Sig Dispense Refill   acetaminophen (TYLENOL) 325 MG tablet Take 325 mg by mouth 2 (two) times daily as needed for moderate pain.     amLODipine (NORVASC) 10 MG tablet TAKE 1 TABLET BY MOUTH EVERY DAY 90 tablet 0   aspirin 81 MG tablet Take 81 mg by mouth every Monday, Wednesday, and Friday.      benazepril (LOTENSIN) 20 MG tablet TAKE 1 TABLET BY MOUTH EVERY DAY 90 tablet 0   Cholecalciferol 1000 UNITS capsule Take 1,000 Units by mouth daily.     Omega-3 Fatty Acids (FISH OIL PO) Take 1 tablet by mouth daily.     ONETOUCH ULTRA test strip CHECK BLOOD SUGAR EVERY DAY AND AS NEEDED 100 strip 11   No current facility-administered medications for this visit.     Objective: Vital signs in last 24 hours: BP (!) 156/82   Pulse 86   Ht 5\' 10"  (1.778 m)   Wt 129 lb (58.5 kg)   BMI 18.51 kg/m   Intake/Output from previous day: No intake/output data recorded. Intake/Output this shift: @IOTHISSHIFT @   Physical Exam Vitals reviewed.  Constitutional:      Appearance: Normal appearance.  Cardiovascular:     Rate and Rhythm: Normal rate and regular rhythm.     Heart sounds: Normal heart sounds.  Pulmonary:     Effort: Pulmonary effort is normal. No respiratory distress.     Breath sounds: Normal breath sounds.  Abdominal:     General: Abdomen is flat.     Palpations: Abdomen is soft. There is no mass.     Tenderness: There is no abdominal tenderness.  Lymphadenopathy:     Cervical:     Right  cervical: No superficial cervical adenopathy.    Left cervical: No superficial cervical adenopathy.     Upper Body:     Right upper body: No supraclavicular or axillary adenopathy.     Left upper body: No supraclavicular or axillary adenopathy.  Neurological:     Mental Status: He is alert.     Lab Results:  Results for orders placed or performed in visit on  09/18/22 (from the past 24 hour(s))  Urinalysis, Routine w reflex microscopic     Status: Abnormal   Collection Time: 09/18/22  1:00 PM  Result Value Ref Range   Specific Gravity, UA 1.020 1.005 - 1.030   pH, UA 5.5 5.0 - 7.5   Color, UA Yellow Yellow   Appearance Ur Clear Clear   Leukocytes,UA Negative Negative   Protein,UA 1+ (A) Negative/Trace   Glucose, UA Negative Negative   Ketones, UA Negative Negative   RBC, UA Negative Negative   Bilirubin, UA Negative Negative   Urobilinogen, Ur 0.2 0.2 - 1.0 mg/dL   Nitrite, UA Negative Negative   Microscopic Examination See below:    Narrative   Performed at:  [address] - [facility] [address] [place], [place]  [id number] Lab Director: Chinita Pester MT, Phone:  [phone]  Microscopic Examination     Status: None   Collection Time: 09/18/22  1:00 PM   Urine  Result Value Ref Range   WBC, UA 0-5 0 - 5 /hpf   RBC, Urine 0-2 0 - 2 /hpf   Epithelial Cells (non renal) 0-10 0 - 10 /hpf   Bacteria, UA None seen None seen/Few   Narrative   Performed at:  [address] - [facility] [address], [place], [place]  [id number] Lab Director: Chinita Pester MT, Phone:  [phone]    BMET No results for input(s): "NA", "K", "CL", "CO2", "GLUCOSE", "BUN", "CREATININE", "CALCIUM" in the last 72 hours. PT/INR No results for input(s): "LABPROT", "INR" in the last 72 hours. ABG No results for input(s): "PHART", "HCO3" in the last 72 hours.  Invalid input(s): "PCO2", "PO2" Recent Results (from the past 2160 hour(s))  CBC with Differential     Status: Abnormal   Collection  Time: 07/12/22 10:26 PM  Result Value Ref Range   WBC 7.4 4.0 - 10.5 K/uL   RBC 3.59 (L) 4.22 - 5.81 MIL/uL   Hemoglobin 11.3 (L) 13.0 - 17.0 g/dL   HCT 95.2 (L) 84.1 - 32.4 %   MCV 97.5 80.0 - 100.0 fL   MCH 31.5 26.0 - 34.0 pg   MCHC 32.3 30.0 - 36.0 g/dL   RDW 40.1 02.7 - 25.3 %   Platelets 246 150 - 400 K/uL   nRBC 0.0 0.0 - 0.2 %   Neutrophils Relative % 96 %   Neutro Abs 7.0 1.7 - 7.7 K/uL   Lymphocytes Relative 3 %   Lymphs Abs 0.2 (L) 0.7 - 4.0 K/uL   Monocytes Relative 0 %   Monocytes Absolute 0.0 (L) 0.1 - 1.0 K/uL   Eosinophils Relative 1 %   Eosinophils Absolute 0.0 0.0 - 0.5 K/uL   Basophils Relative 0 %   Basophils Absolute 0.0 0.0 - 0.1 K/uL   Immature Granulocytes 0 %   Abs Immature Granulocytes 0.02 0.00 - 0.07 K/uL    Comment: Performed at [facility], [address], [place], [place] [address]  Comprehensive metabolic panel     Status: Abnormal   Collection Time: 07/12/22 10:26 PM  Result Value Ref Range   Sodium 140 135 - 145 mmol/L   Potassium 4.1 3.5 - 5.1 mmol/L   Chloride 110 98 - 111 mmol/L   CO2 22 22 - 32 mmol/L   Glucose, Bld 145 (H) 70 - 99 mg/dL    Comment: Glucose reference range applies only to samples taken after fasting for at least 8 hours.   BUN 28 (H) 8 - 23 mg/dL  Creatinine, Ser 1.27 (H) 0.61 - 1.24 mg/dL   Calcium 8.9 8.9 - 64.3 mg/dL   Total Protein 6.7 6.5 - 8.1 g/dL   Albumin 3.6 3.5 - 5.0 g/dL   AST 329 (H) 15 - 41 U/L   ALT 217 (H) 0 - 44 U/L   Alkaline Phosphatase 98 38 - 126 U/L   Total Bilirubin 1.1 0.3 - 1.2 mg/dL   GFR, Estimated 53 (L) >60 mL/min    Comment: (NOTE) Calculated using the CKD-EPI Creatinine Equation (2021)    Anion gap 8 5 - 15    Comment: Performed at [facility], [address], [place], [place] [address]  Lipase, blood     Status: Abnormal   Collection Time: 07/12/22 10:26 PM  Result Value Ref Range   Lipase 65 (H) 11 - 51 U/L    Comment: Performed at [facility], [address],  [place], [place] [address]  Urinalysis, Routine w reflex microscopic -Urine, Clean Catch     Status: Abnormal   Collection Time: 07/12/22 11:58 PM  Result Value Ref Range   Color, Urine YELLOW YELLOW   APPearance CLEAR CLEAR   Specific Gravity, Urine 1.020 1.005 - 1.030   pH 5.0 5.0 - 8.0   Glucose, UA NEGATIVE NEGATIVE mg/dL   Hgb urine dipstick NEGATIVE NEGATIVE   Bilirubin Urine NEGATIVE NEGATIVE   Ketones, ur NEGATIVE NEGATIVE mg/dL   Protein, ur 30 (A) NEGATIVE mg/dL   Nitrite NEGATIVE NEGATIVE   Leukocytes,Ua NEGATIVE NEGATIVE   RBC / HPF 0-5 0 - 5 RBC/hpf   WBC, UA 0-5 0 - 5 WBC/hpf   Bacteria, UA NONE SEEN NONE SEEN   Squamous Epithelial / HPF 0-5 0 - 5 /HPF   Mucus PRESENT     Comment: Performed at [facility], [address], [place], [place] [address]  Gamma GT     Status: Abnormal   Collection Time: 07/13/22  7:41 PM  Result Value Ref Range   GGT 228 (H) 7 - 50 U/L    Comment: Performed at [facility], [address] [place], [place] [address]  Comprehensive metabolic panel     Status: Abnormal   Collection Time: 07/14/22  4:38 AM  Result Value Ref Range   Sodium 142 135 - 145 mmol/L   Potassium 3.8 3.5 - 5.1 mmol/L   Chloride 111 98 - 111 mmol/L   CO2 22 22 - 32 mmol/L   Glucose, Bld 98 70 - 99 mg/dL    Comment: Glucose reference range applies only to samples taken after fasting for at least 8 hours.   BUN 22 8 - 23 mg/dL   Creatinine, Ser 3.55 (H) 0.61 - 1.24 mg/dL   Calcium 8.5 (L) 8.9 - 10.3 mg/dL   Total Protein 5.6 (L) 6.5 - 8.1 g/dL   Albumin 2.8 (L) 3.5 - 5.0 g/dL   AST 732 (H) 15 - 41 U/L   ALT 883 (H) 0 - 44 U/L   Alkaline Phosphatase 120 38 - 126 U/L   Total Bilirubin 1.1 0.3 - 1.2 mg/dL   GFR, Estimated 54 (L) >60 mL/min    Comment: (NOTE) Calculated using the CKD-EPI Creatinine Equation (2021)    Anion gap 9 5 - 15    Comment: Performed at [facility], [address], [place], [place] [address]  CBC with Differential/Platelet      Status: Abnormal   Collection Time: 07/14/22  4:38 AM  Result Value Ref Range  WBC 18.2 (H) 4.0 - 10.5 K/uL   RBC 3.43 (L) 4.22 - 5.81 MIL/uL   Hemoglobin 10.8 (L) 13.0 - 17.0 g/dL   HCT 62.1 (L) 30.8 - 65.7 %   MCV 95.6 80.0 - 100.0 fL   MCH 31.5 26.0 - 34.0 pg   MCHC 32.9 30.0 - 36.0 g/dL   RDW 84.6 96.2 - 95.2 %   Platelets 175 150 - 400 K/uL   nRBC 0.0 0.0 - 0.2 %   Neutrophils Relative % 92 %   Neutro Abs 16.7 (H) 1.7 - 7.7 K/uL   Lymphocytes Relative 3 %   Lymphs Abs 0.5 (L) 0.7 - 4.0 K/uL   Monocytes Relative 4 %   Monocytes Absolute 0.8 0.1 - 1.0 K/uL   Eosinophils Relative 0 %   Eosinophils Absolute 0.0 0.0 - 0.5 K/uL   Basophils Relative 0 %   Basophils Absolute 0.0 0.0 - 0.1 K/uL   Immature Granulocytes 1 %   Abs Immature Granulocytes 0.19 (H) 0.00 - 0.07 K/uL    Comment: Performed at [facility], [address], [place], [place] [address]  Magnesium     Status: None   Collection Time: 07/14/22  4:38 AM  Result Value Ref Range   Magnesium 1.8 1.7 - 2.4 mg/dL    Comment: Performed at [facility], [address] [place], [place] [address]  Culture, blood (Routine X 2) w Reflex to ID Panel     Status: Abnormal   Collection Time: 07/14/22 10:39 AM   Specimen: BLOOD LEFT ARM  Result Value Ref Range   Specimen Description BLOOD LEFT ARM    Special Requests      BOTTLES DRAWN AEROBIC ONLY Blood Culture results may not be optimal due to an inadequate volume of blood received in culture bottles   Culture  Setup Time      GRAM NEGATIVE RODS AEROBIC BOTTLE ONLY CRITICAL RESULT CALLED TO, READ BACK BY AND VERIFIED WITH: PHARMD HAILEY VON DOLEN ON 07/16/22 @ 1955 BY DRT CRITICAL VALUE NOTED.  VALUE IS CONSISTENT WITH PREVIOUSLY REPORTED AND CALLED VALUE.    Culture (A)     KLEBSIELLA PNEUMONIAE SUSCEPTIBILITIES PERFORMED ON PREVIOUS CULTURE WITHIN THE LAST 5 DAYS. Performed at [facility], [address] [place], [place] [address]    Report Status  07/17/2022 FINAL   Procalcitonin     Status: None   Collection Time: 07/14/22 10:39 AM  Result Value Ref Range   Procalcitonin 28.07 ng/mL    Comment:        Interpretation: PCT >= 10 ng/mL: Important systemic inflammatory response, almost exclusively due to severe bacterial sepsis or septic shock. (NOTE)       Sepsis PCT Algorithm           Lower Respiratory Tract                                      Infection PCT Algorithm    ----------------------------     ----------------------------         PCT < 0.25 ng/mL                PCT < 0.10 ng/mL          Strongly encourage             Strongly discourage   discontinuation of antibiotics    initiation of  antibiotics    ----------------------------     -----------------------------       PCT 0.25 - 0.50 ng/mL            PCT 0.10 - 0.25 ng/mL               OR       >80% decrease in PCT            Discourage initiation of                                            antibiotics      Encourage discontinuation           of antibiotics    ----------------------------     -----------------------------         PCT >= 0.50 ng/mL              PCT 0.26 - 0.50 ng/mL                AND       <80% decrease in PCT             Encourage initiation of                                             antibiotics       Encourage continuation           of antibiotics    ----------------------------     -----------------------------        PCT >= 0.50 ng/mL                  PCT > 0.50 ng/mL               AND         increase in PCT                  Strongly encourage                                      initiation of antibiotics    Strongly encourage escalation           of antibiotics                                     -----------------------------                                           PCT <= 0.25 ng/mL                                                 OR                                        >  80% decrease in PCT                                       Discontinue / Do not initiate                                             antibiotics  Performed at [facility], [address] [place], [place] [address]   Culture, blood (Routine X 2) w Reflex to ID Panel     Status: Abnormal   Collection Time: 07/14/22 11:09 AM   Specimen: BLOOD LEFT ARM  Result Value Ref Range   Specimen Description BLOOD LEFT ARM    Special Requests      BOTTLES DRAWN AEROBIC AND ANAEROBIC Blood Culture adequate volume   Culture  Setup Time      GRAM NEGATIVE RODS IN BOTH AEROBIC AND ANAEROBIC BOTTLES CRITICAL RESULT CALLED TO, READ BACK BY AND VERIFIED WITH: PHARMD J. LEDFORD 07/15/22 @ 0210 BY AB Performed at [facility], [address] [place], [place] [address]    Culture KLEBSIELLA PNEUMONIAE (A)    Report Status 07/17/2022 FINAL    Organism ID, Bacteria KLEBSIELLA PNEUMONIAE       Susceptibility   Klebsiella pneumoniae - MIC*    AMPICILLIN >=32 RESISTANT Resistant     CEFEPIME <=0.12 SENSITIVE Sensitive     CEFTAZIDIME <=1 SENSITIVE Sensitive     CEFTRIAXONE <=0.25 SENSITIVE Sensitive     CIPROFLOXACIN <=0.25 SENSITIVE Sensitive     GENTAMICIN <=1 SENSITIVE Sensitive     IMIPENEM <=0.25 SENSITIVE Sensitive     TRIMETH/SULFA <=20 SENSITIVE Sensitive     AMPICILLIN/SULBACTAM 4 SENSITIVE Sensitive     PIP/TAZO <=4 SENSITIVE Sensitive     * KLEBSIELLA PNEUMONIAE  Blood Culture ID Panel (Reflexed)     Status: Abnormal   Collection Time: 07/14/22 11:09 AM  Result Value Ref Range   Enterococcus faecalis NOT DETECTED NOT DETECTED   Enterococcus Faecium NOT DETECTED NOT DETECTED   Listeria monocytogenes NOT DETECTED NOT DETECTED   Staphylococcus species NOT DETECTED NOT DETECTED   Staphylococcus aureus (BCID) NOT DETECTED NOT DETECTED   Staphylococcus epidermidis NOT DETECTED NOT DETECTED   Staphylococcus lugdunensis NOT DETECTED NOT DETECTED   Streptococcus species NOT DETECTED NOT DETECTED   Streptococcus agalactiae NOT DETECTED  NOT DETECTED   Streptococcus pneumoniae NOT DETECTED NOT DETECTED   Streptococcus pyogenes NOT DETECTED NOT DETECTED   A.calcoaceticus-baumannii NOT DETECTED NOT DETECTED   Bacteroides fragilis NOT DETECTED NOT DETECTED   Enterobacterales DETECTED (A) NOT DETECTED    Comment: Enterobacterales represent a large order of gram negative bacteria, not a single organism. CRITICAL RESULT CALLED TO, READ BACK BY AND VERIFIED WITH: PHARMD J. LEDFORD 07/15/22 @ 0210 BY AB    Enterobacter cloacae complex NOT DETECTED NOT DETECTED   Escherichia coli NOT DETECTED NOT DETECTED   Klebsiella aerogenes NOT DETECTED NOT DETECTED   Klebsiella oxytoca NOT DETECTED NOT DETECTED   Klebsiella pneumoniae DETECTED (A) NOT DETECTED    Comment: CRITICAL RESULT CALLED TO, READ BACK BY AND VERIFIED WITH: PHARMD J. LEDFORD 07/15/22 @ 0210 BY AB    Proteus species NOT DETECTED NOT DETECTED   Salmonella species NOT DETECTED NOT DETECTED   Serratia marcescens NOT DETECTED NOT DETECTED  Haemophilus influenzae NOT DETECTED NOT DETECTED   Neisseria meningitidis NOT DETECTED NOT DETECTED   Pseudomonas aeruginosa NOT DETECTED NOT DETECTED   Stenotrophomonas maltophilia NOT DETECTED NOT DETECTED   Candida albicans NOT DETECTED NOT DETECTED   Candida auris NOT DETECTED NOT DETECTED   Candida glabrata NOT DETECTED NOT DETECTED   Candida krusei NOT DETECTED NOT DETECTED   Candida parapsilosis NOT DETECTED NOT DETECTED   Candida tropicalis NOT DETECTED NOT DETECTED   Cryptococcus neoformans/gattii NOT DETECTED NOT DETECTED   CTX-M ESBL NOT DETECTED NOT DETECTED   Carbapenem resistance IMP NOT DETECTED NOT DETECTED   Carbapenem resistance KPC NOT DETECTED NOT DETECTED   Carbapenem resistance NDM NOT DETECTED NOT DETECTED   Carbapenem resist OXA 48 LIKE NOT DETECTED NOT DETECTED   Carbapenem resistance VIM NOT DETECTED NOT DETECTED    Comment: Performed at [facility], [address] [place] [address], [place], [place] [address]   Urinalysis, Routine w reflex microscopic -Urine, Clean Catch     Status: Abnormal   Collection Time: 07/14/22  1:57 PM  Result Value Ref Range   Color, Urine YELLOW YELLOW   APPearance CLEAR CLEAR   Specific Gravity, Urine 1.017 1.005 - 1.030   pH 5.0 5.0 - 8.0   Glucose, UA NEGATIVE NEGATIVE mg/dL   Hgb urine dipstick NEGATIVE NEGATIVE   Bilirubin Urine NEGATIVE NEGATIVE   Ketones, ur 5 (A) NEGATIVE mg/dL   Protein, ur 604 (A) NEGATIVE mg/dL   Nitrite NEGATIVE NEGATIVE   Leukocytes,Ua NEGATIVE NEGATIVE   RBC / HPF 0-5 0 - 5 RBC/hpf   WBC, UA 0-5 0 - 5 WBC/hpf   Bacteria, UA NONE SEEN NONE SEEN   Squamous Epithelial / HPF 0-5 0 - 5 /HPF   Mucus PRESENT     Comment: Performed at [facility], [address] [place], [place] [address]  Expectorated Sputum Assessment w Gram Stain, Rflx to Resp Cult     Status: None   Collection Time: 07/14/22  5:12 PM   Specimen: Sputum  Result Value Ref Range   Specimen Description SPU    Special Requests NONE    Sputum evaluation      Sputum specimen not acceptable for testing.  Please recollect.   Gram Stain Report Called to,Read Back By and Verified With: RN Rudene Anda [phone] @ 2150 FH Performed at [facility], [address] [place], [place] [address]    Report Status 07/14/2022 FINAL   Phosphorus     Status: None   Collection Time: 07/14/22  6:47 PM  Result Value Ref Range   Phosphorus 2.5 2.5 - 4.6 mg/dL    Comment: Performed at [facility], [address], [place], [place] [address]  Hepatic function panel     Status: Abnormal   Collection Time: 07/15/22 12:38 AM  Result Value Ref Range   Total Protein 5.7 (L) 6.5 - 8.1 g/dL   Albumin 2.7 (L) 3.5 - 5.0 g/dL   AST 657 (H) 15 - 41 U/L   ALT 603 (H) 0 - 44 U/L   Alkaline Phosphatase 128 (H) 38 - 126 U/L   Total Bilirubin 1.1 0.3 - 1.2 mg/dL   Bilirubin, Direct 0.3 (H) 0.0 - 0.2 mg/dL   Indirect Bilirubin 0.8 0.3 - 0.9 mg/dL    Comment: Performed at [facility], [address], [place], [place] [address]  CBC with Differential/Platelet     Status: Abnormal   Collection Time: 07/15/22  12:38 AM  Result Value Ref Range   WBC 11.3 (H) 4.0 - 10.5 K/uL   RBC 3.51 (L) 4.22 - 5.81 MIL/uL   Hemoglobin 11.0 (L) 13.0 - 17.0 g/dL   HCT 98.1 (L) 19.1 - 47.8 %   MCV 94.3 80.0 - 100.0 fL   MCH 31.3 26.0 - 34.0 pg   MCHC 33.2 30.0 - 36.0 g/dL   RDW 29.5 62.1 - 30.8 %   Platelets 176 150 - 400 K/uL   nRBC 0.0 0.0 - 0.2 %   Neutrophils Relative % 88 %   Neutro Abs 9.9 (H) 1.7 - 7.7 K/uL   Lymphocytes Relative 6 %   Lymphs Abs 0.6 (L) 0.7 - 4.0 K/uL   Monocytes Relative 4 %   Monocytes Absolute 0.5 0.1 - 1.0 K/uL   Eosinophils Relative 1 %   Eosinophils Absolute 0.1 0.0 - 0.5 K/uL   Basophils Relative 0 %   Basophils Absolute 0.0 0.0 - 0.1 K/uL   Immature Granulocytes 1 %   Abs Immature Granulocytes 0.09 (H) 0.00 - 0.07 K/uL    Comment: Performed at [facility], [address], [place], [place] [address]  Culture, blood (Routine X 2) w Reflex to ID Panel     Status: None   Collection Time: 07/16/22  5:08 PM   Specimen: BLOOD  Result Value Ref Range   Specimen Description BLOOD LEFT ANTECUBITAL    Special Requests      BOTTLES DRAWN AEROBIC AND ANAEROBIC Blood Culture adequate volume   Culture      NO GROWTH 5 DAYS Performed at [facility], [address], [place], [place] [address]    Report Status 07/21/2022 FINAL   Culture, blood (Routine X 2) w Reflex to ID Panel     Status: None   Collection Time: 07/16/22  5:09 PM   Specimen: BLOOD  Result Value Ref Range   Specimen Description BLOOD RIGHT ANTECUBITAL    Special Requests      BOTTLES DRAWN AEROBIC ONLY Blood Culture results may not be optimal due to an inadequate volume of blood received in culture bottles   Culture      NO GROWTH 5 DAYS Performed at [facility], [address], [place], [place] [address]    Report Status 07/21/2022 FINAL   Hepatic function  panel     Status: Abnormal   Collection Time: 07/17/22  8:35 PM  Result Value Ref Range   Total Protein 5.4 (L) 6.5 - 8.1 g/dL   Albumin 2.5 (L) 3.5 - 5.0 g/dL   AST 56 (H) 15 - 41 U/L   ALT 244 (H) 0 - 44 U/L   Alkaline Phosphatase 130 (H) 38 - 126 U/L   Total Bilirubin 0.4 0.3 - 1.2 mg/dL   Bilirubin, Direct 0.1 0.0 - 0.2 mg/dL   Indirect Bilirubin 0.3 0.3 - 0.9 mg/dL    Comment: Performed at [facility], [address], [place], [place] [address]  Renal function panel     Status: Abnormal   Collection Time: 07/18/22  2:51 AM  Result Value Ref Range   Sodium 141 135 - 145 mmol/L   Potassium 3.2 (L) 3.5 - 5.1 mmol/L   Chloride 109 98 - 111 mmol/L   CO2 22 22 - 32 mmol/L   Glucose, Bld 99 70 - 99 mg/dL    Comment: Glucose reference range applies only to samples taken after fasting for at least 8  hours.   BUN 24 (H) 8 - 23 mg/dL   Creatinine, Ser 1.61 (H) 0.61 - 1.24 mg/dL   Calcium 8.3 (L) 8.9 - 10.3 mg/dL   Phosphorus 3.5 2.5 - 4.6 mg/dL   Albumin 2.6 (L) 3.5 - 5.0 g/dL   GFR, Estimated 53 (L) >60 mL/min    Comment: (NOTE) Calculated using the CKD-EPI Creatinine Equation (2021)    Anion gap 10 5 - 15    Comment: Performed at [facility], [address], [place], [place] [address]  CBC with Differential/Platelet     Status: Abnormal   Collection Time: 07/18/22  2:51 AM  Result Value Ref Range   WBC 9.4 4.0 - 10.5 K/uL   RBC 3.73 (L) 4.22 - 5.81 MIL/uL   Hemoglobin 11.4 (L) 13.0 - 17.0 g/dL   HCT 54.0 (L) 98.1 - 19.1 %   MCV 92.0 80.0 - 100.0 fL   MCH 30.6 26.0 - 34.0 pg   MCHC 33.2 30.0 - 36.0 g/dL   RDW 47.8 29.5 - 62.1 %   Platelets 194 150 - 400 K/uL   nRBC 0.0 0.0 - 0.2 %   Neutrophils Relative % 64 %   Neutro Abs 5.9 1.7 - 7.7 K/uL   Lymphocytes Relative 21 %   Lymphs Abs 1.9 0.7 - 4.0 K/uL   Monocytes Relative 10 %   Monocytes Absolute 1.0 0.1 - 1.0 K/uL   Eosinophils Relative 3 %   Eosinophils Absolute 0.3 0.0 - 0.5 K/uL   Basophils Relative 0  %   Basophils Absolute 0.0 0.0 - 0.1 K/uL   Immature Granulocytes 2 %   Abs Immature Granulocytes 0.18 (H) 0.00 - 0.07 K/uL    Comment: Performed at [facility], [address] [place], [place] [address]  Magnesium     Status: None   Collection Time: 07/18/22  2:51 AM  Result Value Ref Range   Magnesium 1.7 1.7 - 2.4 mg/dL    Comment: Performed at [facility], [address], [place], [place] [address]  CBC with Differential/Platelet     Status: Abnormal   Collection Time: 07/31/22 10:22 AM  Result Value Ref Range   WBC 7.2 3.4 - 10.8 x10E3/uL   RBC 3.41 (L) 4.14 - 5.80 x10E6/uL   Hemoglobin 10.5 (L) 13.0 - 17.7 g/dL   Hematocrit 62.9 (L) 52.8 - 51.0 %   MCV 100 (H) 79 - 97 fL   MCH 30.8 26.6 - 33.0 pg   MCHC 30.9 (L) 31.5 - 35.7 g/dL   RDW 41.3 24.4 - 01.0 %   Platelets 395 150 - 450 x10E3/uL   Neutrophils 67 Not Estab. %   Lymphs 20 Not Estab. %   Monocytes 9 Not Estab. %   Eos 3 Not Estab. %   Basos 1 Not Estab. %   Neutrophils Absolute 4.8 1.4 - 7.0 x10E3/uL   Lymphocytes Absolute 1.4 0.7 - 3.1 x10E3/uL   Monocytes Absolute 0.7 0.1 - 0.9 x10E3/uL   EOS (ABSOLUTE) 0.2 0.0 - 0.4 x10E3/uL   Basophils Absolute 0.1 0.0 - 0.2 x10E3/uL   Immature Granulocytes 0 Not Estab. %   Immature Grans (Abs) 0.0 0.0 - 0.1 x10E3/uL  CMP14+EGFR     Status: Abnormal   Collection Time: 07/31/22 10:22 AM  Result Value Ref Range   Glucose 90 70 - 99 mg/dL   BUN 30 10 - 36 mg/dL   Creatinine, Ser 2.72 0.76 - 1.27 mg/dL   eGFR  53 (L) >59 mL/min/1.73   BUN/Creatinine Ratio 24 10 - 24   Sodium 144 134 - 144 mmol/L   Potassium 5.1 3.5 - 5.2 mmol/L   Chloride 107 (H) 96 - 106 mmol/L   CO2 20 20 - 29 mmol/L   Calcium 9.7 8.6 - 10.2 mg/dL   Total Protein 5.9 (L) 6.0 - 8.5 g/dL   Albumin 3.9 3.6 - 4.6 g/dL   Globulin, Total 2.0 1.5 - 4.5 g/dL   Bilirubin Total 0.3 0.0 - 1.2 mg/dL   Alkaline Phosphatase 108 44 - 121 IU/L   AST 17 0 - 40 IU/L   ALT 25 0 - 44 IU/L  Anemia Profile  B     Status: Abnormal   Collection Time: 08/26/22  3:15 PM  Result Value Ref Range   Total Iron Binding Capacity 221 (L) 250 - 450 ug/dL   UIBC 811 914 - 782 ug/dL   Iron 47 38 - 956 ug/dL   Iron Saturation 21 15 - 55 %   Ferritin 434 (H) 30 - 400 ng/mL   Vitamin B-12 364 232 - 1,245 pg/mL   Folate 15.6 >3.0 ng/mL    Comment: A serum folate concentration of less than 3.1 ng/mL is considered to represent clinical deficiency.    WBC 8.8 3.4 - 10.8 x10E3/uL   RBC 3.69 (L) 4.14 - 5.80 x10E6/uL   Hemoglobin 11.5 (L) 13.0 - 17.7 g/dL   Hematocrit 21.3 (L) 08.6 - 51.0 %   MCV 97 79 - 97 fL   MCH 31.2 26.6 - 33.0 pg   MCHC 32.3 31.5 - 35.7 g/dL   RDW 57.8 46.9 - 62.9 %   Platelets 305 150 - 450 x10E3/uL   Neutrophils 71 Not Estab. %   Lymphs 17 Not Estab. %   Monocytes 8 Not Estab. %   Eos 3 Not Estab. %   Basos 1 Not Estab. %   Neutrophils Absolute 6.3 1.4 - 7.0 x10E3/uL   Lymphocytes Absolute 1.5 0.7 - 3.1 x10E3/uL   Monocytes Absolute 0.7 0.1 - 0.9 x10E3/uL   EOS (ABSOLUTE) 0.2 0.0 - 0.4 x10E3/uL   Basophils Absolute 0.1 0.0 - 0.2 x10E3/uL   Immature Granulocytes 0 Not Estab. %   Immature Grans (Abs) 0.0 0.0 - 0.1 x10E3/uL   Retic Ct Pct 1.4 0.6 - 2.6 %  Fecal occult blood, imunochemical([facility])     Status: None   Collection Time: 08/29/22  8:23 AM   Specimen: Stool   ST  Result Value Ref Range   Fecal Occult Bld Negative Negative  Urinalysis, Routine w reflex microscopic     Status: Abnormal   Collection Time: 09/18/22  1:00 PM  Result Value Ref Range   Specific Gravity, UA 1.020 1.005 - 1.030   pH, UA 5.5 5.0 - 7.5   Color, UA Yellow Yellow   Appearance Ur Clear Clear   Leukocytes,UA Negative Negative   Protein,UA 1+ (A) Negative/Trace   Glucose, UA Negative Negative   Ketones, UA Negative Negative   RBC, UA Negative Negative   Bilirubin, UA Negative Negative   Urobilinogen, Ur 0.2 0.2 - 1.0 mg/dL   Nitrite, UA Negative Negative   Microscopic  Examination See below:     Comment: Microscopic was indicated and was performed.  Microscopic Examination     Status: None   Collection Time: 09/18/22  1:00 PM   Urine  Result Value Ref Range   WBC, UA 0-5 0 - 5 /hpf   RBC, Urine  0-2 0 - 2 /hpf   Epithelial Cells (non renal) 0-10 0 - 10 /hpf   Bacteria, UA None seen None seen/Few  UA is unremarkable.  Studies/Results: No results found. DG CHEST PORT 1 VIEW  Result Date: 07/14/2022 CLINICAL DATA:  Provided history: Leukocytosis. EXAM: PORTABLE CHEST 1 VIEW COMPARISON:  Prior chest radiographs 02/22/2016 and earlier. FINDINGS: Heart size within normal limits. Aortic atherosclerosis. No appreciable airspace consolidation. No evidence of pleural effusion or pneumothorax. No acute osseous abnormality identified. Redemonstrated chronic left rib fracture deformities. IMPRESSION: 1. No evidence of an acute cardiopulmonary abnormality. 2. Aortic Atherosclerosis (ICD10-I70.0). Electronically Signed   By: Jackey Loge D.O.   On: 07/14/2022 11:18   MR ABDOMEN MRCP W WO CONTAST  Result Date: 07/14/2022 CLINICAL DATA:  Right upper quadrant abdominal pain. EXAM: MRI ABDOMEN WITHOUT AND WITH CONTRAST (INCLUDING MRCP) TECHNIQUE: Multiplanar multisequence MR imaging of the abdomen was performed both before and after the administration of intravenous contrast. Heavily T2-weighted images of the biliary and pancreatic ducts were obtained, and three-dimensional MRCP images were rendered by post processing. CONTRAST:  7mL GADAVIST GADOBUTROL 1 MMOL/ML IV SOLN COMPARISON:  07/12/2022 FINDINGS: Exam detail is significantly diminished secondary to motion artifact. Lower chest: Small right pleural effusion. Hepatobiliary: Obscured by motion artifact. Numerous small T2 hyperintense foci are scattered throughout both lobes of liver which are favored to represent multiple small cysts and or biliary hamartomas. The largest of these is in segment 3 measuring 1.3 cm, image 18/4.  Diffuse periportal edema as noted on the MRI from earlier today. Normal appearance of the gallbladder. No gallstones. No significant gallbladder wall thickening. No intrahepatic bile duct dilatation. 8 mm choledochal cyst arises off the common bile duct, image 61/8. At the level of the head of pancreas the distal CBD measures 4 mm. There is abrupt cut off of the head of pancreas where a possible 3 mm distal common bile duct stone is noted, image 62/8 Pancreas: No signs of pancreatic inflammation or main duct dilatation. No suspicious mass identified. Spleen: Within normal limits in size and appearance. No focal splenic abnormality. Adrenals/Urinary Tract:  Normal appearance of the adrenal glands. -There are numerous bilateral uniformly T2 hyperintense kidney cysts compatible with Bosniak class 1 and 2 lesions. The largest arises off the anterior cortex of the right kidney measuring 8.1 x 7.6 cm, image 35/4. -Solid enhancing lesion arising off the posterior cortex of the left kidney measures 1.1 cm, image 74/1101. -exophytic lesion arising off the inferior pole of the left kidney measures 1.5 cm, image 81/1101. This is T1 hyperintense, T2 isointense and exhibits mild progressive enhancement on the postcontrast images. -Arising off the anteromedial cortex of the left kidney is a exophytic lesion measuring 1 cm, image 33/4. On the postcontrast imaging this shows diffuse enhancement, image 74/1102. Additional remaining bilateral kidney lesions measure less than 1 cm and are technically too small to reliably characterize. No hydronephrosis identified bilaterally. Stomach/Bowel: Visualized portions within the abdomen are unremarkable. Vascular/Lymphatic: Normal caliber of the abdominal aorta. Aneurysmal dilatation of the portal vein is identified as noted on the exam from earlier today. The extrahepatic portal vein measures 5.4 x 3.4 cm proximal to the confluence. No portal venous thrombosis identified. No enlarged lymph  nodes. Other:  No ascites or focal fluid collections. Musculoskeletal: No suspicious bone lesions identified. IMPRESSION: 1. Exam detail is significantly diminished secondary to motion artifact. 2. There is abrupt cut off of the distal CBD at the level of the head of pancreas  where a possible 3 mm distal common bile duct stone is noted. No significant bile duct dilatation. Correlate for any clinical signs/symptoms of choledocholithiasis. 3. 8 mm choledochal cyst arises off the common bile duct. 4. Aneurysmal dilatation of the portal vein as noted on the exam from earlier today. No portal venous thrombosis identified. 5. Numerous bilateral Bosniak class 1 and 2 kidney cysts. 6. There are 3 enhancing lesions arising off the inferior pole of the left kidney which measure up to 1.5 cm. These are concerning for small renal cell carcinomas. No signs solid organ or nodal metastasis within the abdomen. 7. Small right pleural effusion. Electronically Signed   By: Signa Kell M.D.   On: 07/14/2022 05:22   CT ABDOMEN PELVIS W CONTRAST  Result Date: 07/13/2022 CLINICAL DATA:  Abdominal pain, acute, nonlocalized EXAM: CT ABDOMEN AND PELVIS WITH CONTRAST TECHNIQUE: Multidetector CT imaging of the abdomen and pelvis was performed using the standard protocol following bolus administration of intravenous contrast. RADIATION DOSE REDUCTION: This exam was performed according to the departmental dose-optimization program which includes automated exposure control, adjustment of the mA and/or kV according to patient size and/or use of iterative reconstruction technique. CONTRAST:  OMNIPAQUE IOHEXOL 300 MG/ML  SOLN COMPARISON:  None Available. FINDINGS: Lower chest: Extensive multi-vessel coronary artery calcification noted. Global cardiac size within normal limits. No pericardial effusion. Visualized lung bases are clear. Hepatobiliary: Tiny scattered hepatic hypodensities are seen throughout the liver which are too small to  accurately characterize but may represent tiny hepatic cyst or biliary hamartoma. No enhancing intrahepatic mass identified. Mild periportal edema. Mild intrahepatic biliary ductal dilation of the level of the dilated portal vein which demonstrates mass effect upon the proximal extrahepatic bile duct. The distal extrahepatic bile duct is of normal caliber. The gallbladder is distended, however, no superimposed pericholecystic inflammatory changes are identified. The extrahepatic main portal vein is markedly focally dilated, measuring 3.8 x 4.6 cm in diameter spanning a length of 5.2 cm. The nominal diameter of the portal vein is 1.7 cm proximal to the area of focal dilation. No intraluminal thrombus or perivascular inflammatory stranding or fluid collection identified. Pancreas: Unremarkable Spleen: Unremarkable Adrenals/Urinary Tract: The adrenal glands are unremarkable. The kidneys are normal in size and position. Multiple simple cortical cysts are seen within the kidneys bilaterally for which no follow-up imaging is recommended. However, and indeterminate exophytic 13 mm enhancing mass is seen arising from the posterior, interpolar region of the right kidney at axial image # 38/2 and a a 13 mm exophytic enhancing lesion is seen arising anteriorly from the lower pole the right kidney at axial image # 39/2 compatible with multiple small exophytic renal neoplasm. A indeterminate exophytic lesion is seen arising from the lower pole of the left kidney at axial image # 42/2. No hydronephrosis. No intrarenal or ureteral calculi. The bladder is unremarkable. Stomach/Bowel: Stomach is within normal limits. Appendix appears normal. No evidence of bowel wall thickening, distention, or inflammatory changes. Vascular/Lymphatic: Moderate aortoiliac atherosclerotic calcification. No aortic aneurysm. No pathologic adenopathy within the abdomen and pelvis. Reproductive: Prostatectomy has been performed. Other: No abdominal wall  hernia. Musculoskeletal: The osseous structures are age-appropriate. Bilateral laminectomy and resection of the spinous processes of L2 and L3 has been performed. Large calcified posterior disc herniations at these levels resultant severe canal stenosis. No acute bone abnormality. No lytic or blastic bone lesion. IMPRESSION: 1. Extensive multi-vessel coronary artery calcification. 2. Focally dilated extrahepatic main portal vein measuring 3.8 x 4.6 cm in  diameter spanning a length of 5.2 cm. The nominal diameter of the portal vein is 1.7 cm proximal to the area of focal dilation. No intraluminal thrombus or perivascular inflammatory stranding or fluid collection identified. 3. Mild gallbladder distension and intrahepatic biliary ductal dilation of the level of the dilated main portal vein which demonstrates mass effect upon the proximal extrahepatic bile duct. The distal extrahepatic bile duct is of normal caliber. Correlation with liver enzymes may be helpful to exclude an obstructive process. 4. Multiple exophytic enhancing masses arising from the lower pole the left kidney suspicious for multiple renal neoplasms. If indicated, dedicated renal mass protocol CT or MRI examination is recommended for further evaluation. 5. Status post prostatectomy. 6. Status post bilateral laminectomy and resection of the spinous processes of L2 and L3. Large calcified posterior disc herniations at these levels resultant severe canal stenosis. Aortic Atherosclerosis (ICD10-I70.0). Electronically Signed   By: Helyn Numbers M.D.   On: 07/13/2022 00:00     Assessment/Plan: Multiple small LLP renal lesions with concern for RCCa.   At this time I have recommended surveillance with an MRI in 3 months.  If there is further growth, we could have him evaluated for an ablative procedure.  Renal cysts.  He has multiple B1 and B2 bilateral cysts.  History of prostate cancer.  This is remote and he has not had PSA testing in 10 years.    No orders of the defined types were placed in this encounter.    Orders Placed This Encounter  Procedures   Microscopic Examination   MR Abdomen W Wo Contrast    Standing Status:   Future    Standing Expiration Date:   03/18/2023    Order Specific Question:   If indicated for the ordered procedure, I authorize the administration of contrast media per Radiology protocol    Answer:   Yes    Order Specific Question:   What is the patient's sedation requirement?    Answer:   No Sedation    Order Specific Question:   Does the patient have a pacemaker or implanted devices?    Answer:   No    Order Specific Question:   Preferred imaging location?    Answer:   [facility] (table limit [phone])   Urinalysis, Routine w reflex microscopic     Return in about 3 months (around 12/18/2022) for with MRI results. .    CC: Benjamin Rodney NP.     Bjorn Pippin 09/19/2022

## 2022-10-01 DIAGNOSIS — C44319 Basal cell carcinoma of skin of other parts of face: Secondary | ICD-10-CM | POA: Diagnosis not present

## 2022-10-07 ENCOUNTER — Telehealth: Payer: Self-pay | Admitting: Family

## 2022-10-09 NOTE — Telephone Encounter (Signed)
There is no exam to evaluate if he is safe. If his memory is changing and getting lost or not able to safely drive recommend him not to drive.   [name], FNP

## 2022-10-09 NOTE — Telephone Encounter (Signed)
Patient aware and verbalized understanding. °

## 2022-10-10 DIAGNOSIS — S51811A Laceration without foreign body of right forearm, initial encounter: Secondary | ICD-10-CM | POA: Diagnosis not present

## 2022-10-16 ENCOUNTER — Ambulatory Visit (INDEPENDENT_AMBULATORY_CARE_PROVIDER_SITE_OTHER): Payer: Medicare HMO | Admitting: Family

## 2022-10-16 ENCOUNTER — Encounter: Payer: Self-pay | Admitting: Family

## 2022-10-16 VITALS — BP 160/76 | HR 97 | Temp 98.0°F | Ht 70.0 in | Wt 129.0 lb

## 2022-10-16 DIAGNOSIS — S51011D Laceration without foreign body of right elbow, subsequent encounter: Secondary | ICD-10-CM | POA: Diagnosis not present

## 2022-10-16 DIAGNOSIS — I10 Essential (primary) hypertension: Secondary | ICD-10-CM | POA: Diagnosis not present

## 2022-10-16 DIAGNOSIS — W19XXXD Unspecified fall, subsequent encounter: Secondary | ICD-10-CM | POA: Diagnosis not present

## 2022-10-16 DIAGNOSIS — Y92009 Unspecified place in unspecified non-institutional (private) residence as the place of occurrence of the external cause: Secondary | ICD-10-CM

## 2022-10-16 NOTE — Patient Instructions (Signed)
 Skin Tear A skin tear is a wound in which the top layers of skin have peeled off from the deeper skin or tissues under the skin. This is a common problem as people get older because the skin becomes thinner and easier to break. Also, some medicines, such as oral corticosteroids, can lead to thinning skin if they are taken for long. Skin tears are often caused by: Falls. Removing tape strips from a wound. Skin tears may be partial or full. The skin may be slit open (with the skin separated from the wound but still attached) or the skin may be completely torn away. Putting the torn skin back to cover the wound helps the skin tear to heal. A skin tear is often repaired with skin tape or tape strips. Depending on the location of the wound and the amount of drainage, a bandage may be put over a skin tape or tape strip. Follow these instructions at home: Wound care  Clean the wound as told by your health care provider. You may be told to keep the wound dry for the first few days. If you are told to clean the wound: Wash the wound as told. This may include using mild soap and water, a wound cleanser, or a saltwater or saline solution. Do not use hydrogen peroxide or alcohol. These can slow healing. If using soap, rinse the wound with water to remove all soap. Do not rub the wound dry. Pat it gently with a clean towel or let it air-dry. Change any bandages. This includes changing the bandage if it gets wet, gets dirty, or starts to smell bad. Use your antibiotic ointment if you are told. To change your bandage: Wash your hands with soap and water for at least 20 seconds before and after you change your bandage. If soap and water are not available, use hand sanitizer. Leave tape strips or bandages in place. These may need to stay in place for days or even weeks. If tape strip edges start to loosen and curl up, you may trim the loose edges. Do not remove tape strips or bandages unless your provider tells you  to do that. Check your wound every day for signs of infection. Check for: Redness, swelling, or pain. More fluid or blood. Warmth. Pus or a bad smell. Do not scratch or pick at the wound. Protect the injured area until it's healed. Medicines Take over-the-counter and prescription medicines only as told by your provider. If you were prescribed antibiotics, take or apply them as told by your provider. Do not stop using the antibiotic even if you start to feel better. General instructions Keep the dressing dry as told by your provider. Do not take baths, swim, use a hot tub, or do anything that puts your wound underwater until your provider approves. Ask your provider if you may take showers. You may only be allowed to take sponge baths. Contact a health care provider if: You have redness, swelling, or pain around your wound. You have more fluid or blood coming from your wound. Your wound, or the area around your wound, feels warm to the touch. You have pus or a bad smell coming from your wound. Get help right away if: You have a red streak that goes away from your wound. You have a fever and chills, and your symptoms suddenly get worse. This information is not intended to replace advice given to you by your health care provider. Make sure you discuss any questions you have  with your health care provider. Document Revised: 03/12/2022 Document Reviewed: 03/12/2022 Elsevier Patient Education  2024 [employer].

## 2022-10-16 NOTE — Progress Notes (Signed)
Subjective:    [name], male    DOB: Jun 02, 1928, 89 y.o.   MRN: [id number]  Chief Complaint  Patient presents with   Fall    Hurt arm    Pt presents to the office today after a fall. He went out on his porch on 10/10/22 and slipped and fell. He landed on his right arm. He went to the Urgent Care on Saturday and was treated for skin tear. He reports he is doing well.  Fall The accident occurred 3 to 5 days ago. The pain is mild. He has tried rest for the symptoms. The treatment provided mild relief.  Hypertension The current episode started more than 1 year ago. The problem has been waxing and waning since onset. The problem is uncontrolled. The current treatment provides mild improvement.      Review of Systems     Objective:   Physical Exam Vitals reviewed.  Constitutional:      General: He is not in acute distress.    Appearance: He is well-developed.  HENT:     Head: Normocephalic.  Eyes:     General:        Right eye: No discharge.        Left eye: No discharge.     Pupils: Pupils are equal, round, and reactive to light.  Neck:     Thyroid: No thyromegaly.  Cardiovascular:     Rate and Rhythm: Normal rate and regular rhythm.     Heart sounds: Normal heart sounds. No murmur heard. Pulmonary:     Effort: Pulmonary effort is normal. No respiratory distress.     Breath sounds: Normal breath sounds. No wheezing.  Abdominal:     General: Bowel sounds are normal. There is no distension.     Palpations: Abdomen is soft.     Tenderness: There is no abdominal tenderness.  Musculoskeletal:        General: No tenderness. Normal range of motion.     Cervical back: Normal range of motion and neck supple.  Skin:    General: Skin is warm and dry.     Findings: No erythema or rash.          Comments: Skin tear on right forearm  Neurological:     Mental Status: He is alert and oriented to person, place, and time.     Cranial Nerves: No cranial  nerve deficit.     Deep Tendon Reflexes: Reflexes are normal and symmetric.  Psychiatric:        Behavior: Behavior normal.        Thought Content: Thought content normal.        Judgment: Judgment normal.      BP (!) 160/76   Pulse 97   Temp 98 F (36.7 C) (Temporal)   Ht 5\' 10"  (1.778 m)   Wt 129 lb (58.5 kg)   SpO2 96%   BMI 18.51 kg/m        Assessment & Plan:  Benjamin Buchanan comes in today with chief complaint of Fall (Hurt arm )   Diagnosis and orders addressed:  1. Skin tear of right elbow without complication, subsequent encounter   2. Fall in home, subsequent encounter   3. Essential hypertension Elevated today, if continues to be elevated will need to adjust medications    Vaseline gauze placed Area is looking great! No signs of infection Keep clean and dry Fall precautions discussed  Follow up if symptoms  worsen or do not improve    [name], FNP

## 2022-11-07 ENCOUNTER — Other Ambulatory Visit: Payer: Self-pay | Admitting: Family

## 2022-11-24 DIAGNOSIS — Z85828 Personal history of other malignant neoplasm of skin: Secondary | ICD-10-CM | POA: Diagnosis not present

## 2022-11-24 DIAGNOSIS — C4441 Basal cell carcinoma of skin of scalp and neck: Secondary | ICD-10-CM | POA: Diagnosis not present

## 2022-11-24 DIAGNOSIS — Z08 Encounter for follow-up examination after completed treatment for malignant neoplasm: Secondary | ICD-10-CM | POA: Diagnosis not present

## 2022-11-25 ENCOUNTER — Emergency Department (HOSPITAL_COMMUNITY)
Admission: EM | Admit: 2022-11-25 | Discharge: 2022-11-25 | Disposition: A | Discharge status: Home / Self Care | Payer: Medicare HMO | Attending: Student | Admitting: Student

## 2022-11-25 ENCOUNTER — Other Ambulatory Visit: Payer: Self-pay

## 2022-11-25 DIAGNOSIS — T148XXA Other injury of unspecified body region, initial encounter: Secondary | ICD-10-CM

## 2022-11-25 DIAGNOSIS — L7622 Postprocedural hemorrhage and hematoma of skin and subcutaneous tissue following other procedure: Secondary | ICD-10-CM | POA: Insufficient documentation

## 2022-11-25 DIAGNOSIS — Z7982 Long term (current) use of aspirin: Secondary | ICD-10-CM | POA: Diagnosis not present

## 2022-11-25 DIAGNOSIS — N183 Chronic kidney disease, stage 3 unspecified: Secondary | ICD-10-CM | POA: Insufficient documentation

## 2022-11-25 DIAGNOSIS — Z8546 Personal history of malignant neoplasm of prostate: Secondary | ICD-10-CM | POA: Diagnosis not present

## 2022-11-25 DIAGNOSIS — S21119A Laceration without foreign body of unspecified front wall of thorax without penetration into thoracic cavity, initial encounter: Secondary | ICD-10-CM | POA: Diagnosis not present

## 2022-11-25 DIAGNOSIS — R58 Hemorrhage, not elsewhere classified: Secondary | ICD-10-CM | POA: Diagnosis not present

## 2022-11-25 DIAGNOSIS — Z79899 Other long term (current) drug therapy: Secondary | ICD-10-CM | POA: Insufficient documentation

## 2022-11-25 DIAGNOSIS — I129 Hypertensive chronic kidney disease with stage 1 through stage 4 chronic kidney disease, or unspecified chronic kidney disease: Secondary | ICD-10-CM | POA: Insufficient documentation

## 2022-11-25 DIAGNOSIS — Z743 Need for continuous supervision: Secondary | ICD-10-CM | POA: Diagnosis not present

## 2022-11-25 DIAGNOSIS — I10 Essential (primary) hypertension: Secondary | ICD-10-CM | POA: Diagnosis not present

## 2022-11-25 MED ORDER — LIDOCAINE-EPINEPHRINE (PF) 2 %-1:200000 IJ SOLN
INTRAMUSCULAR | Status: AC
  Administered 2022-11-25: 20 mL via INTRAMUSCULAR
  Filled 2022-11-25: qty 20

## 2022-11-25 NOTE — ED Notes (Signed)
Reviewed D/C information with the patient, pt verbalized understanding. No additional concerns at this time.

## 2022-11-25 NOTE — ED Notes (Signed)
Patient denies pain and is resting comfortably.  

## 2022-11-25 NOTE — ED Notes (Signed)
Pts daughter Marisa Cyphers) provided an update on the patients D/C.

## 2022-11-25 NOTE — ED Notes (Signed)
Safe transport contacted to transfer pt home at this time.

## 2022-11-25 NOTE — ED Triage Notes (Signed)
Pt had skin tag removed at top of chest yesterday, pt noticed it was actively bleeding approx 2 hours ago. Pt is on blood thinners. Dr. Posey Rea at bedside

## 2022-11-25 NOTE — ED Provider Notes (Signed)
EMERGENCY DEPARTMENT AT [facility] Provider Note  CSN: [id number] Arrival date & time: 11/25/22 0201  Chief Complaint(s) Post-op Problem  HPI Benjamin Buchanan is a 89 y.o. male with PMH anemia, HTN, HLD, CKD 3, prostate cancer, recent hospitalization in July 2024 for cholangitis who presents emergency department for evaluation of bleeding from a chest wound.  Patient reportedly had some sort of skin lesion removed from the anterior chest wall near the sternum today but has had persistent bleeding from the wound.  He states that he is on blood thinners but I do not see any evidence of this in his chart.  He denies exertional shortness of breath, pain from the wound, nausea, vomiting or any other systemic symptoms.  Patient arrives with copious dried blood and blood clotting under a bandage over the chest wound.   Past Medical History Past Medical History:  Diagnosis Date   Anemia    History of prostate cancer 1994   HLD (hyperlipidemia)    HTN (hypertension)    PVC (premature ventricular contraction)    history of   Patient Active Problem List   Diagnosis Date Noted   Nausea 07/13/2022   Transaminitis 07/13/2022   Elevated lipase 07/13/2022   Absolute anemia 11/22/2018   Stage 3a chronic kidney disease (HCC) 11/22/2018   Spinal stenosis of lumbar region at multiple levels 02/10/2018   Degeneration of lumbosacral intervertebral disc 10/21/2017   LBBB (left bundle branch block) 09/05/2016   Vitamin D deficiency 02/21/2015   Thoracic aortic atherosclerosis (HCC) 02/07/2014   History of prostate cancer 09/06/2012   Essential hypertension 01/20/2012   Mixed hyperlipidemia 01/20/2012   Cardiovascular risk factor 01/20/2012   PVC's (premature ventricular contractions) 01/20/2012   Home Medication(s) Prior to Admission medications   Medication Sig Start Date End Date Taking? Authorizing Provider  acetaminophen (TYLENOL) 325 MG tablet Take 325 mg by mouth 2  (two) times daily as needed for moderate pain.    [provider]  amLODipine (NORVASC) 10 MG tablet TAKE 1 TABLET BY MOUTH EVERY DAY 09/18/22   [name] A, FNP  aspirin 81 MG tablet Take 81 mg by mouth every Monday, Wednesday, and Friday.     [provider]  benazepril (LOTENSIN) 20 MG tablet TAKE 1 TABLET BY MOUTH EVERY DAY 11/07/22   [name] A, FNP  Cholecalciferol 1000 UNITS capsule Take 1,000 Units by mouth daily.    [provider]  Omega-3 Fatty Acids (FISH OIL PO) Take 1 tablet by mouth daily.    [provider]  [facility] ULTRA test strip CHECK BLOOD SUGAR EVERY DAY AND AS NEEDED 09/20/21   [name], FNP                                                                                                                                    Past Surgical History Past Surgical History:  Procedure Laterality  Date   HERNIA REPAIR     INGUINAL HERNIA REPAIR Left 10/23/2016   Procedure: LEFT INGUINAL HERNIA REPAIR WITH MESH  ;  Surgeon: Emelia Loron, MD;  Location: [facility];  Service: General;  Laterality: Left;   INSERTION OF MESH Left 10/23/2016   Procedure: INSERTION OF MESH;  Surgeon: Emelia Loron, MD;  Location: [facility];  Service: General;  Laterality: Left;   LUMBAR LAMINECTOMY/DECOMPRESSION MICRODISCECTOMY N/A 02/10/2018   Procedure: L2-3, L3-4 and L4-5 complete decompressive lumbar laminectomies for spinal stenosis, Foraminotomies  for L3, L4, L5 nerve roots.;  Surgeon: Ranee Gosselin, MD;  Location: [facility];  Service: Orthopedics;  Laterality: N/A;    PROSTATE SURGERY     Family History Family History  Problem Relation Age of Onset   Cancer Father        prostate cancer   Hypertension Other        family history    Social History Social History   Tobacco Use   Smoking status: Never   Smokeless tobacco: Never  Vaping Use   Vaping status: Never Used  Substance Use Topics    Alcohol use: No   Drug use: No   Allergies Lipitor [atorvastatin]  Review of Systems Review of Systems  Skin:  Positive for wound.    Physical Exam Vital Signs  I have reviewed the triage vital signs BP (!) 166/92   Pulse 64   Temp 97.9 F (36.6 C) (Oral)   Resp 18   SpO2 98%   Physical Exam Constitutional:      General: He is not in acute distress.    Appearance: Normal appearance.  HENT:     Head: Normocephalic and atraumatic.     Nose: No congestion or rhinorrhea.  Eyes:     General:        Right eye: No discharge.        Left eye: No discharge.     Extraocular Movements: Extraocular movements intact.     Pupils: Pupils are equal, round, and reactive to light.  Cardiovascular:     Rate and Rhythm: Normal rate and regular rhythm.     Heart sounds: No murmur heard. Pulmonary:     Effort: No respiratory distress.     Breath sounds: No wheezing or rales.  Abdominal:     General: There is no distension.     Tenderness: There is no abdominal tenderness.  Musculoskeletal:        General: Normal range of motion.     Cervical back: Normal range of motion.  Skin:    General: Skin is warm and dry.     Findings: Lesion present.  Neurological:     General: No focal deficit present.     Mental Status: He is alert.     ED Results and Treatments Labs (all labs ordered are listed, but only abnormal results are displayed) Labs Reviewed - No data to display  Radiology No results found.  Pertinent labs & imaging results that were available during my care of the patient were reviewed by me and considered in my medical decision making (see MDM for details).  Medications Ordered in ED Medications  lidocaine-EPINEPHrine (XYLOCAINE W/EPI) 2 %-1:200000 (PF) injection (20 mLs Injection Given 11/25/22 0209)                                                                                                                                      Procedures .Marland KitchenLaceration Repair  Date/Time: 11/25/2022 6:43 PM  Performed by: Glendora Score, MD Authorized by: Glendora Score, MD   Anesthesia:    Anesthesia method:  Local infiltration   Local anesthetic:  Lidocaine 2% WITH epi Laceration details:    Location:  Trunk   Trunk location: chest.   Length (cm):  3 Exploration:    Hemostasis achieved with:  Epinephrine and direct pressure Treatment:    Amount of cleaning:  Standard Skin repair:    Repair method:  Sutures   Suture size:  4-0   Suture material:  Prolene and nylon   Suture technique:  Figure eight   Number of sutures:  2 Approximation:    Approximation:  Close Repair type:    Repair type:  Simple Post-procedure details:    Dressing:  Non-adherent dressing   Procedure completion:  Tolerated well, no immediate complications   (including critical care time)  Medical Decision Making / ED Course   This patient presents to the ED for concern of chest wound, this involves an extensive number of treatment options, and is a complaint that carries with it a high risk of complications and morbidity.  The differential diagnosis includes venous oozing from the superficial chest vessel, arterial injury, hematoma, contusion  MDM: Patient seen emergency room for evaluation of bleeding from a surgical chest wound.  The physical exam with a 3 cm area of recently removed tissue with appropriate granulation tissue but persistent bruising from the 4 o'clock position.  Direct pressure attempted without improvement.  Quick clot gauze used with extended direct pressure and we were still unable to obtain hemostasis.  I then infiltrated lidocaine with epinephrine and attempted ligature with a figure-of-eight Prolene suture.  This improved his bleeding but it was unfortunately persistent.  I then upsized the suture and used a nylon suture, throwing an  additional figure-of-eight and we were able to achieve hemostasis.  Patient was observed for multiple hours without return of bleeding.  Patient then discharged with outpatient follow-up in 1 week to have sutures removed.   Additional history obtained:  -External records from outside source obtained and reviewed including: Chart review including previous notes, labs, imaging, consultation notes   Medicines ordered and prescription drug management: Meds ordered this encounter  Medications   lidocaine-EPINEPHrine (XYLOCAINE W/EPI) 2 %-1:200000 (PF) injection    [place]st Gleason N: cabinet override    -I have reviewed  the patients home medicines and have made adjustments as needed  Critical interventions none     Cardiac Monitoring: The patient was maintained on a cardiac monitor.  I personally viewed and interpreted the cardiac monitored which showed an underlying rhythm of: NSR  Social Determinants of Health:  Factors impacting patients care include: none   Reevaluation: After the interventions noted above, I reevaluated the patient and found that they have :improved  Co morbidities that complicate the patient evaluation  Past Medical History:  Diagnosis Date   Anemia    History of prostate cancer 1994   HLD (hyperlipidemia)    HTN (hypertension)    PVC (premature ventricular contraction)    history of      Dispostion: I considered admission for this patient, but at this time with hemostasis achieved he does not meet inpatient criteria for admission he is safe for discharge with outpatient follow-up     Final Clinical Impression(s) / ED Diagnoses Final diagnoses:  Bleeding from wound     @PCDICTATION @    Glendora Score, MD 11/25/22 1846

## 2022-11-25 NOTE — ED Notes (Signed)
Son came to pick up patient. Pt d/c at this time.

## 2022-11-27 ENCOUNTER — Encounter: Payer: Self-pay | Admitting: Family

## 2022-11-27 ENCOUNTER — Ambulatory Visit: Payer: Medicare HMO | Admitting: Family

## 2022-11-27 VITALS — BP 147/74 | HR 92 | Temp 97.5°F | Ht 70.0 in | Wt 126.8 lb

## 2022-11-27 DIAGNOSIS — I700 Atherosclerosis of aorta: Secondary | ICD-10-CM

## 2022-11-27 DIAGNOSIS — I10 Essential (primary) hypertension: Secondary | ICD-10-CM | POA: Diagnosis not present

## 2022-11-27 DIAGNOSIS — M48061 Spinal stenosis, lumbar region without neurogenic claudication: Secondary | ICD-10-CM | POA: Diagnosis not present

## 2022-11-27 DIAGNOSIS — E782 Mixed hyperlipidemia: Secondary | ICD-10-CM

## 2022-11-27 DIAGNOSIS — N1831 Chronic kidney disease, stage 3a: Secondary | ICD-10-CM | POA: Diagnosis not present

## 2022-11-27 DIAGNOSIS — C4491 Basal cell carcinoma of skin, unspecified: Secondary | ICD-10-CM

## 2022-11-27 LAB — CBC WITH DIFFERENTIAL/PLATELET
Basophils Absolute: 0.1 10*3/uL (ref 0.0–0.2)
Basos: 1 %
EOS (ABSOLUTE): 0.2 10*3/uL (ref 0.0–0.4)
Eos: 2 %
Hematocrit: 34.4 % — ABNORMAL LOW (ref 37.5–51.0)
Hemoglobin: 10.9 g/dL — ABNORMAL LOW (ref 13.0–17.7)
Immature Grans (Abs): 0 10*3/uL (ref 0.0–0.1)
Immature Granulocytes: 0 %
Lymphocytes Absolute: 1.1 10*3/uL (ref 0.7–3.1)
Lymphs: 15 %
MCH: 30.4 pg (ref 26.6–33.0)
MCHC: 31.7 g/dL (ref 31.5–35.7)
MCV: 96 fL (ref 79–97)
Monocytes Absolute: 0.5 10*3/uL (ref 0.1–0.9)
Monocytes: 7 %
Neutrophils Absolute: 5.7 10*3/uL (ref 1.4–7.0)
Neutrophils: 75 %
Platelets: 295 10*3/uL (ref 150–450)
RBC: 3.59 x10E6/uL — ABNORMAL LOW (ref 4.14–5.80)
RDW: 12.6 % (ref 11.6–15.4)
WBC: 7.6 10*3/uL (ref 3.4–10.8)

## 2022-11-27 LAB — CMP14+EGFR
ALT: 13 [IU]/L (ref 0–44)
AST: 21 [IU]/L (ref 0–40)
Albumin: 4 g/dL (ref 3.6–4.6)
Alkaline Phosphatase: 70 [IU]/L (ref 44–121)
BUN/Creatinine Ratio: 23 (ref 10–24)
BUN: 28 mg/dL (ref 10–36)
Bilirubin Total: 0.3 mg/dL (ref 0.0–1.2)
CO2: 20 mmol/L (ref 20–29)
Calcium: 9.1 mg/dL (ref 8.6–10.2)
Chloride: 108 mmol/L — ABNORMAL HIGH (ref 96–106)
Creatinine, Ser: 1.2 mg/dL (ref 0.76–1.27)
Globulin, Total: 2.1 g/dL (ref 1.5–4.5)
Glucose: 94 mg/dL (ref 70–99)
Potassium: 4 mmol/L (ref 3.5–5.2)
Sodium: 144 mmol/L (ref 134–144)
Total Protein: 6.1 g/dL (ref 6.0–8.5)
eGFR: 57 mL/min/{1.73_m2} — ABNORMAL LOW (ref >59)

## 2022-11-27 MED ORDER — ROSUVASTATIN CALCIUM 5 MG PO TABS
5.0000 mg | ORAL_TABLET | Freq: Every day | ORAL | 3 refills | Status: DC
Start: 2022-11-27 — End: 2023-12-04

## 2022-11-27 NOTE — Patient Instructions (Signed)
 Atherosclerosis  Atherosclerosis is when plaque builds up in the arteries. This causes narrowing and hardening of the arteries. Arteries are blood vessels that carry blood from the heart to all parts of the body. This blood contains oxygen. Plaque occurs due to inflammation or from a buildup of fat, cholesterol, calcium, waste products of cells, and a clotting material in the blood (fibrin). Plaque decreases the amount of blood that can flow through the artery. Atherosclerosis can affect any artery in your body, including: Heart arteries. Damage to these arteries may lead to coronary artery disease, which can cause a heart attack. Brain arteries. Damage to these arteries may cause a stroke. Leg, arm, and pelvis arteries. Peripheral artery disease (PAD) may result from damage to these arteries. Kidney arteries. Kidney (renal) failure may result from damage to kidney arteries. Treatment may slow the disease and prevent further damage to your heart, brain, peripheral arteries, and kidneys. What are the causes? This condition develops slowly over many years. The inner layers of your arteries become damaged and allow the gradual buildup of plaque. The exact cause of atherosclerosis is not fully understood. Symptoms of atherosclerosis do not occur until an artery becomes narrow or blocked. What increases the risk? The following factors may make you more likely to develop this condition: Being middle-aged or older. Certain medical conditions, including: High blood pressure. High cholesterol. High blood fats (triglycerides). Diabetes. Sleep apnea. Obesity. Certain lab levels, including: Elevated C-reactive protein (CRP). This is a sign of increased inflammation in your body. Elevated homocysteine levels. This is an amino acid that is associated with heart and blood vessel disease. Using tobacco or nicotine products. A family history of atherosclerosis. Not exercising enough (sedentary  lifestyle). Being stressed. Drinking too much alcohol or using drugs, such as cocaine or methamphetamine. What are the signs or symptoms? Symptoms of atherosclerosis do not occur until the plaque severely narrows or blocks the artery, which decreases blood flow. Sometimes, atherosclerosis does not cause symptoms. Symptoms of this condition include: Coronary artery disease. This may cause chest pain and shortness of breath. Decreased blood supply to your brain, which may cause a stroke. Signs of a stroke may include sudden: Weakness or numbness in your face, arm, or leg, especially on one side of your body. Trouble walking or difficulty moving your arms or legs. Loss of balance or coordination. Confusion. Slurred speech. Trouble speaking, or trouble understanding speech, or both (aphasia). Vision changes in one or both eyes. This may be double vision, blurred vision, or loss of vision. Severe headache with no known cause. The headache is often described as the worst headache ever experienced. PAD, which may cause pain, numbness, or nonhealing wounds, often in your legs and hips. Renal failure. This may cause tiredness, problems with urination, swelling, and itchy skin. How is this diagnosed? This condition is diagnosed based on your medical history and a physical exam. During the exam, your health care provider will: Check your pulse in different places. Listen for a "whooshing" sound over your arteries (bruit). You may also have tests, such as: Blood tests to check your levels of cholesterol, triglycerides, blood sugar, and CRP. Ankle-brachial index to compare blood pressure in your arms to blood pressure in your ankles to see how your blood is flowing. Heart (cardiac) tests. Electrocardiogram (ECG) to check for heart damage. Stress test to see how your heart reacts to exercise. Ultrasound tests. Ultrasound of your peripheral arteries to check blood flow. Echocardiogram to get images of  your heart's  chambers and valves. X-ray tests. Chest X-ray to see if you have an enlarged heart, which is a sign of heart failure. CT scan to check for damage to your heart, brain, or arteries. Angiogram. This is a test where dye is injected and X-rays are used to see the blood flow in the arteries. How is this treated? This condition is treated with lifestyle changes as the first step. These may include: Changing your diet. Losing weight. Reducing stress. Exercising and being physically active more regularly. Quitting smoking. You may also need medicine to: Lower triglycerides and cholesterol. Control blood pressure. Prevent blood clots. Lower inflammation in your body. Control your blood sugar. Sometimes, surgery is needed to: Remove plaque from an artery (endarterectomy). Open or widen a narrowed heart artery or peripheral artery (angioplasty). Create a new path for your blood with one of these procedures: Heart (coronary) artery bypass graft surgery. Peripheral artery bypass graft surgery. Place a small mesh tube (stent) in an artery to open or widen a narrowed artery. Follow these instructions at home: Eating and drinking  Eat a heart-healthy diet. Talk with your health care provider or a dietitian if you need help. A heart-healthy diet involves: Limiting unhealthy fats and increasing healthy fats. Some examples of healthy fats are avocados and olive oil. Eating plant-based foods, such as fruits, vegetables, nuts, whole grains, and legumes (such as peas and lentils). If you drink alcohol: Limit how much you have to: 0-1 drink a day for women who are not pregnant. 0-2 drinks a day for men. Know how much alcohol is in a drink. In the [place] one drink equals one 12 oz bottle of beer (355 mL), one 5 oz glass of wine (148 mL), or one 1 oz glass of hard liquor (44 mL). Lifestyle  Maintain a healthy weight. Lose weight if your health care provider says that you need to do  that. Follow an exercise program as told by your health care provider. Do not use any products that contain nicotine or tobacco. These products include cigarettes, chewing tobacco, and vaping devices, such as e-cigarettes. If you need help quitting, ask your health care provider. Do not use drugs. General instructions Take over-the-counter and prescription medicines only as told by your health care provider. Manage other health conditions as told. Keep all follow-up visits. This is important. Contact a health care provider if you have: An irregular heartbeat. Unexplained tiredness (fatigue). Trouble urinating, or you are producing less urine or foamy urine. Swelling of your hands or feet, or itchy skin. Unexplained pain or numbness in your legs or hips. A wound that is slow to heal or is not healing. Get help right away if: You have any symptoms of a heart attack. These may be: Chest pain. This includes squeezing chest pain that may feel like indigestion (angina). Shortness of breath. Pain in your neck, jaw, arms, back, or stomach. Cold sweat. Nausea. Light-headedness. Sudden pain, numbness, or coldness in a limb. You have any symptoms of a stroke. "BE FAST" is an easy way to remember the main warning signs of a stroke: B - Balance. Signs are dizziness, sudden trouble walking, or loss of balance. E - Eyes. Signs are trouble seeing or a sudden change in vision. F - Face. Signs are sudden weakness or numbness of the face, or the face or eyelid drooping on one side. A - Arms. Signs are weakness or numbness in an arm. This happens suddenly and usually on one side of the body. S -  Speech. Signs are sudden trouble speaking, slurred speech, or trouble understanding what people say. T - Time. Time to call emergency services. Write down what time symptoms started. You have other signs of a stroke, such as: A sudden, severe headache with no known cause. Nausea or vomiting. Seizure. These  symptoms may represent a serious problem that is an emergency. Do not wait to see if the symptoms will go away. Get medical help right away. Call your local emergency services (911 in the [place] Do not drive yourself to the hospital. Summary Atherosclerosis is when plaque builds up in the arteries and causes narrowing and hardening of the arteries. Plaque occurs due to inflammation or from a buildup of fat, cholesterol, calcium, cellular waste products, and fibrin. This condition may not cause any symptoms. Symptoms of atherosclerosis do not occur until the plaque severely narrows or blocks the artery. Treatment starts with lifestyle changes and may include medicines. In some cases, surgery is needed. Get help right away if you have any symptoms of a heart attack or stroke. This information is not intended to replace advice given to you by your health care provider. Make sure you discuss any questions you have with your health care provider. Document Revised: 04/04/2020 Document Reviewed: 04/04/2020 Elsevier Patient Education  2024 [employer].

## 2022-11-27 NOTE — Progress Notes (Signed)
Subjective:    [name], male    DOB: Jul 30, 1928, 89 y.o.   MRN: [id number]  Chief Complaint  Patient presents with   Medical Management of Chronic Issues   PT presents to the office today for chronic follow up.    He is followed by  Urologists for renal lesion. He is to have repeat MRI in 3 months for surveillance.     He reports he cut his nose on a bush three months ago. It is not healing and continues to bleed.    He saw the dermatologists for basal cell cancer on his nose and chest. Had lesion removed  on his chest on 11/24/22.  Has thoracic aortic atherosclerosis but does not take statin.  Hypertension This is a chronic problem. The current episode started more than 1 year ago. The problem has been waxing and waning since onset. The problem is uncontrolled. Associated symptoms include anxiety. Pertinent negatives include no malaise/fatigue, palpitations, peripheral edema or shortness of breath. Risk factors for coronary artery disease include dyslipidemia and male gender. The current treatment provides moderate improvement.  Hyperlipidemia This is a chronic problem. The current episode started more than 1 year ago. The problem is uncontrolled. Pertinent negatives include no shortness of breath. Current antihyperlipidemic treatment includes diet change. The current treatment provides no improvement of lipids. Risk factors for coronary artery disease include dyslipidemia, hypertension and a sedentary lifestyle.  Anxiety Presents for follow-up visit. Symptoms include excessive worry and nervous/anxious behavior. Patient reports no decreased concentration, irritability, palpitations, panic or shortness of breath. Symptoms occur rarely. The severity of symptoms is mild.    Back Pain This is a chronic problem. The current episode started more than 1 year ago. The problem occurs intermittently. The problem has been waxing and waning since onset. The pain is present  in the lumbar spine. The quality of the pain is described as aching. The pain is mild.      Review of Systems  Constitutional:  Negative for irritability and malaise/fatigue.  Respiratory:  Negative for shortness of breath.   Cardiovascular:  Negative for palpitations.  Musculoskeletal:  Positive for back pain.  Psychiatric/Behavioral:  Negative for decreased concentration. The patient is nervous/anxious.   All other systems reviewed and are negative.      Objective:   Physical Exam Vitals reviewed.  Constitutional:      General: He is not in acute distress.    Appearance: He is well-developed.  HENT:     Head: Normocephalic.     Right Ear: Tympanic membrane normal.     Left Ear: Tympanic membrane normal.  Eyes:     General:        Right eye: No discharge.        Left eye: No discharge.     Pupils: Pupils are equal, round, and reactive to light.  Neck:     Thyroid: No thyromegaly.  Cardiovascular:     Rate and Rhythm: Normal rate and regular rhythm.     Heart sounds: Normal heart sounds. No murmur heard. Pulmonary:     Effort: Pulmonary effort is normal. No respiratory distress.     Breath sounds: Normal breath sounds. No wheezing.  Abdominal:     General: Bowel sounds are normal. There is no distension.     Palpations: Abdomen is soft.     Tenderness: There is no abdominal tenderness.  Musculoskeletal:        General: No tenderness. Normal range of  motion.     Cervical back: Normal range of motion and neck supple.  Skin:    General: Skin is warm and dry.     Findings: No erythema or rash.          Comments: Lesion on chest, no erythemas or discharge, healing well  Neurological:     Mental Status: He is alert and oriented to person, place, and time.     Cranial Nerves: No cranial nerve deficit.     Deep Tendon Reflexes: Reflexes are normal and symmetric.  Psychiatric:        Behavior: Behavior normal.        Thought Content: Thought content normal.         Judgment: Judgment normal.       BP (!) 147/74   Pulse 92   Temp (!) 97.5 F (36.4 C) (Temporal)   Ht 5\' 10"  (1.778 m)   Wt 126 lb 12.8 oz (57.5 kg)   SpO2 97%   BMI 18.19 kg/m      Assessment & Plan:   Benjamin Buchanan comes in today with chief complaint of Medical Management of Chronic Issues   Diagnosis and orders addressed:  1. Essential hypertension - CMP14+EGFR - CBC with Differential/Platelet  2. Mixed hyperlipidemia - CMP14+EGFR - CBC with Differential/Platelet  3. Thoracic aortic atherosclerosis (HCC) Start Crestor 5 mg  - CMP14+EGFR - CBC with Differential/Platelet - rosuvastatin (CRESTOR) 5 MG tablet; Take 1 tablet (5 mg total) by mouth daily.  Dispense: 90 tablet; Refill: 3  4. Spinal stenosis of lumbar region at multiple levels - CMP14+EGFR - CBC with Differential/Platelet  5. Stage 3a chronic kidney disease (HCC) - CMP14+EGFR - CBC with Differential/Platelet  6. Skin cancer, basal cell - CMP14+EGFR - CBC with Differential/Platelet  Start Crestor 5 mg  Labs pending Health Maintenance reviewed Diet and exercise encouraged  Follow up plan: 3 months    Benjamin Rodney, FNP

## 2022-11-28 ENCOUNTER — Other Ambulatory Visit: Payer: Self-pay | Admitting: Family Medicine

## 2022-11-28 ENCOUNTER — Other Ambulatory Visit: Payer: Medicare HMO

## 2022-11-28 DIAGNOSIS — D649 Anemia, unspecified: Secondary | ICD-10-CM

## 2022-11-29 LAB — FECAL OCCULT BLOOD, IMMUNOCHEMICAL: Fecal Occult Bld: NEGATIVE

## 2022-11-30 ENCOUNTER — Other Ambulatory Visit: Payer: Self-pay | Admitting: Family

## 2022-12-14 ENCOUNTER — Other Ambulatory Visit: Payer: Self-pay | Admitting: Family

## 2022-12-14 DIAGNOSIS — I10 Essential (primary) hypertension: Secondary | ICD-10-CM

## 2022-12-15 ENCOUNTER — Telehealth: Payer: Self-pay

## 2022-12-15 NOTE — Telephone Encounter (Signed)
Transition Care Management Follow-up Telephone Call Date of discharge and from where: 11/25/2022 [facility] How have you been since you were released from the hospital? Patient stated he is feeling great. Any questions or concerns? No  Items Reviewed: Did the pt receive and understand the discharge instructions provided? Yes  Medications obtained and verified?  No medication prescribed. Other? No  Any new allergies since your discharge? No  Dietary orders reviewed? Yes Do you have support at home? Yes   Follow up appointments reviewed:  PCP Hospital f/u appt confirmed? Yes  Scheduled to see Christy A. Lendon Colonel, FNP on 11/27/2022 @ [facility]. Specialist Hospital f/u appt confirmed? No  Scheduled to see  on  @ . Are transportation arrangements needed? No  If their condition worsens, is the pt aware to call PCP or go to the Emergency Dept.? Yes Was the patient provided with contact information for the PCP's office or ED? Yes Was to pt encouraged to call back with questions or concerns? Yes   Nysha Koplin [facility]  [facility], [facility] Guide Direct Dial: [phone]  Website: [email or web address]

## 2022-12-18 ENCOUNTER — Ambulatory Visit (HOSPITAL_COMMUNITY): Payer: Medicare HMO

## 2023-01-12 DIAGNOSIS — Z85828 Personal history of other malignant neoplasm of skin: Secondary | ICD-10-CM | POA: Diagnosis not present

## 2023-01-12 DIAGNOSIS — Z08 Encounter for follow-up examination after completed treatment for malignant neoplasm: Secondary | ICD-10-CM | POA: Diagnosis not present

## 2023-01-16 ENCOUNTER — Ambulatory Visit (HOSPITAL_COMMUNITY)
Admission: RE | Admit: 2023-01-16 | Discharge: 2023-01-16 | Disposition: A | Discharge status: Home / Self Care | Payer: Medicare HMO | Source: Ambulatory Visit | Attending: Urology | Admitting: Urology

## 2023-01-16 DIAGNOSIS — N2889 Other specified disorders of kidney and ureter: Secondary | ICD-10-CM | POA: Diagnosis not present

## 2023-01-16 DIAGNOSIS — Q444 Choledochal cyst: Secondary | ICD-10-CM | POA: Diagnosis not present

## 2023-01-16 DIAGNOSIS — I700 Atherosclerosis of aorta: Secondary | ICD-10-CM | POA: Diagnosis not present

## 2023-01-16 MED ORDER — GADOBUTROL 1 MMOL/ML IV SOLN
5.5000 mL | Freq: Once | INTRAVENOUS | Status: AC | PRN
  Administered 2023-01-16: 5.5 mL via INTRAVENOUS

## 2023-01-26 ENCOUNTER — Telehealth: Payer: Self-pay

## 2023-01-26 DIAGNOSIS — N281 Cyst of kidney, acquired: Secondary | ICD-10-CM

## 2023-01-26 DIAGNOSIS — N289 Disorder of kidney and ureter, unspecified: Secondary | ICD-10-CM

## 2023-01-26 DIAGNOSIS — N2889 Other specified disorders of kidney and ureter: Secondary | ICD-10-CM

## 2023-01-26 NOTE — Telephone Encounter (Signed)
-----   Message from Norleen Seltzer sent at 01/26/2023  8:17 AM EST ----- The MRI shows stability of the renal masses.    He needs f/u in about 9 months with another MRI of the abd w/wo contrast for follow up of the renal masses and will need an appointment with the results. ----- Message ----- From: Sammie Exie HERO, CMA Sent: 01/26/2023   8:03 AM EST To: Norleen Seltzer, MD  Please review.

## 2023-01-27 NOTE — Telephone Encounter (Addendum)
 Called Pt daughter and relayed message from MD also let Pt know we'll be scheduling a appt for 9 months out and she said she'd look on my chart for the exact date

## 2023-02-03 ENCOUNTER — Other Ambulatory Visit: Payer: Self-pay | Admitting: Family

## 2023-02-09 DIAGNOSIS — Z08 Encounter for follow-up examination after completed treatment for malignant neoplasm: Secondary | ICD-10-CM | POA: Diagnosis not present

## 2023-02-09 DIAGNOSIS — Z85828 Personal history of other malignant neoplasm of skin: Secondary | ICD-10-CM | POA: Diagnosis not present

## 2023-02-11 DIAGNOSIS — Z008 Encounter for other general examination: Secondary | ICD-10-CM | POA: Diagnosis not present

## 2023-03-27 ENCOUNTER — Encounter: Payer: Self-pay | Admitting: Family

## 2023-03-27 ENCOUNTER — Ambulatory Visit: Payer: Medicare HMO | Admitting: Family

## 2023-03-27 VITALS — BP 174/72 | HR 72 | Temp 97.1°F | Ht 70.0 in | Wt 129.2 lb

## 2023-03-27 DIAGNOSIS — M5137 Other intervertebral disc degeneration, lumbosacral region with discogenic back pain only: Secondary | ICD-10-CM

## 2023-03-27 DIAGNOSIS — C641 Malignant neoplasm of right kidney, except renal pelvis: Secondary | ICD-10-CM

## 2023-03-27 DIAGNOSIS — E782 Mixed hyperlipidemia: Secondary | ICD-10-CM

## 2023-03-27 DIAGNOSIS — I10 Essential (primary) hypertension: Secondary | ICD-10-CM

## 2023-03-27 DIAGNOSIS — I700 Atherosclerosis of aorta: Secondary | ICD-10-CM | POA: Diagnosis not present

## 2023-03-27 MED ORDER — ONETOUCH ULTRA VI STRP: ORAL_STRIP | 3 refills | Status: DC

## 2023-03-27 MED ORDER — BENAZEPRIL HCL 20 MG PO TABS: 20.0000 mg | ORAL_TABLET | Freq: Every day | ORAL | 0 refills | Status: DC

## 2023-03-27 NOTE — Patient Instructions (Signed)
 Hypertension, Adult High blood pressure (hypertension) is when the force of blood pumping through the arteries is too strong. The arteries are the blood vessels that carry blood from the heart throughout the body. Hypertension forces the heart to work harder to pump blood and may cause arteries to become narrow or stiff. Untreated or uncontrolled hypertension can lead to a heart attack, heart failure, a stroke, kidney disease, and other problems. A blood pressure reading consists of a higher number over a lower number. Ideally, your blood pressure should be below 120/80. The first ("top") number is called the systolic pressure. It is a measure of the pressure in your arteries as your heart beats. The second ("bottom") number is called the diastolic pressure. It is a measure of the pressure in your arteries as the heart relaxes. What are the causes? The exact cause of this condition is not known. There are some conditions that result in high blood pressure. What increases the risk? Certain factors may make you more likely to develop high blood pressure. Some of these risk factors are under your control, including: Smoking. Not getting enough exercise or physical activity. Being overweight. Having too much fat, sugar, calories, or salt (sodium) in your diet. Drinking too much alcohol. Other risk factors include: Having a personal history of heart disease, diabetes, high cholesterol, or kidney disease. Stress. Having a family history of high blood pressure and high cholesterol. Having obstructive sleep apnea. Age. The risk increases with age. What are the signs or symptoms? High blood pressure may not cause symptoms. Very high blood pressure (hypertensive crisis) may cause: Headache. Fast or irregular heartbeats (palpitations). Shortness of breath. Nosebleed. Nausea and vomiting. Vision changes. Severe chest pain, dizziness, and seizures. How is this diagnosed? This condition is diagnosed by  measuring your blood pressure while you are seated, with your arm resting on a flat surface, your legs uncrossed, and your feet flat on the floor. The cuff of the blood pressure monitor will be placed directly against the skin of your upper arm at the level of your heart. Blood pressure should be measured at least twice using the same arm. Certain conditions can cause a difference in blood pressure between your right and left arms. If you have a high blood pressure reading during one visit or you have normal blood pressure with other risk factors, you may be asked to: Return on a different day to have your blood pressure checked again. Monitor your blood pressure at home for 1 week or longer. If you are diagnosed with hypertension, you may have other blood or imaging tests to help your health care provider understand your overall risk for other conditions. How is this treated? This condition is treated by making healthy lifestyle changes, such as eating healthy foods, exercising more, and reducing your alcohol intake. You may be referred for counseling on a healthy diet and physical activity. Your health care provider may prescribe medicine if lifestyle changes are not enough to get your blood pressure under control and if: Your systolic blood pressure is above 130. Your diastolic blood pressure is above 80. Your personal target blood pressure may vary depending on your medical conditions, your age, and other factors. Follow these instructions at home: Eating and drinking  Eat a diet that is high in fiber and potassium, and low in sodium, added sugar, and fat. An example of this eating plan is called the DASH diet. DASH stands for Dietary Approaches to Stop Hypertension. To eat this way: Eat  plenty of fresh fruits and vegetables. Try to fill one half of your plate at each meal with fruits and vegetables. Eat whole grains, such as whole-wheat pasta, brown rice, or whole-grain bread. Fill about one  fourth of your plate with whole grains. Eat or drink low-fat dairy products, such as skim milk or low-fat yogurt. Avoid fatty cuts of meat, processed or cured meats, and poultry with skin. Fill about one fourth of your plate with lean proteins, such as fish, chicken without skin, beans, eggs, or tofu. Avoid pre-made and processed foods. These tend to be higher in sodium, added sugar, and fat. Reduce your daily sodium intake. Many people with hypertension should eat less than 1,500 mg of sodium a day. Do not drink alcohol if: Your health care provider tells you not to drink. You are pregnant, may be pregnant, or are planning to become pregnant. If you drink alcohol: Limit how much you have to: 0-1 drink a day for women. 0-2 drinks a day for men. Know how much alcohol is in your drink. In the [place] one drink equals one 12 oz bottle of beer (355 mL), one 5 oz glass of wine (148 mL), or one 1 oz glass of hard liquor (44 mL). Lifestyle  Work with your health care provider to maintain a healthy body weight or to lose weight. Ask what an ideal weight is for you. Get at least 30 minutes of exercise that causes your heart to beat faster (aerobic exercise) most days of the week. Activities may include walking, swimming, or biking. Include exercise to strengthen your muscles (resistance exercise), such as Pilates or lifting weights, as part of your weekly exercise routine. Try to do these types of exercises for 30 minutes at least 3 days a week. Do not use any products that contain nicotine or tobacco. These products include cigarettes, chewing tobacco, and vaping devices, such as e-cigarettes. If you need help quitting, ask your health care provider. Monitor your blood pressure at home as told by your health care provider. Keep all follow-up visits. This is important. Medicines Take over-the-counter and prescription medicines only as told by your health care provider. Follow directions carefully. Blood  pressure medicines must be taken as prescribed. Do not skip doses of blood pressure medicine. Doing this puts you at risk for problems and can make the medicine less effective. Ask your health care provider about side effects or reactions to medicines that you should watch for. Contact a health care provider if you: Think you are having a reaction to a medicine you are taking. Have headaches that keep coming back (recurring). Feel dizzy. Have swelling in your ankles. Have trouble with your vision. Get help right away if you: Develop a severe headache or confusion. Have unusual weakness or numbness. Feel faint. Have severe pain in your chest or abdomen. Vomit repeatedly. Have trouble breathing. These symptoms may be an emergency. Get help right away. Call 911. Do not wait to see if the symptoms will go away. Do not drive yourself to the hospital. Summary Hypertension is when the force of blood pumping through your arteries is too strong. If this condition is not controlled, it may put you at risk for serious complications. Your personal target blood pressure may vary depending on your medical conditions, your age, and other factors. For most people, a normal blood pressure is less than 120/80. Hypertension is treated with lifestyle changes, medicines, or a combination of both. Lifestyle changes include losing weight, eating a healthy,  low-sodium diet, exercising more, and limiting alcohol. This information is not intended to replace advice given to you by your health care provider. Make sure you discuss any questions you have with your health care provider. Document Revised: 11/06/2020 Document Reviewed: 11/06/2020 Elsevier Patient Education  2024 [employer].

## 2023-03-27 NOTE — Progress Notes (Signed)
 Subjective:    [name], male    DOB: [birth year], 89 y.o.   MRN: [id number]  Chief Complaint  Patient presents with   Medical Management of Chronic Issues   PT presents to the office today for chronic follow up. His BP is elevated today, but has not taken his medications this morning.    He is followed by Urologists for renal lesion. His MRI on 01/16/23 showed, "1. Unchanged small enhancing masses arising from the anterior inferior pole of the right kidney measuring 1.3 x 1.0 cm, and the posterior inferior pole of the right kidney measuring 1.2 x 0.8 cm. These are consistent with small renal cell carcinomas, generally of doubtful clinical significance given small size and very advanced patient age. 2. No evidence of renal vein invasion, lymphadenopathy, or metastatic disease in the abdomen. 3. Unchanged aneurysmal dilatation of the portal vein measuring 5.5 x 3.8 cm. 4. Large burden of stool in the colon."  Has repeat MRI pending for 10/16/23.     He saw the dermatologists for basal cell cancer on his nose and chest.   Has thoracic aortic atherosclerosis and taking Crestor 5 mg daily.   Hypertension This is a chronic problem. The current episode started more than 1 year ago. The problem has been waxing and waning since onset. The problem is uncontrolled. Associated symptoms include anxiety and malaise/fatigue. Pertinent negatives include no palpitations, peripheral edema or shortness of breath. Risk factors for coronary artery disease include dyslipidemia and male gender. The current treatment provides moderate improvement.  Hyperlipidemia This is a chronic problem. The current episode started more than 1 year ago. The problem is controlled. Pertinent negatives include no shortness of breath. Current antihyperlipidemic treatment includes diet change and statins. The current treatment provides mild improvement of lipids. Risk factors for coronary artery disease  include dyslipidemia, hypertension and a sedentary lifestyle.  Anxiety Presents for follow-up visit. Symptoms include excessive worry and nervous/anxious behavior. Patient reports no decreased concentration, irritability, palpitations, panic or shortness of breath. Symptoms occur rarely. The severity of symptoms is mild.    Back Pain This is a chronic problem. The current episode started more than 1 year ago. The problem occurs intermittently. The problem has been waxing and waning since onset. The pain is present in the lumbar spine. The quality of the pain is described as aching. The pain is at a severity of 10/10 (in the mornings then after walking around improves to a 3). The pain is mild. He has tried bed rest and home exercises for the symptoms. The treatment provided mild relief.      Review of Systems  Constitutional:  Positive for malaise/fatigue. Negative for irritability.  Respiratory:  Negative for shortness of breath.   Cardiovascular:  Negative for palpitations.  Musculoskeletal:  Positive for back pain.  Psychiatric/Behavioral:  Negative for decreased concentration. The patient is nervous/anxious.   All other systems reviewed and are negative.      Objective:   Physical Exam Vitals reviewed.  Constitutional:      General: He is not in acute distress.    Appearance: He is well-developed.  HENT:     Head: Normocephalic.     Right Ear: Tympanic membrane normal.     Left Ear: Tympanic membrane normal.  Eyes:     General:        Right eye: No discharge.        Left eye: No discharge.     Pupils: Pupils are  equal, round, and reactive to light.  Neck:     Thyroid: No thyromegaly.  Cardiovascular:     Rate and Rhythm: Normal rate and regular rhythm.     Heart sounds: Normal heart sounds. No murmur heard. Pulmonary:     Effort: Pulmonary effort is normal. No respiratory distress.     Breath sounds: Normal breath sounds. No wheezing.  Abdominal:     General: Bowel  sounds are normal. There is no distension.     Palpations: Abdomen is soft.     Tenderness: There is no abdominal tenderness.  Musculoskeletal:        General: No tenderness. Normal range of motion.     Cervical back: Normal range of motion and neck supple.  Skin:    General: Skin is warm and dry.     Findings: No erythema or rash.  Neurological:     Mental Status: He is alert and oriented to person, place, and time.     Cranial Nerves: No cranial nerve deficit.     Deep Tendon Reflexes: Reflexes are normal and symmetric.  Psychiatric:        Behavior: Behavior normal.        Thought Content: Thought content normal.        Judgment: Judgment normal.       BP (!) 172/77   Pulse 72   Temp (!) 97.1 F (36.2 C) (Temporal)   Ht 5\' 10"  (1.778 m)   Wt 129 lb 3.2 oz (58.6 kg)   SpO2 98%   BMI 18.54 kg/m      Assessment & Plan:   Benjamin Buchanan comes in today with chief complaint of Medical Management of Chronic Issues   Diagnosis and orders addressed:  1. Essential hypertension (Primary) - benazepril (LOTENSIN) 20 MG tablet; Take 1 tablet (20 mg total) by mouth daily.  Dispense: 90 tablet; Refill: 0 - CMP14+EGFR - CBC with Differential/Platelet  2. Mixed hyperlipidemia - CMP14+EGFR - CBC with Differential/Platelet  3. Thoracic aortic atherosclerosis (HCC) - CMP14+EGFR - CBC with Differential/Platelet  4. Degeneration of intervertebral disc of lumbosacral region with discogenic back pain - CMP14+EGFR - CBC with Differential/Platelet  5. Renal cell carcinoma of right kidney (HCC) - CMP14+EGFR - CBC with Differential/Platelet  PT has not taken any of BP medications this morning. Will hold off on adjusting at this time.  Labs pending Health Maintenance reviewed Diet and exercise encouraged  Follow up plan: 3 months    [name], FNP

## 2023-03-28 LAB — CBC WITH DIFFERENTIAL/PLATELET
Basophils Absolute: 0 10*3/uL (ref 0.0–0.2)
Basos: 1 %
EOS (ABSOLUTE): 0.2 10*3/uL (ref 0.0–0.4)
Eos: 2 %
Hematocrit: 37.2 % — ABNORMAL LOW (ref 37.5–51.0)
Hemoglobin: 12.1 g/dL — ABNORMAL LOW (ref 13.0–17.7)
Immature Grans (Abs): 0 10*3/uL (ref 0.0–0.1)
Immature Granulocytes: 0 %
Lymphocytes Absolute: 1.3 10*3/uL (ref 0.7–3.1)
Lymphs: 17 %
MCH: 30.8 pg (ref 26.6–33.0)
MCHC: 32.5 g/dL (ref 31.5–35.7)
MCV: 95 fL (ref 79–97)
Monocytes Absolute: 0.5 10*3/uL (ref 0.1–0.9)
Monocytes: 7 %
Neutrophils Absolute: 5.6 10*3/uL (ref 1.4–7.0)
Neutrophils: 73 %
Platelets: 329 10*3/uL (ref 150–450)
RBC: 3.93 x10E6/uL — ABNORMAL LOW (ref 4.14–5.80)
RDW: 12.7 % (ref 11.6–15.4)
WBC: 7.7 10*3/uL (ref 3.4–10.8)

## 2023-03-28 LAB — CMP14+EGFR
ALT: 17 IU/L (ref 0–44)
AST: 24 IU/L (ref 0–40)
Albumin: 4.1 g/dL (ref 3.6–4.6)
Alkaline Phosphatase: 81 IU/L (ref 44–121)
BUN/Creatinine Ratio: 14 (ref 10–24)
BUN: 16 mg/dL (ref 10–36)
Bilirubin Total: 0.4 mg/dL (ref 0.0–1.2)
CO2: 21 mmol/L (ref 20–29)
Calcium: 9.5 mg/dL (ref 8.6–10.2)
Chloride: 106 mmol/L (ref 96–106)
Creatinine, Ser: 1.11 mg/dL (ref 0.76–1.27)
Globulin, Total: 2.2 g/dL (ref 1.5–4.5)
Glucose: 101 mg/dL — ABNORMAL HIGH (ref 70–99)
Potassium: 4.2 mmol/L (ref 3.5–5.2)
Sodium: 143 mmol/L (ref 134–144)
Total Protein: 6.3 g/dL (ref 6.0–8.5)
eGFR: 62 mL/min/{1.73_m2} (ref >59)

## 2023-06-29 ENCOUNTER — Encounter: Payer: Self-pay | Admitting: Family

## 2023-06-29 ENCOUNTER — Ambulatory Visit (INDEPENDENT_AMBULATORY_CARE_PROVIDER_SITE_OTHER): Admitting: Family

## 2023-06-29 VITALS — BP 137/63 | HR 78 | Temp 98.4°F | Ht 70.0 in | Wt 134.0 lb

## 2023-06-29 DIAGNOSIS — C641 Malignant neoplasm of right kidney, except renal pelvis: Secondary | ICD-10-CM | POA: Diagnosis not present

## 2023-06-29 DIAGNOSIS — Z0001 Encounter for general adult medical examination with abnormal findings: Secondary | ICD-10-CM | POA: Diagnosis not present

## 2023-06-29 DIAGNOSIS — Z0000 Encounter for general adult medical examination without abnormal findings: Secondary | ICD-10-CM | POA: Diagnosis not present

## 2023-06-29 DIAGNOSIS — I700 Atherosclerosis of aorta: Secondary | ICD-10-CM | POA: Diagnosis not present

## 2023-06-29 DIAGNOSIS — I10 Essential (primary) hypertension: Secondary | ICD-10-CM | POA: Diagnosis not present

## 2023-06-29 DIAGNOSIS — E782 Mixed hyperlipidemia: Secondary | ICD-10-CM

## 2023-06-29 DIAGNOSIS — N1831 Chronic kidney disease, stage 3a: Secondary | ICD-10-CM | POA: Diagnosis not present

## 2023-06-29 DIAGNOSIS — M48061 Spinal stenosis, lumbar region without neurogenic claudication: Secondary | ICD-10-CM | POA: Diagnosis not present

## 2023-06-29 DIAGNOSIS — E559 Vitamin D deficiency, unspecified: Secondary | ICD-10-CM

## 2023-06-29 DIAGNOSIS — D649 Anemia, unspecified: Secondary | ICD-10-CM | POA: Diagnosis not present

## 2023-06-29 NOTE — Patient Instructions (Signed)
 Health Maintenance After Age 88 After age 4, you are at a higher risk for certain long-term diseases and infections as well as injuries from falls. Falls are a major cause of broken bones and head injuries in people who are older than age 47. Getting regular preventive care can help to keep you healthy and well. Preventive care includes getting regular testing and making lifestyle changes as recommended by your health care provider. Talk with your health care provider about: Which screenings and tests you should have. A screening is a test that checks for a disease when you have no symptoms. A diet and exercise plan that is right for you. What should I know about screenings and tests to prevent falls? Screening and testing are the best ways to find a health problem early. Early diagnosis and treatment give you the best chance of managing medical conditions that are common after age 37. Certain conditions and lifestyle choices may make you more likely to have a fall. Your health care provider may recommend: Regular vision checks. Poor vision and conditions such as cataracts can make you more likely to have a fall. If you wear glasses, make sure to get your prescription updated if your vision changes. Medicine review. Work with your health care provider to regularly review all of the medicines you are taking, including over-the-counter medicines. Ask your health care provider about any side effects that may make you more likely to have a fall. Tell your health care provider if any medicines that you take make you feel dizzy or sleepy. Strength and balance checks. Your health care provider may recommend certain tests to check your strength and balance while standing, walking, or changing positions. Foot health exam. Foot pain and numbness, as well as not wearing proper footwear, can make you more likely to have a fall. Screenings, including: Osteoporosis screening. Osteoporosis is a condition that causes  the bones to get weaker and break more easily. Blood pressure screening. Blood pressure changes and medicines to control blood pressure can make you feel dizzy. Depression screening. You may be more likely to have a fall if you have a fear of falling, feel depressed, or feel unable to do activities that you used to do. Alcohol use screening. Using too much alcohol can affect your balance and may make you more likely to have a fall. Follow these instructions at home: Lifestyle Do not drink alcohol if: Your health care provider tells you not to drink. If you drink alcohol: Limit how much you have to: 0-1 drink a day for women. 0-2 drinks a day for men. Know how much alcohol is in your drink. In the [place] one drink equals one 12 oz bottle of beer (355 mL), one 5 oz glass of wine (148 mL), or one 1 oz glass of hard liquor (44 mL). Do not use any products that contain nicotine or tobacco. These products include cigarettes, chewing tobacco, and vaping devices, such as e-cigarettes. If you need help quitting, ask your health care provider. Activity  Follow a regular exercise program to stay fit. This will help you maintain your balance. Ask your health care provider what types of exercise are appropriate for you. If you need a cane or walker, use it as recommended by your health care provider. Wear supportive shoes that have nonskid soles. Safety  Remove any tripping hazards, such as rugs, cords, and clutter. Install safety equipment such as grab bars in bathrooms and safety rails on stairs. Keep rooms and walkways  well-lit. General instructions Talk with your health care provider about your risks for falling. Tell your health care provider if: You fall. Be sure to tell your health care provider about all falls, even ones that seem minor. You feel dizzy, tiredness (fatigue), or off-balance. Take over-the-counter and prescription medicines only as told by your health care provider. These include  supplements. Eat a healthy diet and maintain a healthy weight. A healthy diet includes low-fat dairy products, low-fat (lean) meats, and fiber from whole grains, beans, and lots of fruits and vegetables. Stay current with your vaccines. Schedule regular health, dental, and eye exams. Summary Having a healthy lifestyle and getting preventive care can help to protect your health and wellness after age 11. Screening and testing are the best way to find a health problem early and help you avoid having a fall. Early diagnosis and treatment give you the best chance for managing medical conditions that are more common for people who are older than age 28. Falls are a major cause of broken bones and head injuries in people who are older than age 48. Take precautions to prevent a fall at home. Work with your health care provider to learn what changes you can make to improve your health and wellness and to prevent falls. This information is not intended to replace advice given to you by your health care provider. Make sure you discuss any questions you have with your health care provider. Document Revised: 05/21/2020 Document Reviewed: 05/21/2020 Elsevier Patient Education  2024 [employer].

## 2023-06-29 NOTE — Progress Notes (Addendum)
 Subjective:    [name], male    DOB: August 05, 1928, 89 y.o.   MRN: [id number]  Chief Complaint  Patient presents with   Medical Management of Chronic Issues   PT presents to the office today for CPE and chronic follow up.    He is followed by Urologists for renal lesion. His MRI on 01/16/23 showed, 1. Unchanged small enhancing masses arising from the anterior inferior pole of the right kidney measuring 1.3 x 1.0 cm, and the posterior inferior pole of the right kidney measuring 1.2 x 0.8 cm. These are consistent with small renal cell carcinomas, generally of doubtful clinical significance given small size and very advanced patient age. 2. No evidence of renal vein invasion, lymphadenopathy, or metastatic disease in the abdomen. 3. Unchanged aneurysmal dilatation of the portal vein measuring 5.5 x 3.8 cm. 4. Large burden of stool in the colon.  Has repeat MRI pending for 10/16/23.     He saw the dermatologists for basal cell cancer on his nose and chest.   Has thoracic aortic atherosclerosis and taking Crestor  5 mg daily.    Has CKD and avoids NSAIDs.  Hypertension This is a chronic problem. The current episode started more than 1 year ago. The problem has been waxing and waning since onset. The problem is uncontrolled. Associated symptoms include anxiety and malaise/fatigue. Pertinent negatives include no palpitations, peripheral edema or shortness of breath. Risk factors for coronary artery disease include dyslipidemia and male gender. The current treatment provides moderate improvement.  Hyperlipidemia This is a chronic problem. The current episode started more than 1 year ago. The problem is controlled. Recent lipid tests were reviewed and are normal. Pertinent negatives include no shortness of breath. Current antihyperlipidemic treatment includes diet change and statins. The current treatment provides mild improvement of lipids. Risk factors for coronary  artery disease include dyslipidemia, hypertension and a sedentary lifestyle.  Anxiety Presents for follow-up visit. Symptoms include excessive worry and nervous/anxious behavior. Patient reports no decreased concentration, irritability, palpitations, panic or shortness of breath. Symptoms occur rarely. The severity of symptoms is mild.    Back Pain This is a chronic problem. The current episode started more than 1 year ago. The problem occurs intermittently. The problem has been waxing and waning since onset. The pain is present in the lumbar spine. The quality of the pain is described as aching. The pain is at a severity of 10/10 (in the mornings then after walking around improves to a 3). The pain is mild. He has tried bed rest and home exercises for the symptoms. The treatment provided mild relief.      Review of Systems  Constitutional:  Positive for malaise/fatigue. Negative for irritability.  Respiratory:  Negative for shortness of breath.   Cardiovascular:  Negative for palpitations.  Musculoskeletal:  Positive for back pain.  Psychiatric/Behavioral:  Negative for decreased concentration. The patient is nervous/anxious.   All other systems reviewed and are negative.  Family History  Problem Relation Age of Onset   Cancer Father        prostate cancer   Hypertension Other        family history   Social History   Socioeconomic History   Marital status: Widowed    Spouse name: Not on file   Number of children: 2   Years of education: 10   Highest education level: 10th grade  Occupational History   Occupation: retired  Tobacco Use   Smoking status: Never  Smokeless tobacco: Never  Vaping Use   Vaping status: Never Used  Substance and Sexual Activity   Alcohol use: No   Drug use: No   Sexual activity: Not Currently  Other Topics Concern   Not on file  Social History Narrative   Retired. Lives alone. No family nearby, but he eats out for breakfast and dinner with  friends several times per week. Stays active. Still very independent 11/2020   Social Drivers of Health   Financial Resource Strain: Low Risk  (01/27/2022)   Overall Financial Resource Strain (CARDIA)    Difficulty of Paying Living Expenses: Not hard at all  Food Insecurity: No Food Insecurity (07/21/2022)   Hunger Vital Sign    Worried About Running Out of Food in the Last Year: Never true    Ran Out of Food in the Last Year: Never true  Transportation Needs: No Transportation Needs (07/21/2022)   PRAPARE - Administrator, Civil Service (Medical): No    Lack of Transportation (Non-Medical): No  Physical Activity: Inactive (01/27/2022)   Exercise Vital Sign    Days of Exercise per Week: 0 days    Minutes of Exercise per Session: 0 min  Stress: No Stress Concern Present (01/27/2022)   [employer] of Occupational Health - Occupational Stress Questionnaire    Feeling of Stress : Not at all  Social Connections: Moderately Isolated (01/27/2022)   Social Connection and Isolation Panel    Frequency of Communication with Friends and Family: More than three times a week    Frequency of Social Gatherings with Friends and Family: Three times a week    Attends Religious Services: Never    Active Member of Clubs or Organizations: Yes    Attends Banker Meetings: More than 4 times per year    Marital Status: Widowed        Objective:   Physical Exam Vitals reviewed.  Constitutional:      General: He is not in acute distress.    Appearance: He is well-developed.  HENT:     Head: Normocephalic.     Right Ear: Tympanic membrane normal.     Left Ear: Tympanic membrane normal.   Eyes:     General:        Right eye: No discharge.        Left eye: No discharge.     Pupils: Pupils are equal, round, and reactive to light.   Neck:     Thyroid : No thyromegaly.   Cardiovascular:     Rate and Rhythm: Normal rate and regular rhythm.     Heart sounds: Normal heart  sounds. No murmur heard. Pulmonary:     Effort: Pulmonary effort is normal. No respiratory distress.     Breath sounds: Normal breath sounds. No wheezing.  Abdominal:     General: Bowel sounds are normal. There is no distension.     Palpations: Abdomen is soft.     Tenderness: There is no abdominal tenderness.   Musculoskeletal:        General: No tenderness. Normal range of motion.     Cervical back: Normal range of motion and neck supple.     Right lower leg: Edema (trace) present.     Left lower leg: Edema (trace) present.   Skin:    General: Skin is warm and dry.     Findings: No erythema or rash.   Neurological:     Mental Status: He is alert and oriented  to person, place, and time.     Cranial Nerves: No cranial nerve deficit.     Deep Tendon Reflexes: Reflexes are normal and symmetric.   Psychiatric:        Behavior: Behavior normal.        Thought Content: Thought content normal.        Judgment: Judgment normal.       BP 137/63   Pulse 78   Temp 98.4 F (36.9 C) (Temporal)   Ht 5' 10 (1.778 m)   Wt 134 lb (60.8 kg)   SpO2 96%   BMI 19.23 kg/m      Assessment & Plan:   Benjamin Buchanan comes in today with chief complaint of Medical Management of Chronic Issues   Diagnosis and orders addressed:  1. Annual physical exam (Primary) - CMP14+EGFR - CBC with Differential/Platelet - Lipid panel - VITAMIN D  25 Hydroxy (Vit-D Deficiency, Fractures)  2. Essential hypertension - CMP14+EGFR - CBC with Differential/Platelet  3. Mixed hyperlipidemia - CMP14+EGFR - CBC with Differential/Platelet - Lipid panel  4. Thoracic aortic atherosclerosis (HCC) - CMP14+EGFR - CBC with Differential/Platelet  5. Spinal stenosis of lumbar region at multiple levels - CMP14+EGFR - CBC with Differential/Platelet  6. Stage 3a chronic kidney disease (HCC) - CMP14+EGFR - CBC with Differential/Platelet  7. Renal cell carcinoma of right kidney (HCC) -  CMP14+EGFR - CBC with Differential/Platelet  8. Vitamin D  deficiency - CMP14+EGFR - CBC with Differential/Platelet - VITAMIN D  25 Hydroxy (Vit-D Deficiency, Fractures)  Continue current medications  Labs pending Health Maintenance reviewed Diet and exercise encouraged  Follow up plan: 4 months    Tommas Fragmin, FNP

## 2023-06-30 ENCOUNTER — Ambulatory Visit: Payer: Self-pay | Admitting: Family

## 2023-06-30 LAB — CMP14+EGFR
ALT: 16 IU/L (ref 0–44)
AST: 21 IU/L (ref 0–40)
Albumin: 4 g/dL (ref 3.6–4.6)
Alkaline Phosphatase: 68 IU/L (ref 44–121)
BUN/Creatinine Ratio: 19 (ref 10–24)
BUN: 22 mg/dL (ref 10–36)
Bilirubin Total: 0.3 mg/dL (ref 0.0–1.2)
CO2: 19 mmol/L — ABNORMAL LOW (ref 20–29)
Calcium: 8.8 mg/dL (ref 8.6–10.2)
Chloride: 108 mmol/L — ABNORMAL HIGH (ref 96–106)
Creatinine, Ser: 1.15 mg/dL (ref 0.76–1.27)
Globulin, Total: 1.8 g/dL (ref 1.5–4.5)
Glucose: 102 mg/dL — ABNORMAL HIGH (ref 70–99)
Potassium: 4.5 mmol/L (ref 3.5–5.2)
Sodium: 142 mmol/L (ref 134–144)
Total Protein: 5.8 g/dL — ABNORMAL LOW (ref 6.0–8.5)
eGFR: 59 mL/min/{1.73_m2} — ABNORMAL LOW (ref >59)

## 2023-06-30 LAB — CBC WITH DIFFERENTIAL/PLATELET
Basophils Absolute: 0 10*3/uL (ref 0.0–0.2)
Basos: 1 %
EOS (ABSOLUTE): 0.2 10*3/uL (ref 0.0–0.4)
Eos: 2 %
Hematocrit: 35.7 % — ABNORMAL LOW (ref 37.5–51.0)
Hemoglobin: 11 g/dL — ABNORMAL LOW (ref 13.0–17.7)
Immature Grans (Abs): 0 10*3/uL (ref 0.0–0.1)
Immature Granulocytes: 0 %
Lymphocytes Absolute: 1 10*3/uL (ref 0.7–3.1)
Lymphs: 13 %
MCH: 30.1 pg (ref 26.6–33.0)
MCHC: 30.8 g/dL — ABNORMAL LOW (ref 31.5–35.7)
MCV: 98 fL — ABNORMAL HIGH (ref 79–97)
Monocytes Absolute: 0.4 10*3/uL (ref 0.1–0.9)
Monocytes: 6 %
Neutrophils Absolute: 5.8 10*3/uL (ref 1.4–7.0)
Neutrophils: 78 %
Platelets: 269 10*3/uL (ref 150–450)
RBC: 3.66 x10E6/uL — ABNORMAL LOW (ref 4.14–5.80)
RDW: 12.4 % (ref 11.6–15.4)
WBC: 7.4 10*3/uL (ref 3.4–10.8)

## 2023-06-30 LAB — VITAMIN D 25 HYDROXY (VIT D DEFICIENCY, FRACTURES): Vit D, 25-Hydroxy: 37.3 ng/mL (ref 30.0–100.0)

## 2023-06-30 LAB — LIPID PANEL
Chol/HDL Ratio: 1.8 ratio (ref 0.0–5.0)
Cholesterol, Total: 132 mg/dL (ref 100–199)
HDL: 72 mg/dL (ref >39)
LDL Chol Calc (NIH): 46 mg/dL (ref 0–99)
Triglycerides: 67 mg/dL (ref 0–149)
VLDL Cholesterol Cal: 14 mg/dL (ref 5–40)

## 2023-07-02 LAB — IRON AND TIBC
Iron Saturation: 37 % (ref 15–55)
Iron: 81 ug/dL (ref 38–169)
Total Iron Binding Capacity: 218 ug/dL — ABNORMAL LOW (ref 250–450)
UIBC: 137 ug/dL (ref 111–343)

## 2023-07-02 LAB — B12 AND FOLATE PANEL
Folate: 16.5 ng/mL (ref >3.0)
Vitamin B-12: 208 pg/mL — ABNORMAL LOW (ref 232–1245)

## 2023-07-02 LAB — SPECIMEN STATUS REPORT

## 2023-07-06 ENCOUNTER — Other Ambulatory Visit: Payer: Self-pay | Admitting: Family

## 2023-07-06 DIAGNOSIS — E538 Deficiency of other specified B group vitamins: Secondary | ICD-10-CM | POA: Insufficient documentation

## 2023-07-07 ENCOUNTER — Other Ambulatory Visit: Payer: Self-pay | Admitting: Family Medicine

## 2023-07-07 ENCOUNTER — Ambulatory Visit (INDEPENDENT_AMBULATORY_CARE_PROVIDER_SITE_OTHER)

## 2023-07-07 DIAGNOSIS — E538 Deficiency of other specified B group vitamins: Secondary | ICD-10-CM

## 2023-07-07 MED ORDER — CYANOCOBALAMIN 1000 MCG/ML IJ SOLN
1000.0000 ug | Freq: Once | INTRAMUSCULAR | Status: AC
  Administered 2023-07-07: 1000 ug via INTRAMUSCULAR

## 2023-07-07 NOTE — Progress Notes (Signed)
 Patient is in office today for a nurse visit for B12 Injection. Patient Injection was given in the  Left deltoid. Patient tolerated injection well.

## 2023-07-16 ENCOUNTER — Other Ambulatory Visit: Payer: Self-pay | Admitting: Family

## 2023-07-16 DIAGNOSIS — I10 Essential (primary) hypertension: Secondary | ICD-10-CM

## 2023-08-01 ENCOUNTER — Other Ambulatory Visit: Payer: Self-pay | Admitting: Family

## 2023-08-01 DIAGNOSIS — I10 Essential (primary) hypertension: Secondary | ICD-10-CM

## 2023-08-06 ENCOUNTER — Ambulatory Visit (INDEPENDENT_AMBULATORY_CARE_PROVIDER_SITE_OTHER)

## 2023-08-06 DIAGNOSIS — E538 Deficiency of other specified B group vitamins: Secondary | ICD-10-CM | POA: Diagnosis not present

## 2023-08-06 MED ORDER — CYANOCOBALAMIN 1000 MCG/ML IJ SOLN
1000.0000 ug | INTRAMUSCULAR | Status: AC
Start: 2023-08-06 — End: ?
  Administered 2023-08-06 – 2024-02-18 (×7): 1000 ug via INTRAMUSCULAR

## 2023-08-06 NOTE — Progress Notes (Signed)
 Patient is in office today for a nurse visit for B12 Injection. Patient Injection was given in the  Right deltoid. Patient tolerated injection well.

## 2023-09-08 ENCOUNTER — Ambulatory Visit (INDEPENDENT_AMBULATORY_CARE_PROVIDER_SITE_OTHER): Admitting: *Deleted

## 2023-09-08 DIAGNOSIS — E538 Deficiency of other specified B group vitamins: Secondary | ICD-10-CM | POA: Diagnosis not present

## 2023-09-08 NOTE — Progress Notes (Signed)
 Patient is in office today for a nurse visit for B12 Injection. Patient Injection was given in the  Left deltoid. Patient tolerated injection well.

## 2023-10-09 ENCOUNTER — Ambulatory Visit (INDEPENDENT_AMBULATORY_CARE_PROVIDER_SITE_OTHER): Admitting: *Deleted

## 2023-10-09 DIAGNOSIS — E538 Deficiency of other specified B group vitamins: Secondary | ICD-10-CM | POA: Diagnosis not present

## 2023-10-09 NOTE — Progress Notes (Signed)
 Patient is in office today for a nurse visit for B12 Injection. Patient Injection was given in the  Right deltoid. Patient tolerated injection well.

## 2023-10-16 ENCOUNTER — Ambulatory Visit (HOSPITAL_COMMUNITY): Payer: Medicare HMO

## 2023-10-29 ENCOUNTER — Ambulatory Visit: Payer: Medicare HMO | Admitting: Urology

## 2023-10-30 ENCOUNTER — Ambulatory Visit: Admitting: Family

## 2023-10-30 ENCOUNTER — Encounter: Payer: Self-pay | Admitting: Family

## 2023-10-30 VITALS — BP 160/75 | HR 70 | Temp 97.3°F | Ht 70.0 in | Wt 133.0 lb

## 2023-10-30 DIAGNOSIS — C641 Malignant neoplasm of right kidney, except renal pelvis: Secondary | ICD-10-CM

## 2023-10-30 DIAGNOSIS — I700 Atherosclerosis of aorta: Secondary | ICD-10-CM | POA: Diagnosis not present

## 2023-10-30 DIAGNOSIS — N1831 Chronic kidney disease, stage 3a: Secondary | ICD-10-CM

## 2023-10-30 DIAGNOSIS — M48061 Spinal stenosis, lumbar region without neurogenic claudication: Secondary | ICD-10-CM

## 2023-10-30 DIAGNOSIS — Z23 Encounter for immunization: Secondary | ICD-10-CM

## 2023-10-30 DIAGNOSIS — E782 Mixed hyperlipidemia: Secondary | ICD-10-CM | POA: Diagnosis not present

## 2023-10-30 DIAGNOSIS — E538 Deficiency of other specified B group vitamins: Secondary | ICD-10-CM | POA: Diagnosis not present

## 2023-10-30 DIAGNOSIS — I10 Essential (primary) hypertension: Secondary | ICD-10-CM | POA: Diagnosis not present

## 2023-10-30 DIAGNOSIS — M5137 Other intervertebral disc degeneration, lumbosacral region with discogenic back pain only: Secondary | ICD-10-CM | POA: Diagnosis not present

## 2023-10-30 DIAGNOSIS — E559 Vitamin D deficiency, unspecified: Secondary | ICD-10-CM

## 2023-10-30 DIAGNOSIS — Z85828 Personal history of other malignant neoplasm of skin: Secondary | ICD-10-CM

## 2023-10-30 LAB — CBC WITH DIFFERENTIAL/PLATELET
Basophils Absolute: 0 x10E3/uL (ref 0.0–0.2)
Basos: 1 %
EOS (ABSOLUTE): 0.2 x10E3/uL (ref 0.0–0.4)
Eos: 3 %
Hematocrit: 35.9 % — ABNORMAL LOW (ref 37.5–51.0)
Hemoglobin: 11.4 g/dL — ABNORMAL LOW (ref 13.0–17.7)
Immature Grans (Abs): 0 x10E3/uL (ref 0.0–0.1)
Immature Granulocytes: 0 %
Lymphocytes Absolute: 1.2 x10E3/uL (ref 0.7–3.1)
Lymphs: 19 %
MCH: 31.4 pg (ref 26.6–33.0)
MCHC: 31.8 g/dL (ref 31.5–35.7)
MCV: 99 fL — ABNORMAL HIGH (ref 79–97)
Monocytes Absolute: 0.4 x10E3/uL (ref 0.1–0.9)
Monocytes: 6 %
Neutrophils Absolute: 4.4 x10E3/uL (ref 1.4–7.0)
Neutrophils: 71 %
Platelets: 294 x10E3/uL (ref 150–450)
RBC: 3.63 x10E6/uL — ABNORMAL LOW (ref 4.14–5.80)
RDW: 12.3 % (ref 11.6–15.4)
WBC: 6.2 x10E3/uL (ref 3.4–10.8)

## 2023-10-30 LAB — CMP14+EGFR
ALT: 14 IU/L (ref 0–44)
AST: 19 IU/L (ref 0–40)
Albumin: 3.9 g/dL (ref 3.6–4.6)
Alkaline Phosphatase: 70 IU/L (ref 48–129)
BUN/Creatinine Ratio: 17 (ref 10–24)
BUN: 24 mg/dL (ref 10–36)
Bilirubin Total: 0.4 mg/dL (ref 0.0–1.2)
CO2: 20 mmol/L (ref 20–29)
Calcium: 9.3 mg/dL (ref 8.6–10.2)
Chloride: 110 mmol/L — ABNORMAL HIGH (ref 96–106)
Creatinine, Ser: 1.4 mg/dL — ABNORMAL HIGH (ref 0.76–1.27)
Globulin, Total: 2 g/dL (ref 1.5–4.5)
Glucose: 150 mg/dL — ABNORMAL HIGH (ref 70–99)
Potassium: 4.5 mmol/L (ref 3.5–5.2)
Sodium: 143 mmol/L (ref 134–144)
Total Protein: 5.9 g/dL — ABNORMAL LOW (ref 6.0–8.5)
eGFR: 47 mL/min/1.73 — ABNORMAL LOW (ref >59)

## 2023-10-30 NOTE — Patient Instructions (Signed)
 Health Maintenance After Age 88 After age 27, you are at a higher risk for certain long-term diseases and infections as well as injuries from falls. Falls are a major cause of broken bones and head injuries in people who are older than age 73. Getting regular preventive care can help to keep you healthy and well. Preventive care includes getting regular testing and making lifestyle changes as recommended by your health care provider. Talk with your health care provider about: Which screenings and tests you should have. A screening is a test that checks for a disease when you have no symptoms. A diet and exercise plan that is right for you. What should I know about screenings and tests to prevent falls? Screening and testing are the best ways to find a health problem early. Early diagnosis and treatment give you the best chance of managing medical conditions that are common after age 90. Certain conditions and lifestyle choices may make you more likely to have a fall. Your health care provider may recommend: Regular vision checks. Poor vision and conditions such as cataracts can make you more likely to have a fall. If you wear glasses, make sure to get your prescription updated if your vision changes. Medicine review. Work with your health care provider to regularly review all of the medicines you are taking, including over-the-counter medicines. Ask your health care provider about any side effects that may make you more likely to have a fall. Tell your health care provider if any medicines that you take make you feel dizzy or sleepy. Strength and balance checks. Your health care provider may recommend certain tests to check your strength and balance while standing, walking, or changing positions. Foot health exam. Foot pain and numbness, as well as not wearing proper footwear, can make you more likely to have a fall. Screenings, including: Osteoporosis screening. Osteoporosis is a condition that causes  the bones to get weaker and break more easily. Blood pressure screening. Blood pressure changes and medicines to control blood pressure can make you feel dizzy. Depression screening. You may be more likely to have a fall if you have a fear of falling, feel depressed, or feel unable to do activities that you used to do. Alcohol  use screening. Using too much alcohol  can affect your balance and may make you more likely to have a fall. Follow these instructions at home: Lifestyle Do not drink alcohol  if: Your health care provider tells you not to drink. If you drink alcohol : Limit how much you have to: 0-1 drink a day for women. 0-2 drinks a day for men. Know how much alcohol  is in your drink. In the [place] one drink equals one 12 oz bottle of beer (355 mL), one 5 oz glass of wine (148 mL), or one 1 oz glass of hard liquor (44 mL). Do not use any products that contain nicotine or tobacco. These products include cigarettes, chewing tobacco, and vaping devices, such as e-cigarettes. If you need help quitting, ask your health care provider. Activity  Follow a regular exercise program to stay fit. This will help you maintain your balance. Ask your health care provider what types of exercise are appropriate for you. If you need a cane or walker, use it as recommended by your health care provider. Wear supportive shoes that have nonskid soles. Safety  Remove any tripping hazards, such as rugs, cords, and clutter. Install safety equipment such as grab bars in bathrooms and safety rails on stairs. Keep rooms and walkways  well-lit. General instructions Talk with your health care provider about your risks for falling. Tell your health care provider if: You fall. Be sure to tell your health care provider about all falls, even ones that seem minor. You feel dizzy, tiredness (fatigue), or off-balance. Take over-the-counter and prescription medicines only as told by your health care provider. These include  supplements. Eat a healthy diet and maintain a healthy weight. A healthy diet includes low-fat dairy products, low-fat (lean) meats, and fiber from whole grains, beans, and lots of fruits and vegetables. Stay current with your vaccines. Schedule regular health, dental, and eye exams. Summary Having a healthy lifestyle and getting preventive care can help to protect your health and wellness after age 15. Screening and testing are the best way to find a health problem early and help you avoid having a fall. Early diagnosis and treatment give you the best chance for managing medical conditions that are more common for people who are older than age 42. Falls are a major cause of broken bones and head injuries in people who are older than age 64. Take precautions to prevent a fall at home. Work with your health care provider to learn what changes you can make to improve your health and wellness and to prevent falls. This information is not intended to replace advice given to you by your health care provider. Make sure you discuss any questions you have with your health care provider. Document Revised: 05/21/2020 Document Reviewed: 05/21/2020 Elsevier Patient Education  2024 [employer].

## 2023-10-30 NOTE — Progress Notes (Signed)
 Subjective:    [name], male    DOB: 01/14/1928, 89 y.o.   MRN: [id number]  Chief Complaint  Patient presents with   Medical Management of Chronic Issues   PT presents to the office today for chronic follow up.     He is followed by Urologists for renal lesion. His MRI on 01/16/23 showed, 1. Unchanged small enhancing masses arising from the anterior inferior pole of the right kidney measuring 1.3 x 1.0 cm, and the posterior inferior pole of the right kidney measuring 1.2 x 0.8 cm. These are consistent with small renal cell carcinomas, generally of doubtful clinical significance given small size and very advanced patient age. 2. No evidence of renal vein invasion, lymphadenopathy, or metastatic disease in the abdomen. 3. Unchanged aneurysmal dilatation of the portal vein measuring 5.5 x 3.8 cm. 4. Large burden of stool in the colon.    He is followed by dermatologists for  hx basal cell cancer on his nose and chest.   Has thoracic aortic atherosclerosis and taking Crestor  5 mg daily.    Has CKD and avoids NSAIDs.  Hypertension This is a chronic problem. The current episode started more than 1 year ago. The problem has been waxing and waning since onset. The problem is uncontrolled. Associated symptoms include anxiety. Pertinent negatives include no malaise/fatigue, palpitations, peripheral edema or shortness of breath. Risk factors for coronary artery disease include dyslipidemia and male gender. The current treatment provides moderate improvement.  Hyperlipidemia This is a chronic problem. The current episode started more than 1 year ago. The problem is controlled. Recent lipid tests were reviewed and are normal. Pertinent negatives include no shortness of breath. Current antihyperlipidemic treatment includes diet change and statins. The current treatment provides moderate improvement of lipids. Risk factors for coronary artery disease include dyslipidemia,  hypertension, a sedentary lifestyle and male sex.  Anxiety Presents for follow-up visit. Symptoms include excessive worry and nervous/anxious behavior. Patient reports no decreased concentration, irritability, palpitations, panic or shortness of breath. Symptoms occur rarely. The severity of symptoms is mild.    Back Pain This is a chronic problem. The current episode started more than 1 year ago. The problem occurs intermittently. The problem has been waxing and waning since onset. The pain is present in the lumbar spine. The quality of the pain is described as aching. The pain is at a severity of 9/10 (in the mornings, but after walking it improves to 4). The pain is mild. The symptoms are aggravated by standing and lying down. He has tried bed rest and home exercises for the symptoms. The treatment provided mild relief.      Review of Systems  Constitutional:  Negative for irritability and malaise/fatigue.  Respiratory:  Negative for shortness of breath.   Cardiovascular:  Negative for palpitations.  Musculoskeletal:  Positive for back pain.  Psychiatric/Behavioral:  Negative for decreased concentration. The patient is nervous/anxious.   All other systems reviewed and are negative.  Family History  Problem Relation Age of Onset   Cancer Father        prostate cancer   Hypertension Other        family history   Social History   Socioeconomic History   Marital status: Widowed    Spouse name: Not on file   Number of children: 2   Years of education: 10   Highest education level: 10th grade  Occupational History   Occupation: retired  Tobacco Use   Smoking status:  Never   Smokeless tobacco: Never  Vaping Use   Vaping status: Never Used  Substance and Sexual Activity   Alcohol use: No   Drug use: No   Sexual activity: Not Currently  Other Topics Concern   Not on file  Social History Narrative   Retired. Lives alone. No family nearby, but he eats out for breakfast and  dinner with friends several times per week. Stays active. Still very independent 11/2020   Social Drivers of Health   Financial Resource Strain: Low Risk  (01/27/2022)   Overall Financial Resource Strain (CARDIA)    Difficulty of Paying Living Expenses: Not hard at all  Food Insecurity: No Food Insecurity (07/21/2022)   Hunger Vital Sign    Worried About Running Out of Food in the Last Year: Never true    Ran Out of Food in the Last Year: Never true  Transportation Needs: No Transportation Needs (07/21/2022)   PRAPARE - Administrator, Civil Service (Medical): No    Lack of Transportation (Non-Medical): No  Physical Activity: Inactive (01/27/2022)   Exercise Vital Sign    Days of Exercise per Week: 0 days    Minutes of Exercise per Session: 0 min  Stress: No Stress Concern Present (01/27/2022)   [employer] of Occupational Health - Occupational Stress Questionnaire    Feeling of Stress : Not at all  Social Connections: Moderately Isolated (01/27/2022)   Social Connection and Isolation Panel    Frequency of Communication with Friends and Family: More than three times a week    Frequency of Social Gatherings with Friends and Family: Three times a week    Attends Religious Services: Never    Active Member of Clubs or Organizations: Yes    Attends Banker Meetings: More than 4 times per year    Marital Status: Widowed        Objective:   Physical Exam Vitals reviewed.  Constitutional:      General: He is not in acute distress.    Appearance: He is well-developed.  HENT:     Head: Normocephalic.     Right Ear: Tympanic membrane normal.     Left Ear: Tympanic membrane normal.  Eyes:     General:        Right eye: No discharge.        Left eye: No discharge.     Pupils: Pupils are equal, round, and reactive to light.  Neck:     Thyroid : No thyromegaly.  Cardiovascular:     Rate and Rhythm: Normal rate and regular rhythm.     Heart sounds: Normal  heart sounds. No murmur heard. Pulmonary:     Effort: Pulmonary effort is normal. No respiratory distress.     Breath sounds: Normal breath sounds. No wheezing.  Abdominal:     General: Bowel sounds are normal. There is no distension.     Palpations: Abdomen is soft.     Tenderness: There is no abdominal tenderness.  Musculoskeletal:        General: No tenderness. Normal range of motion.     Cervical back: Normal range of motion and neck supple.     Right lower leg: Edema (trace) present.     Left lower leg: Edema (trace) present.  Skin:    General: Skin is warm and dry.     Findings: No erythema or rash.  Neurological:     Mental Status: He is alert and oriented to person, place,  and time.     Cranial Nerves: No cranial nerve deficit.     Deep Tendon Reflexes: Reflexes are normal and symmetric.  Psychiatric:        Behavior: Behavior normal.        Thought Content: Thought content normal.        Judgment: Judgment normal.       BP (!) 160/75   Pulse 70   Temp (!) 97.3 F (36.3 C) (Temporal)   Ht 5' 10 (1.778 m)   Wt 133 lb (60.3 kg)   SpO2 96%   BMI 19.08 kg/m      Assessment & Plan:   Younes Degeorge comes in today with chief complaint of Medical Management of Chronic Issues   Diagnosis and orders addressed:  1. Encounter for immunization (Primary) - Flu vaccine HIGH DOSE PF(Fluzone Trivalent)  2. Degeneration of intervertebral disc of lumbosacral region with discogenic back pain - CMP14+EGFR - CBC with Differential/Platelet  3. Essential hypertension - CMP14+EGFR - CBC with Differential/Platelet  4. Mixed hyperlipidemia - CMP14+EGFR - CBC with Differential/Platelet  5. Vitamin D  deficiency - CMP14+EGFR - CBC with Differential/Platelet  6. Vitamin B 12 deficiency - CMP14+EGFR - CBC with Differential/Platelet  7. Thoracic aortic atherosclerosis - CMP14+EGFR - CBC with Differential/Platelet  8. Stage 3a chronic kidney disease (HCC) -  CMP14+EGFR - CBC with Differential/Platelet  9. Spinal stenosis of lumbar region at multiple levels - CMP14+EGFR - CBC with Differential/Platelet  10. Renal cell carcinoma of right kidney (HCC) - CMP14+EGFR - CBC with Differential/Platelet  11. Hx of skin cancer, basal cell - CMP14+EGFR - CBC with Differential/Platelet   Continue current medications  Labs pending Health Maintenance reviewed Diet and exercise encouraged  Follow up plan: 4 months    Bari Learn, FNP

## 2023-11-02 ENCOUNTER — Ambulatory Visit: Payer: Self-pay | Admitting: Family

## 2023-11-02 DIAGNOSIS — R7989 Other specified abnormal findings of blood chemistry: Secondary | ICD-10-CM

## 2023-11-04 ENCOUNTER — Other Ambulatory Visit

## 2023-11-04 DIAGNOSIS — R7989 Other specified abnormal findings of blood chemistry: Secondary | ICD-10-CM

## 2023-11-04 LAB — BMP8+EGFR
BUN/Creatinine Ratio: 15 (ref 10–24)
BUN: 20 mg/dL (ref 10–36)
CO2: 20 mmol/L (ref 20–29)
Calcium: 8.8 mg/dL (ref 8.6–10.2)
Chloride: 103 mmol/L (ref 96–106)
Creatinine, Ser: 1.32 mg/dL — ABNORMAL HIGH (ref 0.76–1.27)
Glucose: 121 mg/dL — ABNORMAL HIGH (ref 70–99)
Potassium: 4 mmol/L (ref 3.5–5.2)
Sodium: 138 mmol/L (ref 134–144)
eGFR: 50 mL/min/1.73 — ABNORMAL LOW (ref >59)

## 2023-11-05 ENCOUNTER — Other Ambulatory Visit: Payer: Self-pay | Admitting: Family

## 2023-11-05 ENCOUNTER — Ambulatory Visit: Payer: Self-pay | Admitting: Family

## 2023-11-05 DIAGNOSIS — I10 Essential (primary) hypertension: Secondary | ICD-10-CM

## 2023-11-09 ENCOUNTER — Ambulatory Visit (INDEPENDENT_AMBULATORY_CARE_PROVIDER_SITE_OTHER)

## 2023-11-09 DIAGNOSIS — E538 Deficiency of other specified B group vitamins: Secondary | ICD-10-CM | POA: Diagnosis not present

## 2023-11-09 NOTE — Progress Notes (Signed)
 Patient is in office today for a nurse visit for B12 Injection. Patient Injection was given in the  Left deltoid. Patient tolerated injection well.

## 2023-12-04 ENCOUNTER — Other Ambulatory Visit: Payer: Self-pay | Admitting: Family

## 2023-12-04 DIAGNOSIS — I700 Atherosclerosis of aorta: Secondary | ICD-10-CM

## 2023-12-14 ENCOUNTER — Ambulatory Visit (INDEPENDENT_AMBULATORY_CARE_PROVIDER_SITE_OTHER): Payer: Self-pay | Admitting: *Deleted

## 2023-12-14 DIAGNOSIS — E538 Deficiency of other specified B group vitamins: Secondary | ICD-10-CM | POA: Diagnosis not present

## 2023-12-14 NOTE — Progress Notes (Signed)
 Patient is in office today for a nurse visit for B12 Injection.  Injection was given in the  Right deltoid. Patient tolerated injection well.

## 2024-01-18 ENCOUNTER — Ambulatory Visit (INDEPENDENT_AMBULATORY_CARE_PROVIDER_SITE_OTHER): Admitting: *Deleted

## 2024-01-18 DIAGNOSIS — E538 Deficiency of other specified B group vitamins: Secondary | ICD-10-CM

## 2024-01-18 NOTE — Progress Notes (Signed)
 Patient is in office today for a nurse visit for B12 Injection. Patient Injection was given in the  Left deltoid. Patient tolerated injection well.

## 2024-01-27 ENCOUNTER — Other Ambulatory Visit: Payer: Self-pay | Admitting: Family

## 2024-01-27 DIAGNOSIS — I10 Essential (primary) hypertension: Secondary | ICD-10-CM

## 2024-01-29 ENCOUNTER — Telehealth: Payer: Self-pay

## 2024-01-29 DIAGNOSIS — I10 Essential (primary) hypertension: Secondary | ICD-10-CM

## 2024-01-29 NOTE — Telephone Encounter (Signed)
 Informed them that refill was sent to [facility] for 3 mos supply on 01/27/24

## 2024-01-29 NOTE — Telephone Encounter (Signed)
 Copied from CRM [id number]. Topic: Clinical - Medication Refill >> Jan 29, 2024  1:27 PM Benjamin Buchanan wrote: Medication:  amLODipine  (NORVASC ) 10 MG tablet-   Has the patient contacted their pharmacy? Yes (Agent: If no, request that the patient contact the pharmacy for the refill. If patient does not wish to contact the pharmacy document the reason why and proceed with request.) (Agent: If yes, when and what did the pharmacy advise?)  This is the patient's preferred pharmacy:  [id number] - [place], [place] - [address] [place] [address] Phone: [phone] Fax: [phone]  Is this the correct pharmacy for this prescription? Yes If no, delete pharmacy and type the correct one.   Has the prescription been filled recently? Yes  Is the patient out of the medication? Yes  Has the patient been seen for an appointment in the last year OR does the patient have an upcoming appointment? Yes  Can we respond through MyChart? Yes  Agent: Please be advised that Rx refills may take up to 3 business days. We ask that you follow-up with your pharmacy.

## 2024-02-15 ENCOUNTER — Ambulatory Visit: Admitting: Urology

## 2024-02-18 ENCOUNTER — Ambulatory Visit (INDEPENDENT_AMBULATORY_CARE_PROVIDER_SITE_OTHER): Admitting: *Deleted

## 2024-02-18 DIAGNOSIS — E538 Deficiency of other specified B group vitamins: Secondary | ICD-10-CM

## 2024-02-18 NOTE — Progress Notes (Signed)
 Patient is in office today for a nurse visit for B12 Injection. Patient Injection was given in the  Right deltoid. Patient tolerated injection well.

## 2024-03-01 ENCOUNTER — Ambulatory Visit: Payer: Self-pay | Admitting: Family

## 2024-03-17 ENCOUNTER — Ambulatory Visit

## 2024-04-29 ENCOUNTER — Ambulatory Visit: Admitting: Urology
